# Patient Record
Sex: Female | Born: 1970 | Race: White | Hispanic: No | Marital: Married | Smoking: Former smoker
Health system: Southern US, Community
[De-identification: ages and names within clinical notes are randomized; demographics above are authoritative.]

## PROBLEM LIST (undated history)

## (undated) DIAGNOSIS — K219 Gastro-esophageal reflux disease without esophagitis: Secondary | ICD-10-CM

## (undated) DIAGNOSIS — T7840XA Allergy, unspecified, initial encounter: Secondary | ICD-10-CM

## (undated) DIAGNOSIS — F419 Anxiety disorder, unspecified: Secondary | ICD-10-CM

## (undated) DIAGNOSIS — F32A Depression, unspecified: Secondary | ICD-10-CM

## (undated) DIAGNOSIS — M5416 Radiculopathy, lumbar region: Secondary | ICD-10-CM

## (undated) DIAGNOSIS — R296 Repeated falls: Secondary | ICD-10-CM

## (undated) DIAGNOSIS — G8929 Other chronic pain: Secondary | ICD-10-CM

## (undated) DIAGNOSIS — R519 Headache, unspecified: Secondary | ICD-10-CM

## (undated) DIAGNOSIS — K760 Fatty (change of) liver, not elsewhere classified: Secondary | ICD-10-CM

## (undated) HISTORY — PX: ABDOMINAL HYSTERECTOMY: SHX81

## (undated) HISTORY — DX: Allergy, unspecified, initial encounter: T78.40XA

## (undated) HISTORY — PX: COLPOSCOPY: SHX161

## (undated) HISTORY — PX: FOOT SURGERY: SHX648

## (undated) HISTORY — PX: TUBAL LIGATION: SHX77

---

## 2009-05-01 DIAGNOSIS — C539 Malignant neoplasm of cervix uteri, unspecified: Secondary | ICD-10-CM

## 2009-05-01 HISTORY — DX: Malignant neoplasm of cervix uteri, unspecified: C53.9

## 2012-04-15 ENCOUNTER — Emergency Department: Payer: Self-pay | Admitting: Emergency Medicine

## 2015-07-22 ENCOUNTER — Emergency Department
Admission: EM | Admit: 2015-07-22 | Discharge: 2015-07-22 | Disposition: A | Discharge status: Home / Self Care | Payer: Self-pay | Attending: Emergency Medicine | Admitting: Emergency Medicine

## 2015-07-22 ENCOUNTER — Emergency Department: Payer: Self-pay

## 2015-07-22 ENCOUNTER — Encounter: Payer: Self-pay | Admitting: Emergency Medicine

## 2015-07-22 DIAGNOSIS — F17200 Nicotine dependence, unspecified, uncomplicated: Secondary | ICD-10-CM | POA: Insufficient documentation

## 2015-07-22 DIAGNOSIS — Z3202 Encounter for pregnancy test, result negative: Secondary | ICD-10-CM | POA: Insufficient documentation

## 2015-07-22 DIAGNOSIS — Z880 Allergy status to penicillin: Secondary | ICD-10-CM | POA: Insufficient documentation

## 2015-07-22 DIAGNOSIS — B349 Viral infection, unspecified: Secondary | ICD-10-CM | POA: Insufficient documentation

## 2015-07-22 LAB — CBC
HEMATOCRIT: 45.7 % (ref 35.0–47.0)
Hemoglobin: 15.5 g/dL (ref 12.0–16.0)
MCH: 32.8 pg (ref 26.0–34.0)
MCHC: 34 g/dL (ref 32.0–36.0)
MCV: 96.6 fL (ref 80.0–100.0)
PLATELETS: 177 10*3/uL (ref 150–440)
RBC: 4.73 MIL/uL (ref 3.80–5.20)
RDW: 13.9 % (ref 11.5–14.5)
WBC: 6.6 10*3/uL (ref 3.6–11.0)

## 2015-07-22 LAB — URINALYSIS COMPLETE WITH MICROSCOPIC (ARMC ONLY)
BACTERIA UA: NONE SEEN
BILIRUBIN URINE: NEGATIVE
GLUCOSE, UA: NEGATIVE mg/dL
LEUKOCYTES UA: NEGATIVE
NITRITE: NEGATIVE
Protein, ur: NEGATIVE mg/dL
SPECIFIC GRAVITY, URINE: 1.019 (ref 1.005–1.030)
pH: 5 (ref 5.0–8.0)

## 2015-07-22 LAB — RAPID INFLUENZA A&B ANTIGENS: Influenza A (ARMC): NEGATIVE

## 2015-07-22 LAB — COMPREHENSIVE METABOLIC PANEL
ALK PHOS: 36 U/L — AB (ref 38–126)
ALT: 22 U/L (ref 14–54)
AST: 26 U/L (ref 15–41)
Albumin: 4.7 g/dL (ref 3.5–5.0)
Anion gap: 7 (ref 5–15)
BILIRUBIN TOTAL: 0.7 mg/dL (ref 0.3–1.2)
BUN: 12 mg/dL (ref 6–20)
CALCIUM: 8.9 mg/dL (ref 8.9–10.3)
CO2: 22 mmol/L (ref 22–32)
Chloride: 106 mmol/L (ref 101–111)
Creatinine, Ser: 0.63 mg/dL (ref 0.44–1.00)
GFR calc Af Amer: >60 mL/min (ref >60)
GLUCOSE: 102 mg/dL — AB (ref 65–99)
POTASSIUM: 3.2 mmol/L — AB (ref 3.5–5.1)
Sodium: 135 mmol/L (ref 135–145)
TOTAL PROTEIN: 8.7 g/dL — AB (ref 6.5–8.1)

## 2015-07-22 LAB — RAPID INFLUENZA A&B ANTIGENS (ARMC ONLY): INFLUENZA B (ARMC): NEGATIVE

## 2015-07-22 LAB — POCT PREGNANCY, URINE: Preg Test, Ur: NEGATIVE

## 2015-07-22 LAB — LIPASE, BLOOD: Lipase: 19 U/L (ref 11–51)

## 2015-07-22 MED ORDER — MORPHINE SULFATE (PF) 4 MG/ML IV SOLN
4.0000 mg | Freq: Once | INTRAVENOUS | Status: AC
  Administered 2015-07-22: 4 mg via INTRAVENOUS
  Filled 2015-07-22: qty 1

## 2015-07-22 MED ORDER — ONDANSETRON HCL 4 MG/2ML IJ SOLN
4.0000 mg | Freq: Once | INTRAMUSCULAR | Status: AC
  Administered 2015-07-22: 4 mg via INTRAVENOUS
  Filled 2015-07-22: qty 2

## 2015-07-22 MED ORDER — SODIUM CHLORIDE 0.9 % IV SOLN
Freq: Once | INTRAVENOUS | Status: AC
  Administered 2015-07-22: 12:00:00 via INTRAVENOUS

## 2015-07-22 MED ORDER — PROMETHAZINE HCL 25 MG PO TABS: 25.0000 mg | ORAL_TABLET | Freq: Four times a day (QID) | ORAL | Status: DC | PRN

## 2015-07-22 MED ORDER — KETOROLAC TROMETHAMINE 30 MG/ML IJ SOLN
30.0000 mg | Freq: Once | INTRAMUSCULAR | Status: AC
  Administered 2015-07-22: 30 mg via INTRAVENOUS
  Filled 2015-07-22: qty 1

## 2015-07-22 MED ORDER — IBUPROFEN 800 MG PO TABS: 800.0000 mg | ORAL_TABLET | Freq: Three times a day (TID) | ORAL | Status: DC | PRN

## 2015-07-22 NOTE — Discharge Instructions (Signed)
Viral Infections °A viral infection can be caused by different types of viruses. Most viral infections are not serious and resolve on their own. However, some infections may cause severe symptoms and may lead to further complications. °SYMPTOMS °Viruses can frequently cause: °· Minor sore throat. °· Aches and pains. °· Headaches. °· Runny nose. °· Different types of rashes. °· Watery eyes. °· Tiredness. °· Cough. °· Loss of appetite. °· Gastrointestinal infections, resulting in nausea, vomiting, and diarrhea. °These symptoms do not respond to antibiotics because the infection is not caused by bacteria. However, you might catch a bacterial infection following the viral infection. This is sometimes called a "superinfection." Symptoms of such a bacterial infection may include: °· Worsening sore throat with pus and difficulty swallowing. °· Swollen neck glands. °· Chills and a high or persistent fever. °· Severe headache. °· Tenderness over the sinuses. °· Persistent overall ill feeling (malaise), muscle aches, and tiredness (fatigue). °· Persistent cough. °· Yellow, green, or brown mucus production with coughing. °HOME CARE INSTRUCTIONS  °· Only take over-the-counter or prescription medicines for pain, discomfort, diarrhea, or fever as directed by your caregiver. °· Drink enough water and fluids to keep your urine clear or pale yellow. Sports drinks can provide valuable electrolytes, sugars, and hydration. °· Get plenty of rest and maintain proper nutrition. Soups and broths with crackers or rice are fine. °SEEK IMMEDIATE MEDICAL CARE IF:  °· You have severe headaches, shortness of breath, chest pain, neck pain, or an unusual rash. °· You have uncontrolled vomiting, diarrhea, or you are unable to keep down fluids. °· You or your child has an oral temperature above 102° F (38.9° C), not controlled by medicine. °· Your baby is older than 3 months with a rectal temperature of 102° F (38.9° C) or higher. °· Your baby is 3  months old or younger with a rectal temperature of 100.4° F (38° C) or higher. °MAKE SURE YOU:  °· Understand these instructions. °· Will watch your condition. °· Will get help right away if you are not doing well or get worse. °  °This information is not intended to replace advice given to you by your health care provider. Make sure you discuss any questions you have with your health care provider. °  °Document Released: 01/25/2005 Document Revised: 07/10/2011 Document Reviewed: 09/23/2014 °Elsevier Interactive Patient Education ©2016 [employer] ° °

## 2015-07-22 NOTE — ED Provider Notes (Signed)
[facility] Emergency Department Provider Note     Time seen: ----------------------------------------- 11:28 AM on 07/22/2015 -----------------------------------------    I have reviewed the triage vital signs and the nursing notes.   HISTORY  Chief Complaint Fever; Emesis; Headache; and Generalized Body Aches    HPI Erin Dorsey is a 45 y.o. female who presents ER for vomiting, fever, body aches and headaches since Monday.Patient states she's been unable to tolerate anything by mouth for several days, feels week. Headache is on the right side of her head, nothing makes it better or worse. She has had a fever up to 101, denies other complaints at this time.   History reviewed. No pertinent past medical history.  There are no active problems to display for this patient.   History reviewed. No pertinent past surgical history.  Allergies Penicillins  Social History Social History  Substance Use Topics  . Smoking status: Current Every Day Smoker  . Smokeless tobacco: None  . Alcohol Use: No    Review of Systems Constitutional: Positive for fever and aches Eyes: Negative for visual changes. ENT: Negative for sore throat. Cardiovascular: Negative for chest pain. Respiratory: Negative for shortness of breath. Positive for cough Gastrointestinal: Negative for abdominal pain, positive for vomiting and diarrhea Genitourinary: Negative for dysuria. Musculoskeletal: Negative for back pain. Skin: Negative for rash. Neurological: Positive for headache  10-point ROS otherwise negative.  ____________________________________________   PHYSICAL EXAM:  VITAL SIGNS: ED Triage Vitals  Enc Vitals Group     BP 07/22/15 0818 152/88 mmHg     Pulse Rate 07/22/15 0818 87     Resp 07/22/15 0818 20     Temp 07/22/15 0818 97.8 F (36.6 C)     Temp Source 07/22/15 0818 Oral     SpO2 07/22/15 0818 97 %     Weight 07/22/15 0818 225 lb (102.059 kg)      Height 07/22/15 0818 5\' 8"  (1.727 m)     Head Cir --      Peak Flow --      Pain Score 07/22/15 0819 9     Pain Loc --      Pain Edu? --      Excl. in [place]? --     Constitutional: Alert and oriented. Well appearing and in no distress. Eyes: Conjunctivae are normal. PERRL. Normal extraocular movements. ENT   Head: Normocephalic and atraumatic.   Nose: No congestion/rhinnorhea.   Mouth/Throat: Mucous membranes are moist.   Neck: No stridor. Cardiovascular: Normal rate, regular rhythm. Normal and symmetric distal pulses are present in all extremities. No murmurs, rubs, or gallops. Respiratory: Normal respiratory effort without tachypnea nor retractions. Breath sounds are clear and equal bilaterally. No wheezes/rales/rhonchi. Gastrointestinal: Soft and nontender. No distention. No abdominal bruits.  Musculoskeletal: Nontender with normal range of motion in all extremities. No joint effusions.  No lower extremity tenderness nor edema. Neurologic:  Normal speech and language. No gross focal neurologic deficits are appreciated.  Skin:  Skin is warm, dry and intact. No rash noted. Psychiatric: Mood and affect are normal. Speech and behavior are normal. Patient exhibits appropriate insight and judgment. ____________________________________________  ED COURSE:  Pertinent labs & imaging results that were available during my care of the patient were reviewed by me and considered in my medical decision making (see chart for details). Patient is in no acute distress, will check basic labs, give IV fluids and antiemetics. ____________________________________________    LABS (pertinent positives/negatives)  Labs Reviewed  COMPREHENSIVE  METABOLIC PANEL - Abnormal; Notable for the following:    Potassium 3.2 (*)    Glucose, Bld 102 (*)    Total Protein 8.7 (*)    Alkaline Phosphatase 36 (*)    All other components within normal limits  URINALYSIS COMPLETEWITH MICROSCOPIC ([facility]  ONLY) - Abnormal; Notable for the following:    Color, Urine YELLOW (*)    APPearance CLEAR (*)    Ketones, ur 2+ (*)    Hgb urine dipstick 2+ (*)    Squamous Epithelial / LPF 0-5 (*)    All other components within normal limits  RAPID INFLUENZA A&B ANTIGENS ([facility] ONLY)  LIPASE, BLOOD  CBC  POC URINE PREG, ED  POCT PREGNANCY, URINE    RADIOLOGY Chest x-ray Is unremarkable  ____________________________________________  FINAL ASSESSMENT AND PLAN  Viral syndrome  Plan: Patient with labs and imaging as dictated above. Patient is currently feeling better, will be discharged with Motrin, Phenergan and outpatient follow-up with her doctor.   Earleen Newport, MD   Earleen Newport, MD 07/22/15 [id number]

## 2015-07-22 NOTE — ED Notes (Signed)
Pt to ed with c/o vomiting, fever, body aches, headache since Monday.

## 2019-02-02 ENCOUNTER — Other Ambulatory Visit: Payer: Self-pay

## 2019-02-02 ENCOUNTER — Emergency Department
Admission: EM | Admit: 2019-02-02 | Discharge: 2019-02-03 | Disposition: A | Discharge status: Home / Self Care | Payer: Self-pay | Attending: Emergency Medicine | Admitting: Emergency Medicine

## 2019-02-02 DIAGNOSIS — R519 Headache, unspecified: Secondary | ICD-10-CM | POA: Insufficient documentation

## 2019-02-02 DIAGNOSIS — Z5321 Procedure and treatment not carried out due to patient leaving prior to being seen by health care provider: Secondary | ICD-10-CM | POA: Insufficient documentation

## 2019-02-02 HISTORY — DX: Headache, unspecified: R51.9

## 2019-02-02 HISTORY — DX: Other chronic pain: G89.29

## 2019-02-02 NOTE — ED Triage Notes (Addendum)
Pt to the er for headache, nausea, vomiting, a pinched nerve and pain running down her side and a cough. Pt also has left ear pain. Pt has a hx of allergies. Pt took cold medicine PTA. Pt states she does not have a MD so she is not taking any meds at this time.

## 2019-02-03 NOTE — ED Notes (Signed)
Patient called, no answer. Not seen in lobby.  

## 2019-02-03 NOTE — ED Notes (Signed)
Patient called, no answer. Patient not seen in lobby

## 2019-02-03 NOTE — ED Notes (Signed)
Patient called, no answer. Patient not seen in lobby, bathroom, or outside.  

## 2019-02-20 ENCOUNTER — Ambulatory Visit: Payer: Self-pay

## 2019-02-25 ENCOUNTER — Ambulatory Visit: Payer: Self-pay

## 2019-08-06 ENCOUNTER — Encounter: Payer: Self-pay | Admitting: Family Medicine

## 2019-08-06 ENCOUNTER — Ambulatory Visit (INDEPENDENT_AMBULATORY_CARE_PROVIDER_SITE_OTHER)
Admission: RE | Admit: 2019-08-06 | Discharge: 2019-08-06 | Disposition: A | Discharge status: Home / Self Care | Payer: BC Managed Care – PPO | Source: Ambulatory Visit | Attending: Family Medicine | Admitting: Family Medicine

## 2019-08-06 ENCOUNTER — Ambulatory Visit: Payer: BC Managed Care – PPO | Admitting: Family Medicine

## 2019-08-06 ENCOUNTER — Other Ambulatory Visit: Payer: Self-pay

## 2019-08-06 VITALS — BP 118/90 | HR 75 | Temp 98.3°F | Resp 18 | Ht 67.25 in | Wt 213.2 lb

## 2019-08-06 DIAGNOSIS — Z13220 Encounter for screening for lipoid disorders: Secondary | ICD-10-CM | POA: Diagnosis not present

## 2019-08-06 DIAGNOSIS — K148 Other diseases of tongue: Secondary | ICD-10-CM | POA: Diagnosis not present

## 2019-08-06 DIAGNOSIS — E669 Obesity, unspecified: Secondary | ICD-10-CM

## 2019-08-06 DIAGNOSIS — M25551 Pain in right hip: Secondary | ICD-10-CM

## 2019-08-06 DIAGNOSIS — Z131 Encounter for screening for diabetes mellitus: Secondary | ICD-10-CM

## 2019-08-06 DIAGNOSIS — I10 Essential (primary) hypertension: Secondary | ICD-10-CM

## 2019-08-06 DIAGNOSIS — Z23 Encounter for immunization: Secondary | ICD-10-CM

## 2019-08-06 DIAGNOSIS — Z8541 Personal history of malignant neoplasm of cervix uteri: Secondary | ICD-10-CM

## 2019-08-06 DIAGNOSIS — R221 Localized swelling, mass and lump, neck: Secondary | ICD-10-CM

## 2019-08-06 DIAGNOSIS — E66811 Obesity, class 1: Secondary | ICD-10-CM

## 2019-08-06 DIAGNOSIS — Z720 Tobacco use: Secondary | ICD-10-CM

## 2019-08-06 LAB — LIPID PANEL
Cholesterol: 149 mg/dL (ref 0–200)
HDL: 33.1 mg/dL — ABNORMAL LOW (ref >39.00)
LDL Cholesterol: 94 mg/dL (ref 0–99)
NonHDL: 116.29
Total CHOL/HDL Ratio: 5
Triglycerides: 113 mg/dL (ref 0.0–149.0)
VLDL: 22.6 mg/dL (ref 0.0–40.0)

## 2019-08-06 LAB — CBC WITH DIFFERENTIAL/PLATELET
Basophils Absolute: 0 10*3/uL (ref 0.0–0.1)
Basophils Relative: 0.8 % (ref 0.0–3.0)
Eosinophils Absolute: 0.1 10*3/uL (ref 0.0–0.7)
Eosinophils Relative: 1.1 % (ref 0.0–5.0)
HCT: 43.1 % (ref 36.0–46.0)
Hemoglobin: 14.3 g/dL (ref 12.0–15.0)
Lymphocytes Relative: 31.1 % (ref 12.0–46.0)
Lymphs Abs: 1.5 10*3/uL (ref 0.7–4.0)
MCHC: 33.3 g/dL (ref 30.0–36.0)
MCV: 102 fl — ABNORMAL HIGH (ref 78.0–100.0)
Monocytes Absolute: 0.3 10*3/uL (ref 0.1–1.0)
Monocytes Relative: 7.1 % (ref 3.0–12.0)
Neutro Abs: 2.8 10*3/uL (ref 1.4–7.7)
Neutrophils Relative %: 59.9 % (ref 43.0–77.0)
Platelets: 161 10*3/uL (ref 150.0–400.0)
RBC: 4.22 Mil/uL (ref 3.87–5.11)
RDW: 13.2 % (ref 11.5–15.5)
WBC: 4.7 10*3/uL (ref 4.0–10.5)

## 2019-08-06 LAB — COMPREHENSIVE METABOLIC PANEL
ALT: 16 U/L (ref 0–35)
AST: 18 U/L (ref 0–37)
Albumin: 4.2 g/dL (ref 3.5–5.2)
Alkaline Phosphatase: 32 U/L — ABNORMAL LOW (ref 39–117)
BUN: 6 mg/dL (ref 6–23)
CO2: 25 mEq/L (ref 19–32)
Calcium: 9.1 mg/dL (ref 8.4–10.5)
Chloride: 109 mEq/L (ref 96–112)
Creatinine, Ser: 0.7 mg/dL (ref 0.40–1.20)
GFR: 89.68 mL/min (ref >60.00)
Glucose, Bld: 74 mg/dL (ref 70–99)
Potassium: 3.5 mEq/L (ref 3.5–5.1)
Sodium: 143 mEq/L (ref 135–145)
Total Bilirubin: 0.5 mg/dL (ref 0.2–1.2)
Total Protein: 7.2 g/dL (ref 6.0–8.3)

## 2019-08-06 LAB — POCT GLYCOSYLATED HEMOGLOBIN (HGB A1C): Hemoglobin A1C: 5.4 % (ref 4.0–5.6)

## 2019-08-06 NOTE — Assessment & Plan Note (Signed)
Did not have time for PAP today. Return for PAP and annual exam next week.

## 2019-08-06 NOTE — Assessment & Plan Note (Signed)
Interested in cessation, did not have time to address today. Will continue to encourage.

## 2019-08-06 NOTE — Assessment & Plan Note (Signed)
BP mildly elevated today and on previous checks. Return for wellness. Blood work today. Borderline so will encourage life style changes first. Also treat hip pain

## 2019-08-06 NOTE — Assessment & Plan Note (Signed)
In setting of tobacco user with also tongue lesion concerning for possible cancer. Referral to ENT for tongue lesion and neck findings for further work-up

## 2019-08-06 NOTE — Assessment & Plan Note (Signed)
XR reassuring w/o severe arthritis. Recommended trial of PT for evaluation and strengthening to see if that improves symptoms

## 2019-08-06 NOTE — Assessment & Plan Note (Signed)
ENT referral. For tongue lesion and neck nodules

## 2019-08-06 NOTE — Patient Instructions (Addendum)
Great to meet you!  #Referral I have placed a referral to a specialist for you. You should receive a phone call from the specialty office. Make sure your voicemail is not full and that if you are able to answer your phone to unknown or new numbers.   It may take up to 2 weeks to hear about the referral. If you do not hear anything in 2 weeks, please call our office and ask to speak with the referral coordinator.   #Tongue lesion and mouth lesion - Referral to ENT  #History of Cancer - Return in 1-2 weeks for annual with pap smear - if abnormal we will send you to the GYN  #Hip pain - X-ray today looked normal - given your pain, I would recommend a physical therapy referral

## 2019-08-06 NOTE — Progress Notes (Signed)
Subjective:     Erin Dorsey is a 49 y.o. female presenting for Establish Care (no previous PCP in about 15 years), Hip Pain (right. Sometimes has numbness in her legs also. Feels some knots off and on in her legs.), and Nodules on neck area (right side)     HPI  #Cervical Cancer - last diagnosed on 2011 with procedure - was suppose to go back for repeat pap but never did -   #hip/leg pain - once a week marijuana for pain - has several knots in the muscle area - gets the pain in the lower back and it travels to the upper thigh - twice last month had trouble moving the legs - worse with standing or walking - when it starts it is a stabbing pain and she has to hold her leg in a specific position  #neck nodules - in the neck - for years - one is more recent - occasionally tender and will have discharge occasionally - the front one is not painful - when drinking she will get pain in the neck lesion - when she drinks sour things it will release and be more painful -- but this causes it to decrease in size  #tongue lesion - started 1.5 years ago - dentist recommended ENT - but insurance nov 2020 - only has 4 teeth - getting larger with time  Review of Systems   Social History   Tobacco Use  Smoking Status Current Some Day Smoker  . Packs/day: 0.25  . Years: 20.00  . Pack years: 5.00  . Types: Cigarettes  Smokeless Tobacco Never Used  Tobacco Comment   not daily        Objective:    BP Readings from Last 3 Encounters:  08/06/19 118/90  02/02/19 (!) 149/78  07/22/15 137/83   Wt Readings from Last 3 Encounters:  08/06/19 213 lb 4 oz (96.7 kg)  02/02/19 260 lb (117.9 kg)  07/22/15 225 lb (102.1 kg)    BP 118/90   Pulse 75   Temp 98.3 F (36.8 C)   Resp 18   Ht 5' 7.25" (1.708 m)   Wt 213 lb 4 oz (96.7 kg)   LMP 06/23/2015   SpO2 97%   BMI 33.15 kg/m    Physical Exam Constitutional:      General: She is not in acute distress.  Appearance: She is well-developed. She is not diaphoretic.  HENT:     Head:     Comments: Very few teeth present. Notes some TTP in the right upper jaw. Nodule lesion non-tender on the right submandibular area.     Right Ear: External ear normal.     Left Ear: External ear normal.     Nose: Nose normal.     Mouth/Throat:     Tongue: Lesions (papular lesion on the left tongue) present.  Eyes:     Conjunctiva/sclera: Conjunctivae normal.  Cardiovascular:     Rate and Rhythm: Normal rate and regular rhythm.     Heart sounds: No murmur.  Pulmonary:     Effort: Pulmonary effort is normal. No respiratory distress.     Breath sounds: Normal breath sounds. No wheezing.  Musculoskeletal:     Cervical back: Neck supple.     Comments: Hip:  Inspection: no abnormalities Palpation: tenderness out of proportion to exam along the lateral hip and lower lumbar area.  ROM: limited 2/2 to lateral hip pain Strength: hip flexors 3/5 - needing to use her  hand to lift her leg 2/2 to pain  Skin:    General: Skin is warm and dry.     Capillary Refill: Capillary refill takes less than 2 seconds.  Neurological:     Mental Status: She is alert. Mental status is at baseline.  Psychiatric:        Mood and Affect: Mood normal.        Behavior: Behavior normal.     XR: no acute findings on my read.       Assessment & Plan:   Problem List Items Addressed This Visit      Cardiovascular and Mediastinum   Essential hypertension    BP mildly elevated today and on previous checks. Return for wellness. Blood work today. Borderline so will encourage life style changes first. Also treat hip pain      Relevant Orders   TSH   Comprehensive metabolic panel   CBC with Differential     Other   Tongue lesion - Primary    ENT referral. For tongue lesion and neck nodules      Relevant Orders   Ambulatory referral to ENT   Tobacco use    Interested in cessation, did not have time to address today. Will  continue to encourage.       Hx of cervical cancer    Did not have time for PAP today. Return for PAP and annual exam next week.       Right hip pain    XR reassuring w/o severe arthritis. Recommended trial of PT for evaluation and strengthening to see if that improves symptoms      Relevant Orders   Ambulatory referral to Physical Therapy   DG Hip Unilat W OR W/O Pelvis 2-3 Views Right   Obesity (BMI 30.0-34.9)   Relevant Orders   TSH   Lipid panel   Comprehensive metabolic panel   Nodule of neck    In setting of tobacco user with also tongue lesion concerning for possible cancer. Referral to ENT for tongue lesion and neck findings for further work-up       Other Visit Diagnoses    Screening for hyperlipidemia       Relevant Orders   Lipid panel   Screening for diabetes mellitus (DM)       Relevant Orders   POCT HgB A1C (Completed)       Return in about 2 weeks (around 08/20/2019) for for annual with PAP.  Erin Noe, MD

## 2019-08-12 ENCOUNTER — Other Ambulatory Visit: Payer: Self-pay | Admitting: Otolaryngology

## 2019-08-12 DIAGNOSIS — D3702 Neoplasm of uncertain behavior of tongue: Secondary | ICD-10-CM | POA: Diagnosis not present

## 2019-08-12 DIAGNOSIS — R221 Localized swelling, mass and lump, neck: Secondary | ICD-10-CM | POA: Diagnosis not present

## 2019-08-15 DIAGNOSIS — M545 Low back pain: Secondary | ICD-10-CM | POA: Diagnosis not present

## 2019-08-15 DIAGNOSIS — M25551 Pain in right hip: Secondary | ICD-10-CM | POA: Diagnosis not present

## 2019-08-19 ENCOUNTER — Other Ambulatory Visit: Payer: Self-pay | Admitting: Otolaryngology

## 2019-08-19 ENCOUNTER — Encounter: Payer: BC Managed Care – PPO | Admitting: Family Medicine

## 2019-08-19 DIAGNOSIS — R221 Localized swelling, mass and lump, neck: Secondary | ICD-10-CM

## 2019-08-20 ENCOUNTER — Ambulatory Visit: Admission: RE | Admit: 2019-08-20 | Payer: BC Managed Care – PPO | Source: Ambulatory Visit

## 2019-08-22 DIAGNOSIS — M25551 Pain in right hip: Secondary | ICD-10-CM | POA: Diagnosis not present

## 2019-08-22 DIAGNOSIS — M545 Low back pain: Secondary | ICD-10-CM | POA: Diagnosis not present

## 2019-08-25 ENCOUNTER — Other Ambulatory Visit (HOSPITAL_COMMUNITY)
Admission: RE | Admit: 2019-08-25 | Discharge: 2019-08-25 | Disposition: A | Discharge status: Home / Self Care | Payer: BC Managed Care – PPO | Source: Ambulatory Visit | Attending: Family Medicine | Admitting: Family Medicine

## 2019-08-25 ENCOUNTER — Other Ambulatory Visit: Payer: Self-pay

## 2019-08-25 ENCOUNTER — Ambulatory Visit (INDEPENDENT_AMBULATORY_CARE_PROVIDER_SITE_OTHER): Payer: BC Managed Care – PPO | Admitting: Family Medicine

## 2019-08-25 ENCOUNTER — Encounter: Payer: Self-pay | Admitting: Family Medicine

## 2019-08-25 VITALS — BP 120/82 | HR 84 | Temp 98.2°F | Resp 20 | Ht 67.25 in | Wt 211.5 lb

## 2019-08-25 DIAGNOSIS — Z124 Encounter for screening for malignant neoplasm of cervix: Secondary | ICD-10-CM

## 2019-08-25 DIAGNOSIS — Z8541 Personal history of malignant neoplasm of cervix uteri: Secondary | ICD-10-CM | POA: Diagnosis not present

## 2019-08-25 DIAGNOSIS — Z0000 Encounter for general adult medical examination without abnormal findings: Secondary | ICD-10-CM

## 2019-08-25 NOTE — Assessment & Plan Note (Signed)
Discussed presence of white lesion on cervix and that pending the results of the pap smear I would like her to see GYN for further evaluation. She was agreeable. Will wait for pap results prior to making referral

## 2019-08-25 NOTE — Patient Instructions (Signed)
Menopause Menopause may increase your risk for:  Loss of bone (osteoporosis), which causes bone breaks (fractures).  Depression.  Hardening and narrowing of the arteries (atherosclerosis), which can cause heart attacks and strokes. What are the causes? This condition is usually caused by a natural change in hormone levels that happens as you get older. The condition may also be caused by surgery to remove both ovaries (bilateral oophorectomy). Follow these instructions at home: Lifestyle  Do not use any products that contain nicotine or tobacco, such as cigarettes and e-cigarettes. If you need help quitting, ask your health care provider.  Get at least 30 minutes of physical activity on 5 or more days each week.  Avoid alcoholic and caffeinated beverages, as well as spicy foods. This may help prevent hot flashes.  Get 7-8 hours of sleep each night.  If you have hot flashes, try: ? Dressing in layers. ? Avoiding things that may trigger hot flashes, such as spicy food, hot drinks, alcohol, caffeine, warm places, or stress. ? Taking slow, deep breaths when a hot flash starts. ? Keeping a fan in your home and office.  Find ways to manage stress: regular exercise, meditation, yoga, qigong, Tai Chi, biofeedback, acupuncture or massage  Consider going to group therapy with other women who are having menopause symptoms. Ask your health care provider about recommended group therapy meetings.  Staying cool while sleeping: dress in light clothing, use layed bedding that can be easily removed, Sleep with a fan nearby, put an ice pack under your pillow and flip your pillow regularly  Eating and drinking  Eat a healthy, balanced diet that contains whole grains, lean protein, low-fat dairy, and plenty of fruits and vegetables.  Your health care provider may recommend adding more soy to your diet. Foods that contain soy include tofu, tempeh, and soy milk.  Eat plenty of foods that contain  calcium and vitamin D for bone health. Items that are rich in calcium include low-fat milk, yogurt, beans, almonds, sardines, broccoli, and kale. Medicines  Non-prescription medications for Hot Flashes ? Soy - eat 1-2 servings of soy foods daily ? Herbs: like black cohosh have shown some improvement with hot flashes  Talk with your health care provider before starting any herbal supplements. If prescribed, take vitamins and supplements as told by your health care provider. These may include: ? Calcium. Women age 53 and older should get 1,200 mg (milligrams) of calcium every day. ? Vitamin D. Women need 600-800 International Units of vitamin D each day.  Prescription Medications ? Hormone Treatment: increased risk of breast cancer and cardiovascular disease. Should be used for a short time and in women under 60 have a lower risk overall if they take it ? Non-Hormone Treatment: Paroxetine which is an antidepressant, has been shown to reduce Hot Flashes

## 2019-08-25 NOTE — Progress Notes (Signed)
Annual Exam   Chief Complaint:  Chief Complaint  Patient presents with  . Annual Exam    needs pap smear    History of Present Illness:  [name] is a 49 y.o. No obstetric history on file. who LMP was Patient's last menstrual period was 06/23/2015., presents today for her annual examination.     Nutrition She does get adequate calcium and Vitamin D in her diet. Diet: does not like meat, veggies and fruit and milk  Exercise: has been walking around the yard - husband walks with her - twice a day  Safety The patient wears seatbelts: yes.     The patient feels safe at home and in their relationships: yes.   Menstrual No period in 2 years Endorses hot flashes - will put her head in the freezer  GYN She is single partner, contraception - post menopausal status.    Cervical Cancer Screening:   Last Pap: almost 10 years ago, hx of cancer  Breast Cancer Screening There is FH of breast cancer. There is no FH of ovarian cancer. BRCA screening Not Indicated.  Discussed that for average risk women between age 26-49 screening may reduce the risk of breast cancer death, however, at a lower rate than those over age 43. And that the the false-positive rates resulting in unnecessary biopsies with more screening is higher. The balance of benefits vs harms likely improves as you progress through your 40s. The patient does not want a mammogram this year.   Weight Wt Readings from Last 3 Encounters:  08/25/19 211 lb 8 oz (95.9 kg)  08/06/19 213 lb 4 oz (96.7 kg)  02/02/19 260 lb (117.9 kg)   Patient has high BMI  BMI Readings from Last 1 Encounters:  08/25/19 32.88 kg/m     Chronic disease screening Blood pressure monitoring:  BP Readings from Last 3 Encounters:  08/25/19 120/82  08/06/19 118/90  02/02/19 (!) 149/78    Lipid Monitoring: Indication for screening: age >9, obesity, diabetes, family hx, CV risk factors.  Lipid screening: Yes  Lab Results   Component Value Date   CHOL 149 08/06/2019   HDL 33.10 (L) 08/06/2019   LDLCALC 94 08/06/2019   TRIG 113.0 08/06/2019   CHOLHDL 5 08/06/2019     Diabetes Screening: age >51, overweight, family hx, PCOS, hx of gestational diabetes, at risk ethnicity Diabetes Screening screening: Yes  Lab Results  Component Value Date   HGBA1C 5.4 08/06/2019     Past Medical History:  Diagnosis Date  . Allergy   . Cervical cancer ([place]) 2011   stage 3  . Chronic headaches     Past Surgical History:  Procedure Laterality Date  . COLPOSCOPY  2011 or 2012   stage 3-removed cancerous spot on the cervix  . FOOT SURGERY Right    x 2-took knots out from bottom of the foot-not sure of the technical name  . TUBAL LIGATION      Prior to Admission medications   Medication Sig Start Date End Date Taking? Authorizing Provider  acetaminophen (TYLENOL) 325 MG tablet Take 650 mg by mouth every 6 (six) hours as needed.   Yes [provider]  ibuprofen (ADVIL) 200 MG tablet Take 200 mg by mouth every 6 (six) hours as needed.   Yes [provider]  OVER THE COUNTER MEDICATION VITAMINS FOR IMMUNE SYSTEM   Yes [provider]    Allergies  Allergen Reactions  . Penicillins Rash  Gynecologic History: Patient's last menstrual period was 06/23/2015.  Obstetric History: No obstetric history on file.  Social History   Socioeconomic History  . Marital status: Married    Spouse name: [name]  . Number of children: 3  . Years of education: 7th grade  . Highest education level: Not on file  Occupational History  . Not on file  Tobacco Use  . Smoking status: Current Some Day Smoker    Packs/day: 0.25    Years: 20.00    Pack years: 5.00    Types: Cigarettes  . Smokeless tobacco: Never Used  . Tobacco comment: not daily  Substance and Sexual Activity  . 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 Drug use: Yes    Frequency: 1.0 times per week    Types: Marijuana   . Sexual activity: Yes    Birth control/protection: Surgical  Other Topics Concern  . Not on file  Social History Narrative   08/06/19   From: [place], came here to be close to daughter   Living: with husband [name] together since 2011 and married since 2021   Work: not currently, use to work at [employer]: 3 daughter - in [place] - ok relationship - 1 grandchild / with [name] has 18 step grandchildren      Enjoys: walk, watch movies, painting, cross stitch      Exercise: walking   Diet: eats whatever      Safety   Seat belts: Yes    Guns: No   Safe in relationships: Yes    Social Determinants of Radio broadcast assistant Strain:   . Difficulty of Paying Living Expenses:   Food Insecurity:   . Worried About Charity fundraiser in the Last Year:   . Arboriculturist in the Last Year:   Transportation Needs:   . Film/video editor (Medical):   [name] Lack of Transportation (Non-Medical):   Physical Activity:   . Days of Exercise per Week:   . Minutes of Exercise per Session:   Stress:   . Feeling of Stress :   Social Connections:   . Frequency of Communication with Friends and Family:   . Frequency of Social Gatherings with Friends and Family:   . Attends Religious Services:   . Active Member of Clubs or Organizations:   . Attends Archivist Meetings:   [name] Marital Status:   Intimate Partner Violence:   . Fear of Current or Ex-Partner:   . Emotionally Abused:   [name] Physically Abused:   . Sexually Abused:     Family History  Problem Relation Age of Onset  . Hypertension Mother   . Kidney disease Father        was on dialysis  . Uterine cancer Sister   . Cervical cancer Sister   . Deafness Daughter   . Cervical cancer Maternal Grandmother   . Lung cancer Maternal Grandfather   . Brain cancer Maternal Grandfather   . Thyroid disease Daughter   . Breast cancer Paternal Aunt     Review of Systems  Constitutional: Negative for chills and  fever.  HENT: Negative for congestion and sore throat.   Eyes: Negative for blurred vision and double vision.  Respiratory: Negative for shortness of breath.   Cardiovascular: Negative for chest pain.  Gastrointestinal: Negative for heartburn, nausea and vomiting.  Genitourinary: Negative.   Musculoskeletal: Positive for joint pain. Negative for  myalgias.  Skin: Negative for rash.  Neurological: Negative for dizziness and headaches.  Endo/Heme/Allergies: Does not bruise/bleed easily.  Psychiatric/Behavioral: Negative for depression. The patient is not nervous/anxious.      Physical Exam BP 120/82   Pulse 84   Temp 98.2 F (36.8 C)   Resp 20   Ht 5' 7.25" (1.708 m)   Wt 211 lb 8 oz (95.9 kg)   LMP 06/23/2015   SpO2 96%   BMI 32.88 kg/m    BP Readings from Last 3 Encounters:  08/25/19 120/82  08/06/19 118/90  02/02/19 (!) 149/78      Physical Exam Exam conducted with a chaperone present.  Constitutional:      General: She is not in acute distress.    Appearance: She is well-developed. She is not diaphoretic.  HENT:     Head: Normocephalic and atraumatic.     Right Ear: External ear normal.     Left Ear: External ear normal.     Nose: Nose normal.  Eyes:     General: No scleral icterus.    Conjunctiva/sclera: Conjunctivae normal.  Cardiovascular:     Rate and Rhythm: Normal rate and regular rhythm.     Heart sounds: No murmur.  Pulmonary:     Effort: Pulmonary effort is normal. No respiratory distress.     Breath sounds: Normal breath sounds. No wheezing.  Abdominal:     General: Bowel sounds are normal. There is no distension.     Palpations: Abdomen is soft. There is no mass.     Tenderness: There is no abdominal tenderness. There is no guarding or rebound.  Genitourinary:    Comments: White lesion on the cervix near the 12 o'clock position Musculoskeletal:        General: Normal range of motion.     Cervical back: Neck supple.  Lymphadenopathy:      Cervical: No cervical adenopathy.  Skin:    General: Skin is warm and dry.     Capillary Refill: Capillary refill takes less than 2 seconds.  Neurological:     Mental Status: She is alert and oriented to person, place, and time.     Deep Tendon Reflexes: Reflexes normal.  Psychiatric:        Behavior: Behavior normal.     Results:  PHQ-9:   Depression screen PHQ 2/9 08/06/2019  Decreased Interest 1  Down, Depressed, Hopeless 0  PHQ - 2 Score 1       Assessment: 49 y.o. No obstetric history on file. female here for routine annual physical examination.  Plan: Problem List Items Addressed This Visit      Other   Hx of cervical cancer    Discussed presence of white lesion on cervix and that pending the results of the pap smear I would like her to see GYN for further evaluation. She was agreeable. Will wait for pap results prior to making referral       Other Visit Diagnoses    Annual physical exam    -  Primary   Cervical cancer screening       Relevant Orders   Cytology - PAP([place])      Screening: -- Blood pressure screen normal -- cholesterol screening: not due for screening -- Weight screening: overweight: continue to monitor -- Diabetes Screening: not due for screening -- Nutrition: normal - encouraged soy in diet for hot flashes  The 10-year ASCVD risk score Mikey Bussing DC Jr., et al., 2013) is: 3.5%  Values used to calculate the score:     Age: 10 years     Sex: Female     Is Non-Hispanic African American: No     Diabetic: No     Tobacco smoker: Yes     Systolic Blood Pressure: 419 mmHg     Is BP treated: No     HDL Cholesterol: 33.1 mg/dL     Total Cholesterol: 149 mg/dL  -- Statin therapy for Age 54-75 with CVD risk >7.5%  Psych -- Depression screening (PHQ-9):  mildly elevated, pt managing OK  Safety -- tobacco screening: quit last week. Doing well, no cravings. Congrats -- alcohol screening:  low-risk usage. -- no evidence of domestic  violence or intimate partner violence.   Cancer Screening -- pap smear collected per ASCCP guidelines -- family history of breast cancer screening: done. not at high risk. -- Mammogram - will wait another year  Immunizations -- flu vaccine up to date -- TDAP q10 years up to date  Encouraged healthy diet and dental/vision care.     [name], MD

## 2019-08-27 ENCOUNTER — Ambulatory Visit: Admission: RE | Admit: 2019-08-27 | Payer: BC Managed Care – PPO | Source: Ambulatory Visit

## 2019-08-27 DIAGNOSIS — M545 Low back pain: Secondary | ICD-10-CM | POA: Diagnosis not present

## 2019-08-27 DIAGNOSIS — M25551 Pain in right hip: Secondary | ICD-10-CM | POA: Diagnosis not present

## 2019-08-27 LAB — CYTOLOGY - PAP
Comment: NEGATIVE
Diagnosis: NEGATIVE
High risk HPV: NEGATIVE

## 2019-09-04 ENCOUNTER — Other Ambulatory Visit: Payer: Self-pay | Admitting: Otolaryngology

## 2019-09-04 DIAGNOSIS — R221 Localized swelling, mass and lump, neck: Secondary | ICD-10-CM

## 2019-09-04 DIAGNOSIS — M25551 Pain in right hip: Secondary | ICD-10-CM | POA: Diagnosis not present

## 2019-09-04 DIAGNOSIS — M545 Low back pain: Secondary | ICD-10-CM | POA: Diagnosis not present

## 2019-09-05 ENCOUNTER — Ambulatory Visit
Admission: RE | Admit: 2019-09-05 | Discharge: 2019-09-05 | Disposition: A | Discharge status: Home / Self Care | Payer: BC Managed Care – PPO | Source: Ambulatory Visit | Attending: Otolaryngology | Admitting: Otolaryngology

## 2019-09-05 ENCOUNTER — Ambulatory Visit: Payer: BC Managed Care – PPO

## 2019-09-05 ENCOUNTER — Other Ambulatory Visit: Payer: Self-pay

## 2019-09-05 DIAGNOSIS — R221 Localized swelling, mass and lump, neck: Secondary | ICD-10-CM | POA: Insufficient documentation

## 2019-09-05 DIAGNOSIS — K118 Other diseases of salivary glands: Secondary | ICD-10-CM | POA: Diagnosis not present

## 2019-09-05 MED ORDER — IOHEXOL 300 MG/ML  SOLN
75.0000 mL | Freq: Once | INTRAMUSCULAR | Status: AC | PRN
  Administered 2019-09-05: 75 mL via INTRAVENOUS

## 2019-09-08 ENCOUNTER — Encounter: Payer: Self-pay | Admitting: Family Medicine

## 2019-09-09 NOTE — Telephone Encounter (Signed)
Dr Einar Pheasant, this was the pain she was mentioning during her Physical and stated then she was going to P.T but that things were hurting worse and thought it would improve. Does patient need an appointment to follow up?

## 2019-09-10 ENCOUNTER — Ambulatory Visit: Payer: BC Managed Care – PPO | Admitting: Family Medicine

## 2019-09-10 ENCOUNTER — Other Ambulatory Visit: Payer: Self-pay

## 2019-09-10 ENCOUNTER — Encounter: Payer: Self-pay | Admitting: Family Medicine

## 2019-09-10 VITALS — BP 130/98 | HR 68 | Temp 97.9°F | Ht 67.25 in | Wt 212.5 lb

## 2019-09-10 DIAGNOSIS — R208 Other disturbances of skin sensation: Secondary | ICD-10-CM | POA: Diagnosis not present

## 2019-09-10 DIAGNOSIS — M5416 Radiculopathy, lumbar region: Secondary | ICD-10-CM

## 2019-09-10 DIAGNOSIS — G5701 Lesion of sciatic nerve, right lower limb: Secondary | ICD-10-CM | POA: Diagnosis not present

## 2019-09-10 MED ORDER — CYCLOBENZAPRINE HCL 10 MG PO TABS: 5.0000 mg | ORAL_TABLET | Freq: Every day | ORAL | 1 refills | Status: DC

## 2019-09-10 MED ORDER — PREDNISONE 20 MG PO TABS: ORAL_TABLET | ORAL | 0 refills | Status: DC

## 2019-09-10 NOTE — Progress Notes (Signed)
T. , MD, [place]  [facility] [place] [facility] [place], [address]  Phone: [phone]  FAX: [place] - 49 y.o. female  MRN [id number]  Date of Birth: 11/14/1970  Date: 09/10/2019  PCP: [name], MD  Referral: [name], MD  Chief Complaint  Patient presents with  . Hip Injury    Doing PT-Was told her muscle was attaching her nerve  . Back Pain    This visit occurred during the SARS-CoV-2 public health emergency.  Safety protocols were in place, including screening questions prior to the visit, additional usage of staff PPE, and extensive cleaning of exam room while observing appropriate contact time as indicated for disinfecting solutions.   Subjective:   Erin Dorsey is a 49 y.o. very pleasant female patient with Body mass index is 33.04 kg/m. who presents with the following:  She is a very pleasant young 49 year old lady and she presents with a number of ongoing musculoskeletal issues.  She has complaints of right-sided lower back pain and stiffness and some radiation to her buttocks primarily on the right and to a lesser extent on the left.  She has pain in her sacrum as well.  She denies groin pain, but she describes this as posterior hip pain in the lateral hip pain.  The radiological images were independently reviewed by myself in the office and results were reviewed with the patient. My independent interpretation of images: Joint spaces are preserved without any narrowing or osteophyte formation.  There is no fractures or dislocations.  Grossly normal x-ray of the hip. Electronically Signed  By: Owens Loffler, MD On: 09/10/2019  8:40 AM EDT   She has been doing some physical therapy, but she was having some continued sciatica type symptoms and was recommended to follow-up with me here in the office today.  She was doing physical  therapy for posterior buttocks pain.  At baseline she does have some numbness in her right foot secondary to a prior ankle surgery distantly.[name]  r lumb rad piriformis  Posterior buttocks pain.   Doing PT for posterior hip.  Top of tigh Medial foot - baseline gtb b   Review of Systems is noted in the HPI, as appropriate   Objective:   BP (!) 130/98   Pulse 68   Temp 97.9 F (36.6 C) (Temporal)   Ht 5' 7.25" (1.708 m)   Wt 212 lb 8 oz (96.4 kg)   LMP 06/23/2015   SpO2 97%   BMI 33.04 kg/m   GEN: No acute distress; alert,appropriate. PULM: Breathing comfortably in no respiratory distress PSYCH: Normally interactive.    She has full range of motion at the lumbar spine in all directions, flexion, extension, lateral bending and rotation.  She is able to walk on her tiptoes and on her heels. She does have some very tight paraspinous musculature on the right from L2-S1 as well as to a much of a lesser extent on the left side.  She is notably tender at the sacrum bilaterally.  She is tender quite a bit in her sacroiliac joints bilaterally.  She also is tender along the posterior pelvic rim bilaterally.  She also is tender on the right side at the piriformis.  Strength testing of the lower extremities yields 5/5 throughout. She does have some decreased sensation to pinprick and soft touch on the anterior thigh in addition to  her baseline right-sided foot numbness.  Straight leg raise is negative bilaterally Posterior pelvic pain with Corky Sox and FADIR causes posterior pain.  HIP EXAM: SIDE: Right ROM: Abduction, Flexion, Internal and External range of motion: Full  Log roll is normal Pain with terminal IROM and EROM: Posterior pelvis only GTB: Minimal bilateral  SLR: Posterior pain only  knees: No effusion Piriformis: Tender on the right to direct palpation Str: flexion: 5/5 abduction: 5/5 adduction: 5/5 Strength testing non-tender      Radiology: above  Assessment and Plan:     ICD-10-CM   1. Lumbar radiculopathy, acute  M54.16   2. Piriformis syndrome, right  G57.01   3. Decreased sensation of leg  R20.8    Multifactorial musculoskeletal pain with radicular symptoms.  Pain in the buttocks is almost certainly secondary to lower back pathology.  I reviewed her x-rays myself, and there is no osteoarthritis of the hip joint.  She does have some decreased anterior thigh sensation, secondary to lumbar pathology versus piriformis is possible.  Going to give her a home program on piriformis stretching and rehab.  Hold physical therapy for 2 weeks.  14 days of steroids.  Flexeril at night.  Her foot numbness is baseline.  Follow-up: Return in about 1 month (around 10/11/2019).  Meds ordered this encounter  Medications  . predniSONE (DELTASONE) 20 MG tablet    Sig: 2 tabs po for 7 days, then 1 tab po for 7 days    Dispense:  21 tablet    Refill:  0  . cyclobenzaprine (FLEXERIL) 10 MG tablet    Sig: Take 0.5-1 tablets (5-10 mg total) by mouth at bedtime.    Dispense:  30 tablet    Refill:  1   There are no discontinued medications. No orders of the defined types were placed in this encounter.   Signed,  Maud Deed. , MD   Outpatient Encounter Medications as of 09/10/2019  Medication Sig  . acetaminophen (TYLENOL) 325 MG tablet Take 650 mg by mouth every 6 (six) hours as needed.  [name] ibuprofen (ADVIL) 200 MG tablet Take 200 mg by mouth every 6 (six) hours as needed.  [name] OVER THE COUNTER MEDICATION VITAMINS FOR IMMUNE SYSTEM  . cyclobenzaprine (FLEXERIL) 10 MG tablet Take 0.5-1 tablets (5-10 mg total) by mouth at bedtime.  . predniSONE (DELTASONE) 20 MG tablet 2 tabs po for 7 days, then 1 tab po for 7 days   No facility-administered encounter medications on file as of 09/10/2019.

## 2019-09-11 ENCOUNTER — Other Ambulatory Visit: Payer: Self-pay | Admitting: Otolaryngology

## 2019-09-11 DIAGNOSIS — K118 Other diseases of salivary glands: Secondary | ICD-10-CM

## 2019-09-12 ENCOUNTER — Other Ambulatory Visit: Payer: Self-pay | Admitting: Physician Assistant

## 2019-09-15 ENCOUNTER — Other Ambulatory Visit: Payer: Self-pay

## 2019-09-15 ENCOUNTER — Other Ambulatory Visit: Payer: Self-pay | Admitting: Otolaryngology

## 2019-09-15 ENCOUNTER — Ambulatory Visit
Admission: RE | Admit: 2019-09-15 | Discharge: 2019-09-15 | Disposition: A | Discharge status: Home / Self Care | Payer: BC Managed Care – PPO | Source: Ambulatory Visit | Attending: Otolaryngology | Admitting: Otolaryngology

## 2019-09-15 DIAGNOSIS — K118 Other diseases of salivary glands: Secondary | ICD-10-CM

## 2019-09-15 DIAGNOSIS — D37030 Neoplasm of uncertain behavior of the parotid salivary glands: Secondary | ICD-10-CM | POA: Diagnosis not present

## 2019-09-15 DIAGNOSIS — F17210 Nicotine dependence, cigarettes, uncomplicated: Secondary | ICD-10-CM | POA: Diagnosis not present

## 2019-09-15 DIAGNOSIS — Z841 Family history of disorders of kidney and ureter: Secondary | ICD-10-CM | POA: Insufficient documentation

## 2019-09-15 DIAGNOSIS — Z8349 Family history of other endocrine, nutritional and metabolic diseases: Secondary | ICD-10-CM | POA: Insufficient documentation

## 2019-09-15 DIAGNOSIS — Z808 Family history of malignant neoplasm of other organs or systems: Secondary | ICD-10-CM | POA: Insufficient documentation

## 2019-09-15 DIAGNOSIS — Z880 Allergy status to penicillin: Secondary | ICD-10-CM | POA: Insufficient documentation

## 2019-09-15 DIAGNOSIS — Z8541 Personal history of malignant neoplasm of cervix uteri: Secondary | ICD-10-CM | POA: Insufficient documentation

## 2019-09-15 DIAGNOSIS — R221 Localized swelling, mass and lump, neck: Secondary | ICD-10-CM | POA: Diagnosis not present

## 2019-09-15 DIAGNOSIS — Z8249 Family history of ischemic heart disease and other diseases of the circulatory system: Secondary | ICD-10-CM | POA: Insufficient documentation

## 2019-09-15 MED ORDER — FENTANYL CITRATE (PF) 100 MCG/2ML IJ SOLN
INTRAMUSCULAR | Status: AC
  Filled 2019-09-15: qty 2

## 2019-09-15 MED ORDER — MIDAZOLAM HCL 2 MG/2ML IJ SOLN
INTRAMUSCULAR | Status: AC
  Filled 2019-09-15: qty 2

## 2019-09-15 MED ORDER — FENTANYL CITRATE (PF) 100 MCG/2ML IJ SOLN
INTRAMUSCULAR | Status: AC | PRN
  Administered 2019-09-15 (×3): 50 ug via INTRAVENOUS

## 2019-09-15 MED ORDER — MIDAZOLAM HCL 2 MG/2ML IJ SOLN
INTRAMUSCULAR | Status: AC | PRN
  Administered 2019-09-15 (×3): 1 mg via INTRAVENOUS

## 2019-09-15 NOTE — H&P (Signed)
Chief Complaint: Patient was seen in consultation today for right parotid mass biopsy.  Referring Physician(s): Bennett,Paul  Supervising Physician: Markus Daft  Patient Status: [facility] - Out-pt  History of Present Illness: Erin Dorsey is a 49 y.o. female with a past medical history significant for chronic headaches, cervical cancer and recently noted right parotid swelling who presents today for a right parotid mass biopsy. Erin Dorsey noted a painful area of swelling under her right mandible for approximately 2 years and was referred to ENT by her PCP. She underwent a CT soft tissue neck with contrast on 09/09/19 which noted a 2 cm right parotid tail mass concerning for possible neoplasm. IR has been asked to perform a biopsy of this lesion for further evaluation.  Erin Dorsey denies any complaints today, she is nervous about the biopsy because it will be close to her face and requests that we use sedation for the procedure. She denies any pain or worsening swelling on the right side of her face. She states understanding of the requested procedure and is agreeable to proceed.  Past Medical History:  Diagnosis Date  . Allergy   . Cervical cancer ([place]) 2011   stage 3  . Chronic headaches     Past Surgical History:  Procedure Laterality Date  . COLPOSCOPY  2011 or 2012   stage 3-removed cancerous spot on the cervix  . FOOT SURGERY Right    x 2-took knots out from bottom of the foot-not sure of the technical name  . TUBAL LIGATION      Allergies: Penicillins  Medications: Prior to Admission medications   Medication Sig Start Date End Date Taking? Authorizing Provider  acetaminophen (TYLENOL) 325 MG tablet Take 650 mg by mouth every 6 (six) hours as needed.    [provider]  cyclobenzaprine (FLEXERIL) 10 MG tablet Take 0.5-1 tablets (5-10 mg total) by mouth at bedtime. 09/10/19 09/09/20  Copland, Frederico Hamman, MD  ibuprofen (ADVIL) 200 MG tablet Take 200 mg by mouth  every 6 (six) hours as needed.    [provider]  OVER THE COUNTER MEDICATION VITAMINS FOR IMMUNE SYSTEM    [provider]  predniSONE (DELTASONE) 20 MG tablet 2 tabs po for 7 days, then 1 tab po for 7 days 09/10/19   Owens Loffler, MD     Family History  Problem Relation Age of Onset  . Hypertension Mother   . Kidney disease Father        was on dialysis  . Uterine cancer Sister   . Cervical cancer Sister   . Deafness Daughter   . Cervical cancer Maternal Grandmother   . Lung cancer Maternal Grandfather   . Brain cancer Maternal Grandfather   . Thyroid disease Daughter   . Breast cancer Paternal Aunt     Social History   Socioeconomic History  . Marital status: Married    Spouse name: [name]  . Number of children: 3  . Years of education: 7th grade  . Highest education level: Not on file  Occupational History  . Not on file  Tobacco Use  . Smoking status: Current Some Day Smoker    Packs/day: 0.25    Years: 20.00    Pack years: 5.00    Types: Cigarettes  . Smokeless tobacco: Never Used  . Tobacco comment: not daily  Substance and Sexual Activity  . 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 Drug use: Yes    Frequency: 1.0  times per week    Types: Marijuana  . Sexual activity: Yes    Birth control/protection: Surgical  Other Topics Concern  . Not on file  Social History Narrative   08/06/19   From: [place], came here to be close to daughter   Living: with husband [name] together since 2011 and married since 2021   Work: not currently, use to work at [employer]: 3 daughter - in [place] - ok relationship - 1 grandchild / with [name] has 18 step grandchildren      Enjoys: walk, watch movies, painting, cross stitch      Exercise: walking   Diet: eats whatever      Safety   Seat belts: Yes    Guns: No   Safe in relationships: Yes    Social Determinants of Radio broadcast assistant Strain:   . Difficulty of Paying  Living Expenses:   Food Insecurity:   . Worried About Charity fundraiser in the Last Year:   . Arboriculturist in the Last Year:   Transportation Needs:   . Film/video editor (Medical):   [name] Lack of Transportation (Non-Medical):   Physical Activity:   . Days of Exercise per Week:   . Minutes of Exercise per Session:   Stress:   . Feeling of Stress :   Social Connections:   . Frequency of Communication with Friends and Family:   . Frequency of Social Gatherings with Friends and Family:   . Attends Religious Services:   . Active Member of Clubs or Organizations:   . Attends Archivist Meetings:   [name] Marital Status:      Review of Systems: A 12 point ROS discussed and pertinent positives are indicated in the HPI above.  All other systems are negative.  Review of Systems  Constitutional: Negative for chills and fever.  HENT: Negative for trouble swallowing.   Respiratory: Negative for cough and shortness of breath.   Cardiovascular: Negative for chest pain.  Gastrointestinal: Negative for abdominal pain, diarrhea, nausea and vomiting.  Musculoskeletal: Negative for back pain.  Skin: Negative for wound.  Neurological: Negative for dizziness and headaches.    Vital Signs: LMP 06/23/2015   Physical Exam Vitals reviewed.  Constitutional:      General: She is not in acute distress. HENT:     Head: Normocephalic.     Mouth/Throat:     Mouth: Mucous membranes are moist.     Pharynx: Oropharynx is clear. No oropharyngeal exudate or posterior oropharyngeal erythema.  Neck:     Comments: (+) palpable firm area over right mandible, non tender Cardiovascular:     Rate and Rhythm: Normal rate and regular rhythm.  Pulmonary:     Effort: Pulmonary effort is normal.     Breath sounds: Normal breath sounds.  Abdominal:     General: There is no distension.     Palpations: Abdomen is soft.     Tenderness: There is no abdominal tenderness.  Skin:    General: Skin is  warm and dry.  Neurological:     Mental Status: She is alert and oriented to person, place, and time.  Psychiatric:        Mood and Affect: Mood normal.        Behavior: Behavior normal.        Thought Content: Thought content normal.        Judgment: Judgment normal.  MD Evaluation Airway: WNL Heart: WNL Abdomen: WNL Chest/ Lungs: WNL ASA  Classification: 2 Mallampati/Airway Score: Two   Imaging: CT SOFT TISSUE NECK W CONTRAST  Result Date: 09/09/2019 CLINICAL DATA:  Painful knot under the right mandible for 2 years. EXAM: CT NECK WITH CONTRAST TECHNIQUE: Multidetector CT imaging of the neck was performed using the standard protocol following the bolus administration of intravenous contrast. CONTRAST:  63mL OMNIPAQUE IOHEXOL 300 MG/ML  SOLN COMPARISON:  None. FINDINGS: Pharynx and larynx: No evidence of mass or swelling. Salivary glands: Isodense lobulated mass in the tail of the right parotid measuring up to 2 cm on axial slices. There is unusual peripheral smooth rounded calcification at the level of the mass. No calcifications are seen of elsewhere and there is no ductal dilatation. Other small parotid nodules have the appearance normal lymph nodes. No evidence of active inflammation. Thyroid: Normal. Lymph nodes: None enlarged or abnormal density. Vascular: Unremarkable Limited intracranial: Negative Visualized orbits: Negative Mastoids and visualized paranasal sinuses: Clear Skeleton: Negative Upper chest: Negative IMPRESSION: 2 cm right parotid tail mass. The mass is outlined by multiple rounded calcifications which have the appearance of phleboliths, but no enhancement typical of a vascular malformation. Neoplasm is the leading consideration, consider biopsy. Electronically Signed   By: Monte Fantasia M.D.   On: 09/09/2019 04:21    Labs:  CBC: Recent Labs    08/06/19 1127  WBC 4.7  HGB 14.3  HCT 43.1  PLT 161.0    COAGS: No results for input(s): INR, APTT in the  last 8760 hours.  BMP: Recent Labs    08/06/19 1127  NA 143  K 3.5  CL 109  CO2 25  GLUCOSE 74  BUN 6  CALCIUM 9.1  CREATININE 0.70    LIVER FUNCTION TESTS: Recent Labs    08/06/19 1127  BILITOT 0.5  AST 18  ALT 16  ALKPHOS 32*  PROT 7.2  ALBUMIN 4.2    TUMOR MARKERS: No results for input(s): AFPTM, CEA, CA199, CHROMGRNA in the last 8760 hours.  Assessment and Plan:  49 y/o F with right mandibular swelling x 2 years who underwent CT soft tissue neck on 09/09/19 which showed a mass concerning for possible neoplasm - IR has been asked to perform a biopsy of this mass to further guide care.  Patient has been NPO since 9 pm yesterday aside from a few sips of water this morning with her medications, no anticoagulation/antiplatelet medications.  Risks and benefits of right parotid mass was discussed with the patient and/or patient's family including, but not limited to bleeding, infection, damage to adjacent structures or low yield requiring additional tests.  All of the questions were answered and there is agreement to proceed.  Consent signed and in chart.  Thank you for this interesting consult.  I greatly enjoyed meeting Erin Dorsey and look forward to participating in their care.  A copy of this report was sent to the requesting provider on this date.  Electronically Signed: Joaquim Nam, PA-C 09/15/2019, 9:28 AM   I spent a total of30 Minutes  in face to face in clinical consultation, greater than 50% of which was counseling/coordinating care for right parotid mass biopsy.

## 2019-09-15 NOTE — Progress Notes (Signed)
Patient clinically stable post parotid biopsy per Dr Anselm Pancoast, tolerated well. Denies complaints post procedure. Received versed 3mg  along with Fentanyl 181mcg for procedure, since [facility] required to do biopsy. Report given to Fransico Michael Rn post procedure with questions answered,.

## 2019-09-15 NOTE — Procedures (Signed)
Interventional Radiology Procedure:   Indications: Right parotid lesion  Procedure: US guided core and FNA biopsy  Findings: Multicystic right parotid lesion.  3 cores and 5 FNAs obtained.   Complications: None     EBL: less than 10 ml  Plan: Discharge to home in 1 hour.     R. Anselm Pancoast, MD  Pager: [phone]

## 2019-09-18 LAB — SURGICAL PATHOLOGY

## 2019-09-18 LAB — CYTOLOGY - NON PAP

## 2019-09-22 DIAGNOSIS — D3702 Neoplasm of uncertain behavior of tongue: Secondary | ICD-10-CM | POA: Diagnosis not present

## 2019-09-22 DIAGNOSIS — D37030 Neoplasm of uncertain behavior of the parotid salivary glands: Secondary | ICD-10-CM | POA: Diagnosis not present

## 2019-09-22 DIAGNOSIS — K1123 Chronic sialoadenitis: Secondary | ICD-10-CM | POA: Diagnosis not present

## 2019-10-06 ENCOUNTER — Encounter: Payer: Self-pay | Admitting: Family Medicine

## 2019-10-06 ENCOUNTER — Other Ambulatory Visit: Payer: Self-pay

## 2019-10-06 ENCOUNTER — Ambulatory Visit (INDEPENDENT_AMBULATORY_CARE_PROVIDER_SITE_OTHER): Payer: BC Managed Care – PPO | Admitting: Family Medicine

## 2019-10-06 VITALS — BP 122/88 | HR 79 | Temp 97.8°F | Ht 67.5 in | Wt 211.0 lb

## 2019-10-06 DIAGNOSIS — F331 Major depressive disorder, recurrent, moderate: Secondary | ICD-10-CM | POA: Diagnosis not present

## 2019-10-06 DIAGNOSIS — B372 Candidiasis of skin and nail: Secondary | ICD-10-CM | POA: Diagnosis not present

## 2019-10-06 MED ORDER — NYSTATIN 100000 UNIT/GM EX CREA: 1.0000 "application " | TOPICAL_CREAM | Freq: Two times a day (BID) | CUTANEOUS | 0 refills | Status: DC

## 2019-10-06 MED ORDER — FLUOXETINE HCL 20 MG PO CAPS: 20.0000 mg | ORAL_CAPSULE | Freq: Every day | ORAL | 3 refills | Status: DC

## 2019-10-06 NOTE — Progress Notes (Signed)
Subjective:     Erin Dorsey is a 49 y.o. female presenting for Rash and Depression     HPI  #Rash - under her breasts - using vagisel - gets this annually  - itchy and using calamine lotion  #Depression/anxiety - sad and crying all the time - her daughter just arrived last month - husband is in radiation treatment - daughter is hearing impaired and her grandson (11 year old) is here as well and has mild autism  - hx of depression in the past - has noticed worsening symptoms over the last few weeks - prior treatment: prozac   Review of Systems   Social History   Tobacco Use  Smoking Status Former Smoker  . Packs/day: 0.25  . Years: 20.00  . Pack years: 5.00  . Types: Cigarettes  . Quit date: 09/01/2019  . Years since quitting: 0.0  Smokeless Tobacco Never Used  Tobacco Comment   not daily        Objective:    BP Readings from Last 3 Encounters:  10/06/19 122/88  09/15/19 114/71  09/10/19 (!) 130/98   Wt Readings from Last 3 Encounters:  10/06/19 211 lb (95.7 kg)  09/15/19 212 lb 8.4 oz (96.4 kg)  09/10/19 212 lb 8 oz (96.4 kg)    BP 122/88   Pulse 79   Temp 97.8 F (36.6 C) (Temporal)   Ht 5' 7.5" (1.715 m)   Wt 211 lb (95.7 kg)   LMP 06/23/2015   SpO2 97%   BMI 32.56 kg/m    Physical Exam Constitutional:      General: She is not in acute distress.    Appearance: She is well-developed. She is not diaphoretic.  HENT:     Right Ear: External ear normal.     Left Ear: External ear normal.  Eyes:     Conjunctiva/sclera: Conjunctivae normal.  Cardiovascular:     Rate and Rhythm: Normal rate.  Pulmonary:     Effort: Pulmonary effort is normal.  Musculoskeletal:     Cervical back: Neck supple.  Skin:    General: Skin is warm and dry.     Capillary Refill: Capillary refill takes less than 2 seconds.     Comments: Erythematous rash with satellite lesions under bilateral breasts.   Neurological:     Mental Status: She is alert.  Mental status is at baseline.  Psychiatric:        Mood and Affect: Mood normal.        Behavior: Behavior normal.      Office Visit from 10/06/2019 in [place] at [facility]  PHQ-9 Total Score  21     GAD 7 : Generalized Anxiety Score 10/06/2019  Nervous, Anxious, on Edge 3  Control/stop worrying 3  Worry too much - different things 3  Trouble relaxing 3  Restless 3  Easily annoyed or irritable 3  Afraid - awful might happen 0  Total GAD 7 Score 18  Anxiety Difficulty Very difficult           Assessment & Plan:   Problem List Items Addressed This Visit      Musculoskeletal and Integument   Candidiasis of skin    Most consistent with nystatin, though if no improvement may trial steroids.       Relevant Medications   nystatin cream (MYCOSTATIN)     Other   Moderate episode of recurrent major depressive disorder ([place]) - Primary    Given hx of  depression discussed starting medication now, even though symptoms only present for 1 month. Pt prefers to initiate treatment now. Will do prozac. Return in 6 weeks and mychart in 4 if desiring dose increase. Risks discussed.       Relevant Medications   FLUoxetine (PROZAC) 20 MG capsule       Return in about 6 weeks (around 11/17/2019).  [name], MD

## 2019-10-06 NOTE — Patient Instructions (Addendum)
#  Rash - candida (yeast)  - Nystatin Cream - 2 times a day - call if not improving in 2-3 days    Return in around 6 weeks  At 4 weeks, if you are interested in increasing the dose of your medication you can send me a MyChart message and we can increase the dose.    You are going to start a new antidepressant medication.   One of the risks of this medication is increase in suicidal thoughts.   Your suicide Action plan is as follows:  1) Call husband 2) Call the Suicide Hotline [phone] [phone] which is available 24 hours 3) Call the Clinic   The most common side effect is stomach upset. If this happens it means the medication is working. It should get better in 1-3 weeks.   Medication for depression and anxiety often takes 6-8 weeks to have a noticeable difference so stick with it. Also the best way for recovery is taking medication and seeing a therapist -- this is so important.    How to help anxiety and depression  1) Regular Exercise - walking, jogging, cycling, dancing, strength training - aiming for 150 minutes of exercise a week --> Yoga has been shown in research to reduce depression and anxiety -- with even just one hour long session per week  2)  Begin a Mindfulness/Meditation practice -- this can take a little as 3 minutes and is helpful for all kinds of mood issues -- You can find resources in books -- Or you can download apps like  ---- Headspace App  ---- Calm  ---- Insignt Timer ---- Stop, Breathe & Think  # With each of these Apps - you should decline the "start free trial" offer and as you search through the App should be able to access some of their free content. You can also chose to pay for the content if you find one that works well for you.   # Many of them also offer sleep specific content which may help with insomnia  3) Healthy Diet -- Avoid or decrease Caffeine -- Avoid or decrease Alcohol -- Drink plenty of water, have a balanced diet -- Avoid  cigarettes and marijuana (as well as other recreational drugs)  4) Find a therapist  -- [facility] is one option. Call [phone] -- Or you can check out www.psychologytoday.com -- you can read bios of therapists and see if they accept insurance -- Check with your insurance to see if you have coverage and who may take your insurance

## 2019-10-06 NOTE — Assessment & Plan Note (Signed)
Most consistent with nystatin, though if no improvement may trial steroids.

## 2019-10-06 NOTE — Assessment & Plan Note (Signed)
Given hx of depression discussed starting medication now, even though symptoms only present for 1 month. Pt prefers to initiate treatment now. Will do prozac. Return in 6 weeks and mychart in 4 if desiring dose increase. Risks discussed.

## 2019-10-09 ENCOUNTER — Other Ambulatory Visit: Payer: Self-pay

## 2019-10-09 ENCOUNTER — Encounter
Admission: RE | Admit: 2019-10-09 | Discharge: 2019-10-09 | Disposition: A | Discharge status: Home / Self Care | Payer: BC Managed Care – PPO | Source: Ambulatory Visit | Attending: Otolaryngology | Admitting: Otolaryngology

## 2019-10-09 HISTORY — DX: Anxiety disorder, unspecified: F41.9

## 2019-10-09 HISTORY — DX: Repeated falls: R29.6

## 2019-10-09 HISTORY — DX: Gastro-esophageal reflux disease without esophagitis: K21.9

## 2019-10-09 HISTORY — DX: Depression, unspecified: F32.A

## 2019-10-09 NOTE — Patient Instructions (Signed)
Your procedure is scheduled on: Wed. 6/16 Report to Day Surgery. To find out your arrival time please call [phone] between 1PM - 3PM on .tues 6/15  Remember: Instructions that are not followed completely may result in serious medical risk,  up to and including death, or upon the discretion of your surgeon and anesthesiologist your  surgery may need to be rescheduled.     _X__ 1. Do not eat food after midnight the night before your procedure.                 No gum chewing or hard candies. You may drink clear liquids up to 2 hours                 before you are scheduled to arrive for your surgery- DO not drink clear                 liquids within 2 hours of the start of your surgery.                 Clear Liquids include:  water, apple juice without pulp, clear Gatorade, G2 or                  Gatorade Zero (avoid Red/Purple/Blue), Black Coffee or Tea (Do not add                 anything to coffee or tea). _____2.   Complete the carbohydrate drink provided to you, 2 hours before arrival.  __X__2.  On the morning of surgery brush your teeth with toothpaste and water, you                may rinse your mouth with mouthwash if you wish.  Do not swallow any toothpaste of mouthwash.     ___ 3.  No Alcohol for 24 hours before or after surgery.   ___ 4.  Do Not Smoke or use e-cigarettes For 24 Hours Prior to Your Surgery.                 Do not use any chewable tobacco products for at least 6 hours prior to                 Surgery.  _X__  5.  Do not use any recreational drugs (marijuana, cocaine, heroin, ecstacy, MDMA or other)                For at least one week prior to your surgery.  Combination of these drugs with anesthesia                May have life threatening results.  ____  6.  Bring all medications with you on the day of surgery if instructed.   __x__  7.  Notify your doctor if there is any change in your medical condition      (cold, fever,  infections).     Do not wear jewelry, make-up, hairpins, clips or nail polish. Do not wear lotions, powders, or perfumes. You may wear deodorant. Do not shave 48 hours prior to surgery.  Do not bring valuables to the hospital.    [facility] is not responsible for any belongings or valuables.  Contacts, dentures or bridgework may not be worn into surgery. Leave your suitcase in the car. After surgery it may be brought to your room. For patients admitted to the hospital, discharge time is determined by your treatment team.   Patients discharged  the day of surgery will not be allowed to drive home.   Make arrangements for someone to be with you for the first 24 hours of your Same Day Discharge.    Please read over the following fact sheets that you were given:    _x___ Take these medicines the morning of surgery with A SIP OF WATER:    1. FLUoxetine (PROZAC) 20 MG capsule  2. cyclobenzaprine (FLEXERIL) 10 MG tablet if needed  3. acetaminophen (TYLENOL) 325 MG tablet if needed  4.  5.  6.  ____ Fleet Enema (as directed)   _x___ Use CHG Soap (or wipes) as directed  ____ Use Benzoyl Peroxide Gel as instructed  ____ Use inhalers on the day of surgery  ____ Stop metformin 2 days prior to surgery    ____ Take 1/2 of usual insulin dose the night before surgery. No insulin the morning          of surgery.   ____ Stop Coumadin/Plavix/aspirin on   __x__ Stop Anti-inflammatories ibuprofen (ADVIL) 200 MG tablet, aleve or aspirin today  May continue tylenol    ____ Stop supplements until after surgery.    ____ Bring C-Pap to the hospital.

## 2019-10-13 ENCOUNTER — Other Ambulatory Visit: Payer: Self-pay

## 2019-10-13 ENCOUNTER — Other Ambulatory Visit
Admission: RE | Admit: 2019-10-13 | Discharge: 2019-10-13 | Disposition: A | Discharge status: Home / Self Care | Payer: BC Managed Care – PPO | Source: Ambulatory Visit | Attending: Otolaryngology | Admitting: Otolaryngology

## 2019-10-13 DIAGNOSIS — Z20822 Contact with and (suspected) exposure to covid-19: Secondary | ICD-10-CM | POA: Diagnosis not present

## 2019-10-13 DIAGNOSIS — Z01812 Encounter for preprocedural laboratory examination: Secondary | ICD-10-CM | POA: Insufficient documentation

## 2019-10-13 LAB — SARS CORONAVIRUS 2 (TAT 6-24 HRS): SARS Coronavirus 2: NEGATIVE

## 2019-10-15 ENCOUNTER — Ambulatory Visit: Payer: BC Managed Care – PPO

## 2019-10-15 ENCOUNTER — Ambulatory Visit
Admission: RE | Admit: 2019-10-15 | Discharge: 2019-10-15 | Disposition: A | Discharge status: Home / Self Care | Payer: BC Managed Care – PPO | Attending: Otolaryngology | Admitting: Otolaryngology

## 2019-10-15 ENCOUNTER — Other Ambulatory Visit: Payer: Self-pay

## 2019-10-15 ENCOUNTER — Encounter: Payer: Self-pay | Admitting: Otolaryngology

## 2019-10-15 ENCOUNTER — Encounter
Admission: RE | Disposition: A | Discharge status: Home / Self Care | Payer: Self-pay | Source: Home / Self Care | Attending: Otolaryngology

## 2019-10-15 DIAGNOSIS — D1809 Hemangioma of other sites: Secondary | ICD-10-CM | POA: Diagnosis not present

## 2019-10-15 DIAGNOSIS — I10 Essential (primary) hypertension: Secondary | ICD-10-CM | POA: Diagnosis not present

## 2019-10-15 DIAGNOSIS — D101 Benign neoplasm of tongue: Secondary | ICD-10-CM | POA: Diagnosis not present

## 2019-10-15 DIAGNOSIS — K118 Other diseases of salivary glands: Secondary | ICD-10-CM | POA: Diagnosis not present

## 2019-10-15 DIAGNOSIS — K135 Oral submucous fibrosis: Secondary | ICD-10-CM | POA: Insufficient documentation

## 2019-10-15 DIAGNOSIS — K148 Other diseases of tongue: Secondary | ICD-10-CM | POA: Diagnosis not present

## 2019-10-15 DIAGNOSIS — Z87891 Personal history of nicotine dependence: Secondary | ICD-10-CM | POA: Diagnosis not present

## 2019-10-15 DIAGNOSIS — D110 Benign neoplasm of parotid gland: Secondary | ICD-10-CM | POA: Insufficient documentation

## 2019-10-15 DIAGNOSIS — K1370 Unspecified lesions of oral mucosa: Secondary | ICD-10-CM | POA: Diagnosis not present

## 2019-10-15 HISTORY — PX: EXCISION OF TONGUE LESION: SHX6434

## 2019-10-15 HISTORY — PX: PAROTIDECTOMY: SHX2163

## 2019-10-15 LAB — URINE DRUG SCREEN, QUALITATIVE (ARMC ONLY)
Amphetamines, Ur Screen: NOT DETECTED
Barbiturates, Ur Screen: NOT DETECTED
Benzodiazepine, Ur Scrn: NOT DETECTED
Cannabinoid 50 Ng, Ur ~~LOC~~: POSITIVE — AB
Cocaine Metabolite,Ur ~~LOC~~: NOT DETECTED
MDMA (Ecstasy)Ur Screen: NOT DETECTED
Methadone Scn, Ur: NOT DETECTED
Opiate, Ur Screen: NOT DETECTED
Phencyclidine (PCP) Ur S: NOT DETECTED
Tricyclic, Ur Screen: POSITIVE — AB

## 2019-10-15 SURGERY — EXCISION, PAROTID GLAND
Anesthesia: General | Laterality: Right

## 2019-10-15 MED ORDER — REMIFENTANIL HCL 1 MG IV SOLR
INTRAVENOUS | Status: DC | PRN
  Administered 2019-10-15: .15 ug/kg/min via INTRAVENOUS

## 2019-10-15 MED ORDER — HYDROCODONE-ACETAMINOPHEN 5-325 MG PO TABS: 1.0000 | ORAL_TABLET | Freq: Four times a day (QID) | ORAL | 0 refills | Status: DC | PRN

## 2019-10-15 MED ORDER — PROPOFOL 10 MG/ML IV BOLUS
INTRAVENOUS | Status: DC | PRN
  Administered 2019-10-15: 30 mg via INTRAVENOUS
  Administered 2019-10-15: 50 mg via INTRAVENOUS
  Administered 2019-10-15: 30 mg via INTRAVENOUS
  Administered 2019-10-15 (×2): 20 mg via INTRAVENOUS
  Administered 2019-10-15: 120 mg via INTRAVENOUS

## 2019-10-15 MED ORDER — FAMOTIDINE 20 MG PO TABS
ORAL_TABLET | ORAL | Status: AC
  Administered 2019-10-15: 20 mg via ORAL
  Filled 2019-10-15: qty 1

## 2019-10-15 MED ORDER — DEXMEDETOMIDINE HCL 200 MCG/2ML IV SOLN
INTRAVENOUS | Status: DC | PRN
  Administered 2019-10-15 (×2): 8 ug via INTRAVENOUS
  Administered 2019-10-15: 4 ug via INTRAVENOUS

## 2019-10-15 MED ORDER — MIDAZOLAM HCL 2 MG/2ML IJ SOLN
INTRAMUSCULAR | Status: AC
  Filled 2019-10-15: qty 2

## 2019-10-15 MED ORDER — PHENYLEPHRINE HCL (PRESSORS) 10 MG/ML IV SOLN
INTRAVENOUS | Status: AC
  Filled 2019-10-15: qty 1

## 2019-10-15 MED ORDER — SUCCINYLCHOLINE CHLORIDE 20 MG/ML IJ SOLN
INTRAMUSCULAR | Status: DC | PRN
Start: 2019-10-15 — End: 2019-10-15
  Administered 2019-10-15: 120 mg via INTRAVENOUS

## 2019-10-15 MED ORDER — FENTANYL CITRATE (PF) 100 MCG/2ML IJ SOLN
INTRAMUSCULAR | Status: DC | PRN
  Administered 2019-10-15 (×4): 50 ug via INTRAVENOUS

## 2019-10-15 MED ORDER — ONDANSETRON HCL 4 MG/2ML IJ SOLN
INTRAMUSCULAR | Status: DC | PRN
  Administered 2019-10-15: 4 mg via INTRAVENOUS

## 2019-10-15 MED ORDER — LIDOCAINE-EPINEPHRINE (PF) 1 %-1:200000 IJ SOLN
INTRAMUSCULAR | Status: AC
  Filled 2019-10-15: qty 30

## 2019-10-15 MED ORDER — ORAL CARE MOUTH RINSE: 15.0000 mL | Freq: Once | OROMUCOSAL | Status: AC

## 2019-10-15 MED ORDER — PHENYLEPHRINE HCL (PRESSORS) 10 MG/ML IV SOLN
INTRAVENOUS | Status: DC | PRN
  Administered 2019-10-15 (×2): 100 ug via INTRAVENOUS

## 2019-10-15 MED ORDER — PROPOFOL 500 MG/50ML IV EMUL
INTRAVENOUS | Status: AC
  Filled 2019-10-15: qty 50

## 2019-10-15 MED ORDER — GLYCOPYRROLATE 0.2 MG/ML IJ SOLN
INTRAMUSCULAR | Status: AC
  Filled 2019-10-15: qty 1

## 2019-10-15 MED ORDER — FENTANYL CITRATE (PF) 100 MCG/2ML IJ SOLN
INTRAMUSCULAR | Status: AC
  Filled 2019-10-15: qty 2

## 2019-10-15 MED ORDER — PROPOFOL 500 MG/50ML IV EMUL
INTRAVENOUS | Status: DC | PRN
  Administered 2019-10-15: 125 ug/kg/min via INTRAVENOUS

## 2019-10-15 MED ORDER — LACTATED RINGERS IV SOLN: INTRAVENOUS | Status: DC

## 2019-10-15 MED ORDER — DEXAMETHASONE SODIUM PHOSPHATE 10 MG/ML IJ SOLN
INTRAMUSCULAR | Status: DC | PRN
  Administered 2019-10-15: 5 mg via INTRAVENOUS

## 2019-10-15 MED ORDER — CHLORHEXIDINE GLUCONATE 0.12 % MT SOLN
OROMUCOSAL | Status: AC
  Administered 2019-10-15: 15 mL via OROMUCOSAL
  Filled 2019-10-15: qty 15

## 2019-10-15 MED ORDER — HYDROCODONE-ACETAMINOPHEN 5-325 MG PO TABS
1.0000 | ORAL_TABLET | ORAL | Status: DC | PRN
  Administered 2019-10-15 (×2): 1 via ORAL

## 2019-10-15 MED ORDER — FENTANYL CITRATE (PF) 100 MCG/2ML IJ SOLN
25.0000 ug | INTRAMUSCULAR | Status: DC | PRN
  Administered 2019-10-15 (×4): 25 ug via INTRAVENOUS

## 2019-10-15 MED ORDER — PROPOFOL 10 MG/ML IV BOLUS
INTRAVENOUS | Status: AC
  Filled 2019-10-15: qty 20

## 2019-10-15 MED ORDER — HYDROCODONE-ACETAMINOPHEN 5-325 MG PO TABS
ORAL_TABLET | ORAL | Status: AC
  Filled 2019-10-15: qty 1

## 2019-10-15 MED ORDER — ONDANSETRON HCL 4 MG/2ML IJ SOLN: 4.0000 mg | Freq: Once | INTRAMUSCULAR | Status: DC | PRN

## 2019-10-15 MED ORDER — LIDOCAINE-EPINEPHRINE 1 %-1:100000 IJ SOLN
INTRAMUSCULAR | Status: AC
  Filled 2019-10-15: qty 1

## 2019-10-15 MED ORDER — REMIFENTANIL HCL 1 MG IV SOLR
INTRAVENOUS | Status: AC
  Filled 2019-10-15: qty 2000

## 2019-10-15 MED ORDER — SODIUM CHLORIDE 0.9 % IV SOLN
INTRAVENOUS | Status: DC | PRN
  Administered 2019-10-15: 40 ug/min via INTRAVENOUS

## 2019-10-15 MED ORDER — GLYCOPYRROLATE 0.2 MG/ML IJ SOLN
INTRAMUSCULAR | Status: DC | PRN
  Administered 2019-10-15: .2 mg via INTRAVENOUS

## 2019-10-15 MED ORDER — ONDANSETRON HCL 4 MG/2ML IJ SOLN
INTRAMUSCULAR | Status: AC
  Filled 2019-10-15: qty 2

## 2019-10-15 MED ORDER — MIDAZOLAM HCL 2 MG/2ML IJ SOLN
INTRAMUSCULAR | Status: DC | PRN
  Administered 2019-10-15: 2 mg via INTRAVENOUS

## 2019-10-15 MED ORDER — LACTATED RINGERS IV SOLN: INTRAVENOUS | Status: DC | PRN

## 2019-10-15 MED ORDER — CHLORHEXIDINE GLUCONATE 0.12 % MT SOLN: 15.0000 mL | Freq: Once | OROMUCOSAL | Status: AC

## 2019-10-15 MED ORDER — BACITRACIN ZINC 500 UNIT/GM EX OINT
TOPICAL_OINTMENT | CUTANEOUS | Status: AC
  Filled 2019-10-15: qty 28.35

## 2019-10-15 MED ORDER — LIDOCAINE-EPINEPHRINE (PF) 1 %-1:200000 IJ SOLN
INTRAMUSCULAR | Status: DC | PRN
  Administered 2019-10-15: 7 mL
  Administered 2019-10-15: 1 mL

## 2019-10-15 MED ORDER — DEXAMETHASONE SODIUM PHOSPHATE 10 MG/ML IJ SOLN
INTRAMUSCULAR | Status: AC
  Filled 2019-10-15: qty 1

## 2019-10-15 MED ORDER — FAMOTIDINE 20 MG PO TABS: 20.0000 mg | ORAL_TABLET | Freq: Once | ORAL | Status: AC

## 2019-10-15 MED ORDER — SUCCINYLCHOLINE CHLORIDE 200 MG/10ML IV SOSY
PREFILLED_SYRINGE | INTRAVENOUS | Status: AC
  Filled 2019-10-15: qty 10

## 2019-10-15 SURGICAL SUPPLY — 44 items
ADHESIVE MASTISOL STRL (MISCELLANEOUS) ×4 IMPLANT
BLADE SURG 15 STRL LF DISP TIS (BLADE) ×2 IMPLANT
BLADE SURG 15 STRL SS (BLADE) ×2
BULB RESERV EVAC DRAIN JP 100C (MISCELLANEOUS) ×4 IMPLANT
CANISTER SUCT 1200ML W/VALVE (MISCELLANEOUS) ×4 IMPLANT
CORD BIP STRL DISP 12FT (MISCELLANEOUS) ×4 IMPLANT
COVER WAND RF STERILE (DRAPES) ×4 IMPLANT
CUP MEDICINE 2OZ PLAST GRAD ST (MISCELLANEOUS) ×4 IMPLANT
DRAIN JP 10F RND SILICONE (MISCELLANEOUS) ×4 IMPLANT
DRAPE MAG INST 16X20 L/F (DRAPES) ×4 IMPLANT
DRAPE SHEET LG 3/4 BI-LAMINATE (DRAPES) ×4 IMPLANT
DRAPE SURG 17X11 SM STRL (DRAPES) ×8 IMPLANT
DRSG TEGADERM 2-3/8X2-3/4 SM (GAUZE/BANDAGES/DRESSINGS) ×12 IMPLANT
DRSG TEGADERM 4X4.75 (GAUZE/BANDAGES/DRESSINGS) ×4 IMPLANT
DRSG TELFA 4X3 1S NADH ST (GAUZE/BANDAGES/DRESSINGS) ×8 IMPLANT
ELECT EMG 20MM DUAL (MISCELLANEOUS) ×4
ELECT NEEDLE 20X.3 GREEN (MISCELLANEOUS) ×4
ELECT REM PT RETURN 9FT ADLT (ELECTROSURGICAL) ×4
ELECTRODE EMG 20MM DUAL (MISCELLANEOUS) ×2 IMPLANT
ELECTRODE NEEDLE 20X.3 GREEN (MISCELLANEOUS) ×2 IMPLANT
ELECTRODE REM PT RTRN 9FT ADLT (ELECTROSURGICAL) ×2 IMPLANT
FORCEPS JEWEL BIP 4-3/4 STR (INSTRUMENTS) ×4 IMPLANT
GAUZE 4X4 16PLY RFD (DISPOSABLE) ×8 IMPLANT
GAUZE SPONGE 4X4 12PLY STRL (GAUZE/BANDAGES/DRESSINGS) ×4 IMPLANT
GLOVE BIO SURGEON STRL SZ7.5 (GLOVE) ×16 IMPLANT
GOWN STRL REUS W/ TWL LRG LVL3 (GOWN DISPOSABLE) ×6 IMPLANT
GOWN STRL REUS W/TWL LRG LVL3 (GOWN DISPOSABLE) ×6
HOOK STAY BLUNT/RETRACTOR 5M (MISCELLANEOUS) ×4 IMPLANT
KIT TURNOVER KIT A (KITS) ×4 IMPLANT
LABEL OR SOLS (LABEL) IMPLANT
NS IRRIG 500ML POUR BTL (IV SOLUTION) ×4 IMPLANT
PACK HEAD/NECK (MISCELLANEOUS) ×4 IMPLANT
PROBE MONO 100X0.75 ELECT 1.9M (MISCELLANEOUS) ×4 IMPLANT
SHEARS HARMONIC 9CM CVD (BLADE) ×4 IMPLANT
SPONGE KITTNER 5P (MISCELLANEOUS) ×8 IMPLANT
SUT CHROMIC 4 0 RB 1X27 (SUTURE) ×4 IMPLANT
SUT PROLENE 5 0 PS 3 (SUTURE) ×4 IMPLANT
SUT SILK 2 0 (SUTURE) ×4
SUT SILK 2 0 SH (SUTURE) ×4 IMPLANT
SUT SILK 2-0 18XBRD TIE 12 (SUTURE) ×4 IMPLANT
SUT SILK 4 0 (SUTURE) ×4
SUT SILK 4-0 18XBRD TIE 12 (SUTURE) ×4 IMPLANT
SUT VIC AB 4-0 RB1 18 (SUTURE) ×4 IMPLANT
SUT VICRYL+ 3-0 27IN RB-1 (SUTURE) ×8 IMPLANT

## 2019-10-15 NOTE — Discharge Instructions (Addendum)
PAIN MED (HYDROCODONE/ACETAMIN.) SCRIPT SENT TO YOUR PHARMACY [facility] [address] FROM MD OFFICE VIA COMPUTER PER DR. Richardson Landry.  YOU HAD ONE PAIN PILL AT 1:25 PM AND 1 AT 4:00 PM.   MAY HAVE AGAIN AT 10:00 PM IF NEEDED.      Surgical [facility] Care Surgical drains are used to remove extra fluid that normally builds up in a surgical wound after surgery. A surgical drain helps to heal a surgical wound. Different kinds of surgical drains include:  Active drains. These drains use suction to pull drainage away from the surgical wound. Drainage flows through a tube to a container outside of the body. With these drains, you need to keep the bulb or the drainage container flat (compressed) at all times, except while you empty it. Flattening the bulb or container creates suction.  Passive drains. These drains allow fluid to drain naturally, by gravity. Drainage flows through a tube to a bandage (dressing) or a container outside of the body. Passive drains do not need to be emptied. A drain is placed during surgery. Right after surgery, drainage is usually bright red and a little thicker than water. The drainage may gradually turn yellow or pink and become thinner. It is likely that your health care provider will remove the drain when the drainage stops or when the amount decreases to 1-2 Tbsp (15-30 mL) during a 24-hour period. Supplies needed:  Tape.  Germ-free cleaning solution (sterile saline).  Cotton swabs.  Split gauze drain sponge: 4 x 4 inches (10 x 10 cm).  Gauze square: 4 x 4 inches (10 x 10 cm). How to care for your surgical drain Care for your drain as told by your health care provider. This is important to help prevent infection. If your drain is placed at your back, or any other hard-to-reach area, ask another person to assist you in performing the following tasks: General care  Keep the skin around the drain dry and covered with a dressing at all times.  Check your drain area every  day for signs of infection. Check for: ? Redness, swelling, or pain. ? Pus or a bad smell. ? Cloudy drainage. ? Tenderness or pressure at the drain exit site. Changing the dressing Follow instructions from your health care provider about how to change your dressing. Change your dressing at least once a day. Change it more often if needed to keep the dressing dry. Make sure you: 1. Gather your supplies. 2. Wash your hands with soap and water before you change your dressing. If soap and water are not available, use hand sanitizer. 3. Remove the old dressing. Avoid using scissors to do that. 4. Wash your hands with soap and water again after removing the old dressing. 5. Use sterile saline to clean your skin around the drain. You may need to use a cotton swab to clean the skin. 6. Place the tube through the slit in a drain sponge. Place the drain sponge so that it covers your wound. 7. Place the gauze square or another drain sponge on top of the drain sponge that is on the wound. Make sure the tube is between those layers. 8. Tape the dressing to your skin. 9. Tape the drainage tube to your skin 1-2 inches (2.5-5 cm) below the place where the tube enters your body. Taping keeps the tube from pulling on any stitches (sutures) that you have. 10. Wash your hands with soap and water. 11. Write down the color of your drainage  and how often you change your dressing. How to empty your active drain  1. Make sure that you have a measuring cup that you can empty your drainage into. 2. Wash your hands with soap and water. If soap and water are not available, use hand sanitizer. 3. Loosen any pins or clips that hold the tube in place. 4. If your health care provider tells you to strip the tube to prevent clots and tube blockages: ? Hold the tube at the skin with one hand. Use your other hand to pinch the tubing with your thumb and first finger. ? Gently move your fingers down the tube while squeezing very  lightly. This clears any drainage, clots, or tissue from the tube. ? You may need to do this several times each day to keep the tube clear. Do not pull on the tube. 5. Open the bulb cap or the drain plug. Do not touch the inside of the cap or the bottom of the plug. 6. Turn the device upside down and gently squeeze. 7. Empty all of the drainage into the measuring cup. 8. Compress the bulb or the container and replace the cap or the plug. To compress the bulb or the container, squeeze it firmly in the middle while you close the cap or plug the container. 9. Write down the amount of drainage that you have in each 24-hour period. If you have less than 2 Tbsp (30 mL) of drainage during 24 hours, contact your health care provider. 10. Flush the drainage down the toilet. 11. Wash your hands with soap and water. Contact a health care provider if:  You have redness, swelling, or pain around your drain area.  You have pus or a bad smell coming from your drain area.  You have a fever or chills.  The skin around your drain is warm to the touch.  The amount of drainage that you have is increasing instead of decreasing.  You have drainage that is cloudy.  There is a sudden stop or a sudden decrease in the amount of drainage that you have.  Your drain tube falls out.  Your active drain does not stay compressed after you empty it. Summary  Surgical drains are used to remove extra fluid that normally builds up in a surgical wound after surgery.  Different kinds of surgical drains include active drains and passive drains. Active drains use suction to pull drainage away from the surgical wound, and passive drains allow fluid to drain naturally.  It is important to care for your drain to prevent infection. If your drain is placed at your back, or any other hard-to-reach area, ask another person to assist you.  Contact your health care provider if you have redness, swelling, or pain around your drain  area. This information is not intended to replace advice given to you by your health care provider. Make sure you discuss any questions you have with your health care provider. Document Revised: 05/22/2018 Document Reviewed: 05/22/2018 Elsevier Patient Education  2020 [place] [employer] Empty your surgical drain as told by your health care provider. Use this form to write down the amount of fluid that has collected in the drainage container. Bring this form with you to your follow-up visits. Surgical drain #1 location: ___________________  Date __________ Time __________ Amount __________ Date __________ Time __________ Amount __________ Date __________ Time __________ Amount __________ Date __________ Time __________ Amount __________ Date __________ Time __________ Amount __________ Date  __________ Time __________ Amount __________ Date __________ Time __________ Amount __________ Date __________ Time __________ Amount __________ Date __________ Time __________ Amount __________ Date __________ Time __________ Amount __________ Date __________ Time __________ Amount __________ Date __________ Time __________ Amount __________ Date __________ Time __________ Amount __________ Date __________ Time __________ Amount __________ Date __________ Time __________ Amount __________ Date __________ Time __________ Amount __________ Date __________ Time __________ Amount __________ Date __________ Time __________ Amount __________ Date __________ Time __________ Amount __________ Date __________ Time __________ Amount __________ Date __________ Time __________ Amount __________ Surgical drain #2 location: ___________________ Date __________ Time __________ Amount __________ Date __________ Time __________ Amount __________ Date __________ Time __________ Amount __________ Date __________ Time __________ Amount __________ Date __________ Time __________ Amount  __________ Date __________ Time __________ Amount __________ Date __________ Time __________ Amount __________ Date __________ Time __________ Amount __________ Date __________ Time __________ Amount __________ Date __________ Time __________ Amount __________ Date __________ Time __________ Amount __________ Date __________ Time __________ Amount __________ Date __________ Time __________ Amount __________ Date __________ Time __________ Amount __________ Date __________ Time __________ Amount __________ Date __________ Time __________ Amount __________ Date __________ Time __________ Amount __________ Date __________ Time __________ Amount __________ Date __________ Time __________ Amount __________ Date __________ Time __________ Amount __________ Date __________ Time __________ Amount __________ This information is not intended to replace advice given to you by your health care provider. Make sure you discuss any questions you have with your health care provider. Document Revised: 01/22/2017 Document Reviewed: 01/22/2017 Elsevier Patient Education  2020 [place]   1) The drugs that you were given will stay in your system until tomorrow so for the next 24 hours you should not:  A) Drive an automobile B) Make any legal decisions C) Drink any alcoholic beverage   2) You may resume regular meals tomorrow.  Today it is better to start with liquids and gradually work up to solid foods.  You may eat anything you prefer, but it is better to start with liquids, then soup and crackers, and gradually work up to solid foods.   3) Please notify your doctor immediately if you have any unusual bleeding, trouble breathing, redness and pain at the surgery site, drainage, fever, or pain not relieved by medication.    4) Additional Instructions:        Please contact your physician with any problems or Same Day Surgery at  [phone], Monday through Friday 6 am to 4 pm, or [place] [facility] number at [phone].

## 2019-10-15 NOTE — Anesthesia Preprocedure Evaluation (Signed)
Anesthesia Evaluation  Patient identified by MRN, date of birth, ID band Patient awake    Reviewed: Allergy & Precautions, H&P , NPO status , Patient's Chart, lab work & pertinent test results, reviewed documented beta blocker date and time   Airway Mallampati: II  TM Distance: >3 FB Neck ROM: full    Dental  (+) Teeth Intact   Pulmonary neg pulmonary ROS, former smoker,    Pulmonary exam normal        Cardiovascular Exercise Tolerance: Good hypertension, On Medications Normal cardiovascular exam Rhythm:regular Rate:Normal     Neuro/Psych  Headaches, PSYCHIATRIC DISORDERS Anxiety Depression    GI/Hepatic Neg liver ROS, GERD  Medicated,  Endo/Other  negative endocrine ROS  Renal/GU negative Renal ROS  negative genitourinary   Musculoskeletal   Abdominal   Peds  Hematology negative hematology ROS (+)   Anesthesia Other Findings Past Medical History: No date: Allergy No date: Anxiety 2011: Cervical cancer ([place])     Comment:  stage 3 No date: Chronic headaches No date: Depression No date: Frequent falls No date: GERD (gastroesophageal reflux disease) Past Surgical History: 2011 or 2012: COLPOSCOPY     Comment:  stage 3-removed cancerous spot on the cervix No date: FOOT SURGERY; Right     Comment:  x 2-took knots out from bottom of the foot-not sure of               the technical name No date: TUBAL LIGATION   Reproductive/Obstetrics negative OB ROS                             Anesthesia Physical Anesthesia Plan  ASA: II  Anesthesia Plan: General ETT   Post-op Pain Management:    Induction:   PONV Risk Score and Plan:   Airway Management Planned:   Additional Equipment:   Intra-op Plan:   Post-operative Plan:   Informed Consent: I have reviewed the patients History and Physical, chart, labs and discussed the procedure including the risks, benefits and alternatives for  the proposed anesthesia with the patient or authorized representative who has indicated his/her understanding and acceptance.     Dental Advisory Given  Plan Discussed with: CRNA  Anesthesia Plan Comments:         Anesthesia Quick Evaluation

## 2019-10-15 NOTE — Anesthesia Procedure Notes (Signed)
Procedure Name: Intubation Date/Time: 10/15/2019 8:47 AM Performed by: Nolon Lennert, RN Pre-anesthesia Checklist: Patient identified, Patient being monitored, Timeout performed, Emergency Drugs available and Suction available Patient Re-evaluated:Patient Re-evaluated prior to induction Oxygen Delivery Method: Circle system utilized Preoxygenation: Pre-oxygenation with 100% oxygen Induction Type: IV induction Ventilation: Mask ventilation without difficulty and Two handed mask ventilation required Laryngoscope Size: 3 and McGraph Grade View: Grade I Tube type: Oral Tube size: 7.0 mm Number of attempts: 1 Airway Equipment and Method: Stylet Placement Confirmation: ETT inserted through vocal cords under direct vision,  positive ETCO2 and breath sounds checked- equal and bilateral Secured at: 20 cm Tube secured with: Tape Dental Injury: Teeth and Oropharynx as per pre-operative assessment

## 2019-10-15 NOTE — OR Nursing (Signed)
Per Dr. Richardson Landry, tele, pain rx will be sent to [facility] [address] - pt aware of same/also on d/c instructions - told pt to disregard the statement to pick up pain med with rx from hospital.  Will shred script if found on printer here,

## 2019-10-15 NOTE — Op Note (Signed)
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 1cm midline dorsal tongue lesion, exophytic  Procedure: The patient was taken to the Operating Room and placed in the supine position.  After induction of general endotracheal anesthesia, the patient was turned 90 degrees and placed on a shoulder roll. The skin was injected along the proposed incision with 1% lidocaine with epinephrine, 1:100,000. The facial nerve monitor electrodes were placed in the usual fashion at the lower lip and brow on the same side of the procedure. Proper functioning of the nerve monitor was assessed. The area was then prepped and draped in the usual sterile fashion.   A 15 blade was then used to incise the skin from just in front of the tragus, curving below the right earlobe, and curving into the neck along an upper neck crease. . The dissection was carried down to the subcutaneous tissues with the harmonic scalpel and through the platysma muscle, exposing the anterior belly of the sternocleidomastoid muscle. More superiorly the dissection proceeded along the tragal cartilage and down towards the mastoid tip. The dissection proceeded inferiorly to expose the digastric muscle. Next a skin flap was elevated just superficial to the fascia over the parotid gland, widely exposing the gland. Dissection then proceeded deeper, dissecting down to the region of the stylomastoid notch, dividing soft tissues with the Harmonic scalpel. The facial  nerve was then identified at this point and confirmed with the nerve stimulator. Dissection then proceeded along the nerve anteriorly, identifying the pes and then dissecting along the superior and inferior branches of the nerve, dividing parotid tissue above the nerve with the Harmonic scalpel. The middle branch was noted to come off the inferior nerve branch and was carefully dissected out. The zygomatic and temporal branches were dissected out superiorly. Intervening parotid tissue between branches was divided with the Harmonic scalpel. Once the dissection had proceeded anterior to the mass of abnormal parotid, the gland was then divided anterior to the mass, avoiding injury to the dissected nerve branches,  The wound was irrigated with saline and hemostasis obtained. A #10 TLS drain was placed through a separate stab incision in the skin posterior and inferior to the incision in the neck, and secured with a 3-0 silk suture. The subcutaneous tissues were then closed with 4-0 Vicryl suture in an interrupted fashion. The skin was closed with 5-0 Prolene suture in a running locked stitch. Bacitracin ointment was applied to the wound.  Next with drapes were taken down and the mouth opened. A bite guard was used to hold the mouth open. The base of the tongue lesion was injected with 1% lidocaine with epinephrine, 1:100,000. A 2-0 Silk stitch was passed through the midline of the anterior tongue to help retract the tongue forward. The lesion was excised in an ellipse using the 15 blade for the initial incision, then using the bipolar cautery to excise the lesion to the level of the underlying muscle. Bleeding was controlled with the bipolar cautery. Deep tissues were closed with  4-0 Vicryl, and the mucosa closed with interrupted horizontal mattress stiches of 4-0 Chromic Gut.    The patient was then returned to the anesthesiologist for awakening, and was taken to the Recovery Room in stable condition.    Cultures:  None.   Disposition:   PACU then observation  Plan: To floor for observation with potential discharge home with drain in place.    Riley Nearing 10/15/2019 11:07 AM

## 2019-10-15 NOTE — H&P (Signed)
History and physical reviewed and will be scanned in later. No change in medical status reported by the patient or family, appears stable for surgery. All questions regarding the procedure answered, and patient (or family if a child) expressed understanding of the procedure. ? ? S  ?@TODAY@ ?

## 2019-10-15 NOTE — Transfer of Care (Addendum)
Immediate Anesthesia Transfer of Care Note  Patient: Erin Dorsey  Procedure(s) Performed: PAROTIDECTOMY (Right ) EXCISION OF TONGUE LESION (N/A )  Patient Location: PACU  Anesthesia Type:General  Level of Consciousness: awake, alert  and patient cooperative  Airway & Oxygen Therapy: Patient Spontanous Breathing and Patient connected to face mask oxygen  Post-op Assessment: Report given to RN and Post -op Vital signs reviewed and stable  Post vital signs: Reviewed and stable  Last Vitals:  Vitals Value Taken Time  BP 120/71 10/15/19 1118  Temp 36 C 10/15/19 1117  Pulse 81 10/15/19 1125  Resp 15 10/15/19 1125  SpO2 100 % 10/15/19 1125  Vitals shown include unvalidated device data.  Last Pain:  Vitals:   10/15/19 1117  TempSrc:   PainSc: 10-Worst pain ever      Patients Stated Pain Goal:  (meds per CRNA) (46/65/99 3570)  Complications: No complications documented.

## 2019-10-16 ENCOUNTER — Encounter: Payer: Self-pay | Admitting: Otolaryngology

## 2019-10-16 NOTE — Anesthesia Postprocedure Evaluation (Signed)
Anesthesia Post Note  Patient: Hermenia Bers  Procedure(s) Performed: PAROTIDECTOMY (Right ) EXCISION OF TONGUE LESION (N/A )  Patient location during evaluation: PACU Anesthesia Type: General Level of consciousness: awake and alert Pain management: pain level controlled Vital Signs Assessment: post-procedure vital signs reviewed and stable Respiratory status: spontaneous breathing, nonlabored ventilation, respiratory function stable and patient connected to nasal cannula oxygen Cardiovascular status: blood pressure returned to baseline and stable Postop Assessment: no apparent nausea or vomiting Anesthetic complications: no   No complications documented.   Last Vitals:  Vitals:   10/15/19 1548 10/15/19 1600  BP: 110/68 122/87  Pulse: 74 74  Resp: 18 18  Temp: 36.6 C   SpO2: 97% 99%    Last Pain:  Vitals:   10/16/19 0837  TempSrc:   PainSc: [place] 

## 2019-10-17 LAB — SURGICAL PATHOLOGY

## 2019-10-21 ENCOUNTER — Other Ambulatory Visit: Payer: Self-pay | Admitting: Family Medicine

## 2019-10-21 ENCOUNTER — Encounter: Payer: Self-pay | Admitting: Family Medicine

## 2019-10-21 DIAGNOSIS — B372 Candidiasis of skin and nail: Secondary | ICD-10-CM

## 2019-10-21 DIAGNOSIS — F331 Major depressive disorder, recurrent, moderate: Secondary | ICD-10-CM

## 2019-10-21 MED ORDER — FLUOXETINE HCL 40 MG PO CAPS: 40.0000 mg | ORAL_CAPSULE | Freq: Every day | ORAL | 0 refills | Status: DC

## 2019-10-21 MED ORDER — CYCLOBENZAPRINE HCL 10 MG PO TABS: 5.0000 mg | ORAL_TABLET | Freq: Every day | ORAL | 1 refills | Status: DC

## 2019-10-22 ENCOUNTER — Ambulatory Visit: Payer: BC Managed Care – PPO | Admitting: Family Medicine

## 2019-10-30 ENCOUNTER — Encounter: Payer: Self-pay | Admitting: Family Medicine

## 2019-10-30 ENCOUNTER — Other Ambulatory Visit: Payer: Self-pay

## 2019-10-30 ENCOUNTER — Ambulatory Visit (INDEPENDENT_AMBULATORY_CARE_PROVIDER_SITE_OTHER): Payer: BC Managed Care – PPO | Admitting: Family Medicine

## 2019-10-30 VITALS — BP 110/80 | HR 74 | Temp 98.0°F | Ht 67.5 in | Wt 208.5 lb

## 2019-10-30 DIAGNOSIS — M5416 Radiculopathy, lumbar region: Secondary | ICD-10-CM | POA: Diagnosis not present

## 2019-10-30 NOTE — Progress Notes (Signed)
T. , MD, [facility] [place], [address]  Phone: [phone]  FAX: [place] - 49 y.o. female  MRN [id number]  Date of Birth: 30-Jul-1970  Date: 10/30/2019  PCP: [name], MD  Referral: [name], MD  Chief Complaint  Patient presents with  . Follow-up    Lumbar Radiculopathy    This visit occurred during the SARS-CoV-2 public health emergency.  Safety protocols were in place, including screening questions prior to the visit, additional usage of staff PPE, and extensive cleaning of exam room while observing appropriate contact time as indicated for disinfecting solutions.   Subjective:   Erin Dorsey is a 49 y.o. very pleasant female patient with Body mass index is 32.17 kg/m. who presents with the following:  On her last office visit she was having some lumbar radiculopathy on the right side.  I started her with some 14 days of some prednisone, Flexeril at night, and I gave her some basic spine rehab.  She is here today in follow-up:  75% better now.  She is very happy with her outcome, and she is made significant improvement over the last 6 weeks.  She is not having any numbness or tingling now that is new or different compared to her longstanding numbness in the right foot.  No weakness.  Wt Readings from Last 3 Encounters:  10/30/19 208 lb 8 oz (94.6 kg)  10/09/19 211 lb 12.8 oz (96.1 kg)  10/06/19 211 lb (95.7 kg)      09/10/2019 Last OV with Owens Loffler, MD  She is a very pleasant young 49 year old lady and she presents with a number of ongoing musculoskeletal issues.  She has complaints of right-sided lower back pain and stiffness and some radiation to her buttocks primarily on the right and to a lesser extent on the left.  She has pain in her sacrum as well.   She denies groin pain, but she describes  this as posterior hip pain in the lateral hip pain.   The radiological images were independently reviewed by myself in the office and results were reviewed with the patient. My independent interpretation of images: Joint spaces are preserved without any narrowing or osteophyte formation.  There is no fractures or dislocations.  Grossly normal x-ray of the hip. Electronically Signed  By: Owens Loffler, MD On: 09/10/2019  8:40 AM EDT    She has been doing some physical therapy, but she was having some continued sciatica type symptoms and was recommended to follow-up with me here in the office today.  She was doing physical therapy for posterior buttocks pain.   At baseline she does have some numbness in her right foot secondary to a prior ankle surgery distantly.[name]   r lumb rad piriformis   Posterior buttocks pain.    Doing PT for posterior hip.   Top of tigh Medial foot - baseline gtb b   Review of Systems is noted in the HPI, as appropriate   Objective:   BP 110/80   Pulse 74   Temp 98 F (36.7 C) (Temporal)   Ht 5' 7.5" (1.715 m)   Wt 208 lb 8 oz (94.6 kg)   LMP 06/23/2015   SpO2 96%   BMI 32.17 kg/m    GEN: No acute distress; alert,appropriate. PULM: Breathing comfortably in no respiratory distress PSYCH: Normally interactive.  Range of motion at  the waist: Flexion, extension, lateral bending and rotation: Improved, minimal loss of motion  No echymosis or edema Rises to examination table with mild difficulty Gait: minimally antalgic  Inspection/Deformity: N Paraspinus Tenderness: Modest tender to palpation from the L3-S1  B Ankle Dorsiflexion (L5,4): 5/5 B Great Toe Dorsiflexion (L5,4): 5/5 Heel Walk (L5): WNL Toe Walk (S1): WNL Rise/Squat (L4): WNL, mild pain  SENSORY Global decreased range of motion on the dorsal aspect of the foot and ankle.  This is to pinprick and soft touch. Aside from this, full sensation  REFLEXES Knee (L4): 2+ Ankle (S1):  2+  B SLR, seated: neg B SLR, supine: neg B FABER: neg B Greater Troch: NT B Log Roll: neg B Stork: NT B Sciatic Notch: NT   Radiology: No results found.   Assessment and Plan:     ICD-10-CM   1. Lumbar radiculopathy, acute  M54.16    She is really doing much better.  Continue with weight loss and fitness along with some core work.  Follow-up as needed  Follow-up: No follow-ups on file.  No orders of the defined types were placed in this encounter.  There are no discontinued medications. No orders of the defined types were placed in this encounter.   Signed,  Maud Deed. , MD   Outpatient Encounter Medications as of 10/30/2019  Medication Sig  . acetaminophen (TYLENOL) 325 MG tablet Take 650 mg by mouth every 6 (six) hours as needed for moderate pain.   . cyclobenzaprine (FLEXERIL) 10 MG tablet Take 0.5-1 tablets (5-10 mg total) by mouth at bedtime.  [name] FLUoxetine (PROZAC) 40 MG capsule Take 1 capsule (40 mg total) by mouth daily.  [name] HYDROcodone-acetaminophen (NORCO/VICODIN) 5-325 MG tablet Take 1-2 tablets by mouth every 6 (six) hours as needed for moderate pain.  [name] Hydrocortisone Acetate (VAGISIL) 1 % CREA Apply 1 application topically daily as needed (heat rash).  . nystatin cream (MYCOSTATIN) APPLY TO AFFECTED AREA TWICE A DAY   No facility-administered encounter medications on file as of 10/30/2019.

## 2019-10-30 NOTE — Patient Instructions (Signed)
Keep up what you are doing with your rehab  Work on fitness and weight loss  If you get a little worse again, then I can refill your prednisone.

## 2019-11-04 ENCOUNTER — Encounter: Payer: Self-pay | Admitting: Family Medicine

## 2019-11-11 ENCOUNTER — Other Ambulatory Visit: Payer: Self-pay | Admitting: Family Medicine

## 2019-11-13 ENCOUNTER — Encounter: Payer: Self-pay | Admitting: Family Medicine

## 2019-11-13 ENCOUNTER — Other Ambulatory Visit: Payer: Self-pay | Admitting: Family Medicine

## 2019-11-14 MED ORDER — PREDNISONE 20 MG PO TABS: ORAL_TABLET | ORAL | 0 refills | Status: DC

## 2019-11-17 ENCOUNTER — Ambulatory Visit: Payer: BC Managed Care – PPO | Admitting: Family Medicine

## 2019-12-17 ENCOUNTER — Other Ambulatory Visit: Payer: Self-pay | Admitting: Family Medicine

## 2019-12-18 ENCOUNTER — Other Ambulatory Visit: Payer: Self-pay

## 2019-12-18 MED ORDER — CYCLOBENZAPRINE HCL 10 MG PO TABS: 5.0000 mg | ORAL_TABLET | Freq: Every day | ORAL | 1 refills | Status: DC

## 2019-12-18 NOTE — Telephone Encounter (Signed)
Last OV: 10/30/19 for lumbar pain Last refill: 10/21/19 #30 with 1 Next OV: 12/31/19

## 2019-12-19 ENCOUNTER — Encounter: Payer: Self-pay | Admitting: Family Medicine

## 2019-12-19 DIAGNOSIS — F410 Panic disorder [episodic paroxysmal anxiety] without agoraphobia: Secondary | ICD-10-CM

## 2019-12-19 DIAGNOSIS — F411 Generalized anxiety disorder: Secondary | ICD-10-CM

## 2019-12-19 MED ORDER — HYDROXYZINE HCL 25 MG PO TABS: 25.0000 mg | ORAL_TABLET | Freq: Three times a day (TID) | ORAL | 1 refills | Status: DC | PRN

## 2019-12-26 ENCOUNTER — Encounter: Payer: Self-pay | Admitting: Family Medicine

## 2019-12-29 ENCOUNTER — Other Ambulatory Visit: Payer: Self-pay | Admitting: Family Medicine

## 2019-12-29 DIAGNOSIS — B372 Candidiasis of skin and nail: Secondary | ICD-10-CM

## 2019-12-30 MED ORDER — NYSTATIN 100000 UNIT/GM EX CREA: TOPICAL_CREAM | Freq: Two times a day (BID) | CUTANEOUS | 0 refills | Status: DC

## 2019-12-30 NOTE — Addendum Note (Signed)
Addended by: [name] on: 12/30/2019 02:45 PM   Modules accepted: Orders

## 2019-12-31 ENCOUNTER — Ambulatory Visit: Payer: BC Managed Care – PPO | Admitting: Family Medicine

## 2020-01-01 ENCOUNTER — Telehealth: Payer: Self-pay | Admitting: General Practice

## 2020-01-01 NOTE — Telephone Encounter (Signed)
  Called patient to schedule an appointment in regards to a referral we received. I have left a voice mail for patient to return call. Referral is attached for reference. -Maggie

## 2020-01-07 ENCOUNTER — Other Ambulatory Visit: Payer: Self-pay

## 2020-01-07 ENCOUNTER — Ambulatory Visit (INDEPENDENT_AMBULATORY_CARE_PROVIDER_SITE_OTHER): Payer: BC Managed Care – PPO | Admitting: Family Medicine

## 2020-01-07 ENCOUNTER — Encounter: Payer: Self-pay | Admitting: Family Medicine

## 2020-01-07 ENCOUNTER — Ambulatory Visit (INDEPENDENT_AMBULATORY_CARE_PROVIDER_SITE_OTHER)
Admission: RE | Admit: 2020-01-07 | Discharge: 2020-01-07 | Disposition: A | Discharge status: Home / Self Care | Payer: BC Managed Care – PPO | Source: Ambulatory Visit | Attending: Family Medicine | Admitting: Family Medicine

## 2020-01-07 ENCOUNTER — Other Ambulatory Visit: Payer: Self-pay | Admitting: Family Medicine

## 2020-01-07 VITALS — BP 118/88 | HR 74 | Temp 98.1°F | Ht 67.5 in | Wt 210.0 lb

## 2020-01-07 DIAGNOSIS — M5416 Radiculopathy, lumbar region: Secondary | ICD-10-CM

## 2020-01-07 DIAGNOSIS — M4727 Other spondylosis with radiculopathy, lumbosacral region: Secondary | ICD-10-CM | POA: Diagnosis not present

## 2020-01-07 DIAGNOSIS — Z23 Encounter for immunization: Secondary | ICD-10-CM

## 2020-01-07 DIAGNOSIS — R200 Anesthesia of skin: Secondary | ICD-10-CM

## 2020-01-07 DIAGNOSIS — M5117 Intervertebral disc disorders with radiculopathy, lumbosacral region: Secondary | ICD-10-CM | POA: Diagnosis not present

## 2020-01-07 DIAGNOSIS — M5116 Intervertebral disc disorders with radiculopathy, lumbar region: Secondary | ICD-10-CM | POA: Diagnosis not present

## 2020-01-07 DIAGNOSIS — F331 Major depressive disorder, recurrent, moderate: Secondary | ICD-10-CM

## 2020-01-07 DIAGNOSIS — M4726 Other spondylosis with radiculopathy, lumbar region: Secondary | ICD-10-CM | POA: Diagnosis not present

## 2020-01-07 MED ORDER — AMITRIPTYLINE HCL 25 MG PO TABS: 25.0000 mg | ORAL_TABLET | Freq: Every day | ORAL | 3 refills | Status: DC

## 2020-01-07 MED ORDER — PREDNISONE 20 MG PO TABS: ORAL_TABLET | ORAL | 0 refills | Status: DC

## 2020-01-07 MED ORDER — DIAZEPAM 2 MG PO TABS: ORAL_TABLET | ORAL | 0 refills | Status: DC

## 2020-01-07 NOTE — Telephone Encounter (Signed)
Last OV with you: 10/06/19 wanting to see her back in 6 weeks.  Last refill: 10/21/19 #90 with 0  No future appts scheduled.

## 2020-01-07 NOTE — Progress Notes (Signed)
T. , MD, [facility] [place], [address]  Phone: [phone]   FAX: [place] - 49 y.o. female   MRN [id number]   Date of Birth: Sep 21, 1970  Date: 01/07/2020   PCP: [name], MD   Referral: [name], MD  Chief Complaint  Patient presents with   Hip Pain    right side   Leg Pain    This visit occurred during the SARS-CoV-2 public health emergency.  Safety protocols were in place, including screening questions prior to the visit, additional usage of staff PPE, and extensive cleaning of exam room while observing appropriate contact time as indicated for disinfecting solutions.   Subjective:   Erin Dorsey is a 49 y.o. very pleasant female patient with Body mass index is 32.41 kg/m. who presents with the following:  I initially saw her in 08/2019, and at that point she was having back pain with radiating pain to the buttocks on the right. She has done a round of PT, and had several rounds of steroids.   R sided lumbar radiculopathy: 08/2019, pred x 14 days Second round of pred x 14 d  Right side and hitting.  Lateral side pain.   She now will have some radicular pain down her R leg, and she has a numb sensation on the top of her foot as well as her lateral R thigh.  The dorsum of the foot has decreased sensation at baseline from prior foot surgery.  R leg will cramp up and will sometimes.   Will fall sometimes, but not know why.   Decreased dorsal sensation of the R foot - foot from prior surgery on foot Lateral thigh with some decreased sensation.  On her last office visit she was having some lumbar radiculopathy on the right side.  I started her with some 14 days of some prednisone, Flexeril at night, and I gave her some basic spine rehab.    12/29/2019 Last OV with Owens Loffler, MD  She is here today in  follow-up:   75% better now.  She is very happy with her outcome, and she is made significant improvement over the last 6 weeks.  She is not having any numbness or tingling now that is new or different compared to her longstanding numbness in the right foot.  No weakness.      Wt Readings from Last 3 Encounters:  10/30/19 208 lb 8 oz (94.6 kg)  10/09/19 211 lb 12.8 oz (96.1 kg)  10/06/19 211 lb (95.7 kg)        09/10/2019 Last OV with Owens Loffler, MD  She is a very pleasant young 49 year old lady and she presents with a number of ongoing musculoskeletal issues.  She has complaints of right-sided lower back pain and stiffness and some radiation to her buttocks primarily on the right and to a lesser extent on the left.  She has pain in her sacrum as well.   She denies groin pain, but she describes this as posterior hip pain in the lateral hip pain.   The radiological images were independently reviewed by myself in the office and results were reviewed with the patient. My independent interpretation of images: Joint spaces are preserved without any narrowing or osteophyte formation.  There is no fractures or dislocations.  Grossly normal x-ray of the hip. Electronically Signed  By: Frederico Hamman  , MD On: 09/10/2019  8:40 AM EDT    She has been doing some physical therapy, but she was having some continued sciatica type symptoms and was recommended to follow-up with me here in the office today.  She was doing physical therapy for posterior buttocks pain.   At baseline she does have some numbness in her right foot secondary to a prior ankle surgery distantly.Erin Dorsey   r lumb rad piriformis   Posterior buttocks pain.    Doing PT for posterior hip.   Top of tigh Medial foot - baseline gtb b   She is a very pleasant young 49 year old lady and she presents with a number of ongoing musculoskeletal issues.  She has complaints of right-sided lower back pain and stiffness and some radiation to her  buttocks primarily on the right and to a lesser extent on the left.  She has pain in her sacrum as well.   She denies groin pain, but she describes this as posterior hip pain in the lateral hip pain.   The radiological images were independently reviewed by myself in the office and results were reviewed with the patient. My independent interpretation of images: Joint spaces are preserved without any narrowing or osteophyte formation.  There is no fractures or dislocations.  Grossly normal x-ray of the hip. Electronically Signed  By: Owens Loffler, MD On: 09/10/2019  8:40 AM EDT    She has been doing some physical therapy, but she was having some continued sciatica type symptoms and was recommended to follow-up with me here in the office today.  She was doing physical therapy for posterior buttocks pain.   At baseline she does have some numbness in her right foot secondary to a prior ankle surgery distantly.Erin Dorsey   r lumb rad piriformis   Posterior buttocks pain.    Doing PT for posterior hip.   Top of tigh Medial foot - baseline gtb b   Review of Systems is noted in the HPI, as appropriate   Objective:   BP 118/88    Pulse 74    Temp 98.1 F (36.7 C) (Temporal)    Ht 5' 7.5" (1.715 m)    Wt 210 lb (95.3 kg)    LMP 06/23/2015    SpO2 97%    BMI 32.41 kg/m    GEN: No acute distress; alert,appropriate. PULM: Breathing comfortably in no respiratory distress PSYCH: Normally interactive.   Range of motion at  the waist: Flexion, extension, lateral bending and rotation: She does have pain with terminal flexion and extension and she is limited in flexion to approximately 90 degrees.  Lateral bending and rotation are preserved.  No echymosis or edema Rises to examination table with mild difficulty Gait: minimally antalgic  Inspection/Deformity: N Paraspinus Tenderness: From L2-S1 bilaterally, worse on the right  B Ankle Dorsiflexion (L5,4): 5/5 B Great Toe Dorsiflexion (L5,4):  5/5 Heel Walk (L5): WNL Toe Walk (S1): WNL Rise/Squat (L4): WNL, mild pain  SENSORY B Medial Foot (L4): WNL B Dorsum (L5): Decreased on the right compared to the left B Lateral (S1): WNL, at the foot, but decreased on the lateral thigh to both soft touch and pinprick  REFLEXES Knee (L4): 2+ Ankle (S1): 2+  B SLR, seated: neg B SLR, supine: Pain B FABER: Pain B Reverse FABER: Pain B Greater Troch: NT B Log Roll: neg B Sciatic Notch: Tender   radiology: DG Lumbar Spine Complete  Result Date: 01/08/2020 CLINICAL DATA:  Lumbar radiculopathy EXAM: LUMBAR SPINE -  COMPLETE 4+ VIEW COMPARISON:  None. 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 Normal alignment. No fracture. SI joints symmetric and unremarkable. IMPRESSION: Degenerative changes at L5-S1.  No acute bony abnormality. Electronically Signed   By: Rolm Baptise M.D.   On: 01/08/2020 02:33    Assessment and Plan:     ICD-10-CM   1. Lumbar radiculopathy, acute  M54.16 DG Lumbar Spine Complete    MR Lumbar Spine Wo Contrast  2. Numbness of right anterior thigh  R20.0 MR Lumbar Spine Wo Contrast  3. Numbness of right foot  R20.0 MR Lumbar Spine Wo Contrast  4. Need for influenza vaccination  Z23 Flu Vaccine QUAD 6+ mos PF IM (Fluarix Quad PF)   Worsening back pain with progression of symptoms.  Failure of conservative management including several rounds of steroids, physical therapy, NSAIDs, home rehab.  Obtain an MRI of the lumbar spine without contrast.  Additional plan of care depends on MRI findings.  Consideration of potential consultation with pain versus PMR for additional intervention.  Decreased thigh sensation.  Decreased dorsum of the foot sensation, but per report this is at baseline.  Add Elavil for neuropathic pain.  Follow-up: No follow-ups on file.  Meds ordered this encounter  Medications   amitriptyline (ELAVIL) 25 MG tablet    Sig: Take 1-2 tablets (25-50 mg total) by mouth at  bedtime.    Dispense:  60 tablet    Refill:  3   predniSONE (DELTASONE) 20 MG tablet    Sig: 2 tabs po for 7 days, then 1 tab po for 7 days    Dispense:  21 tablet    Refill:  0   diazepam (VALIUM) 2 MG tablet    Sig: 1 tablet 45 minutes before MRI    Dispense:  1 tablet    Refill:  0   Medications Discontinued During This Encounter  Medication Reason   predniSONE (DELTASONE) 20 MG tablet    cyclobenzaprine (FLEXERIL) 10 MG tablet Duplicate   cyclobenzaprine (FLEXERIL) 10 MG tablet    Orders Placed This Encounter  Procedures   DG Lumbar Spine Complete   MR Lumbar Spine Wo Contrast   Flu Vaccine QUAD 6+ mos PF IM (Fluarix Quad PF)    Signed,   T. , MD   Outpatient Encounter Medications as of 01/07/2020  Medication Sig   acetaminophen (TYLENOL) 325 MG tablet Take 650 mg by mouth every 6 (six) hours as needed for moderate pain.    clindamycin (CLEOCIN) 150 MG capsule Take by mouth 3 (three) times daily.   doxycycline (VIBRAMYCIN) 100 MG capsule Take 100 mg by mouth 2 (two) times daily.   Hydrocortisone Acetate (VAGISIL) 1 % CREA Apply 1 application topically daily as needed (heat rash).   hydrOXYzine (ATARAX/VISTARIL) 25 MG tablet Take 1-2 tablets (25-50 mg total) by mouth 3 (three) times daily as needed for anxiety.   [DISCONTINUED] cyclobenzaprine (FLEXERIL) 10 MG tablet Take 0.5-1 tablets (5-10 mg total) by mouth at bedtime.   [DISCONTINUED] FLUoxetine (PROZAC) 40 MG capsule Take 1 capsule (40 mg total) by mouth daily.   [DISCONTINUED] nystatin cream (MYCOSTATIN) Apply topically 2 (two) times daily.   amitriptyline (ELAVIL) 25 MG tablet Take 1-2 tablets (25-50 mg total) by mouth at bedtime.   diazepam (VALIUM) 2 MG tablet 1 tablet 45 minutes before MRI   HYDROcodone-acetaminophen (NORCO/VICODIN) 5-325 MG tablet Take 1-2 tablets by mouth every 6 (six) hours as needed for moderate pain. (Patient not taking: Reported on  01/07/2020)   predniSONE  (DELTASONE) 20 MG tablet 2 tabs po for 7 days, then 1 tab po for 7 days   [DISCONTINUED] cyclobenzaprine (FLEXERIL) 10 MG tablet SMARTSIG:0.5-1 Tablet(s) By Mouth Every Night   [DISCONTINUED] predniSONE (DELTASONE) 20 MG tablet 2 tabs po for 7 days, then 1 tab po for 7 days   No facility-administered encounter medications on file as of 01/07/2020.

## 2020-01-09 ENCOUNTER — Ambulatory Visit (INDEPENDENT_AMBULATORY_CARE_PROVIDER_SITE_OTHER): Payer: BC Managed Care – PPO | Admitting: Certified Nurse Midwife

## 2020-01-09 ENCOUNTER — Encounter: Payer: Self-pay | Admitting: Certified Nurse Midwife

## 2020-01-09 ENCOUNTER — Other Ambulatory Visit: Payer: Self-pay

## 2020-01-09 VITALS — BP 120/88 | HR 81 | Ht 67.5 in | Wt 207.7 lb

## 2020-01-09 DIAGNOSIS — B372 Candidiasis of skin and nail: Secondary | ICD-10-CM | POA: Diagnosis not present

## 2020-01-09 MED ORDER — FLUCONAZOLE 200 MG PO TABS: 200.0000 mg | ORAL_TABLET | Freq: Every day | ORAL | 1 refills | Status: DC

## 2020-01-09 MED ORDER — NYSTATIN 100000 UNIT/GM EX POWD
1.0000 | Freq: Three times a day (TID) | CUTANEOUS | 0 refills | Status: DC
Start: 2020-01-09 — End: 2020-01-22

## 2020-01-09 NOTE — Patient Instructions (Addendum)
Nystatin topical powder What is this medicine? NYSTATIN (nye STAT in) is an antifungal medicine. It is used to treat certain kinds of fungal or yeast infections of the skin. This medicine may be used for other purposes; ask your health care provider or pharmacist if you have questions. COMMON BRAND NAME(S): Mycostatin, Nyamyc, Nyata, Nystop, Pedi-Dri What should I tell my health care provider before I take this medicine? They need to know if you have any of these conditions:  an unusual or allergic reaction to nystatin, other foods, dyes or preservatives  pregnant or trying to get pregnant  breast-feeding How should I use this medicine? This medicine is for external use on the skin only. Follow the directions on the prescription label. Dust the powder on the affected area (or into socks and shoes). If you are treating diaper rash, do not use tight-fitting diapers or plastic pants. Do not get the medicine in your eyes. If you do, rinse out with plenty of cool tap water. Do not breathe in the powder. Do not use your medicine more often than directed. Use your doses at regular intervals. Finish the full course prescribed by your doctor or health care professional even if you think your condition is better. Do not stop using except on the advice of your doctor or health care professional. Talk to your pediatrician regarding the use of this medicine in children. Special care may be needed. Overdosage: If you think you have taken too much of this medicine contact a poison control center or emergency room at once. NOTE: This medicine is only for you. Do not share this medicine with others. What if I miss a dose? If you miss a dose, use it as soon as you can. If it is almost time for your next dose, use only that dose. Do not use double or extra doses. What may interact with this medicine? Interactions are not expected. Do not use any other skin products on the affected area without telling your doctor  or health care professional. This list may not describe all possible interactions. Give your health care provider a list of all the medicines, herbs, non-prescription drugs, or dietary supplements you use. Also tell them if you smoke, drink alcohol, or use illegal drugs. Some items may interact with your medicine. What should I watch for while using this medicine? Tell your doctor or health care professional if your symptoms do not improve after 3 days. After bathing make sure that your skin is very dry. Fungal infections like moist conditions. Do not walk around barefoot. To help prevent reinfection, wear freshly washed cotton, not synthetic, clothing. What side effects may I notice from receiving this medicine? Side effects that you should report to your doctor or health care professional as soon as possible:  allergic reactions like skin rash, itching or hives, swelling of the face, lips, or tongue Side effects that usually do not require medical attention (report to your doctor or health care professional if they continue or are bothersome):  skin irritation This list may not describe all possible side effects. Call your doctor for medical advice about side effects. You may report side effects to FDA at 1-800-FDA-1088. Where should I keep my medicine? Keep out of the reach of children. Store at room temperature between 15 and 30 degrees C (59 and 86 degrees F). Throw away any unused medicine after the expiration date. NOTE: This sheet is a summary. It may not cover all possible information. If you have questions  about this medicine, talk to your doctor, pharmacist, or health care provider.  2020 Elsevier/Gold Standard (2015-05-20 10:36:02)   Fluconazole tablets What is this medicine? FLUCONAZOLE (floo KON na zole) is an antifungal medicine. It is used to treat certain kinds of fungal or yeast infections. This medicine may be used for other purposes; ask your health care provider or  pharmacist if you have questions. COMMON BRAND NAME(S): Diflucan What should I tell my health care provider before I take this medicine? They need to know if you have any of these conditions:  history of irregular heart beat  kidney disease  an unusual or allergic reaction to fluconazole, other azole antifungals, medicines, foods, dyes, or preservatives  pregnant or trying to get pregnant  breast-feeding How should I use this medicine? Take this medicine by mouth. Follow the directions on the prescription label. Do not take your medicine more often than directed. Talk to your pediatrician regarding the use of this medicine in children. Special care may be needed. This medicine has been used in children as young as 76 months of age. Overdosage: If you think you have taken too much of this medicine contact a poison control center or emergency room at once. NOTE: This medicine is only for you. Do not share this medicine with others. What if I miss a dose? If you miss a dose, take it as soon as you can. If it is almost time for your next dose, take only that dose. Do not take double or extra doses. What may interact with this medicine? Do not take this medicine with any of the following medications:  astemizole  certain medicines for irregular heart beat like dronedarone, quinidine  cisapride  erythromycin  lomitapide  other medicines that prolong the QT interval (cause an abnormal heart rhythm)  pimozide  terfenadine  thioridazine This medicine may also interact with the following medications:  antiviral medicines for HIV or AIDS  birth control pills  certain antibiotics like rifabutin, rifampin  certain medicines for blood pressure like amlodipine, isradipine, felodipine, hydrochlorothiazide, losartan, nifedipine  certain medicines for cancer like cyclophosphamide, ibrutinib, vinblastine, vincristine  certain medicines for cholesterol like atorvastatin, lovastatin,  fluvastatin, simvastatin  certain medicines for depression, anxiety, or psychotic disturbances like amitriptyline, midazolam, nortriptyline, triazolam  certain medicines for diabetes like glipizide, glyburide, tolbutamide  certain medicines for pain like alfentanil, fentanyl, methadone  certain medicines for seizures like carbamazepine, phenytoin  certain medicines that treat or prevent blood clots like warfarin  dofetilide  halofantrine  medicines that lower your chance of fighting infection like cyclosporine, prednisone, tacrolimus  NSAIDS, medicines for pain and inflammation, like celecoxib, diclofenac, flurbiprofen, ibuprofen, meloxicam, naproxen  other medicines for fungal infections  sirolimus  theophylline  tofacitinib  tolvaptan  ziprasidone This list may not describe all possible interactions. Give your health care provider a list of all the medicines, herbs, non-prescription drugs, or dietary supplements you use. Also tell them if you smoke, drink alcohol, or use illegal drugs. Some items may interact with your medicine. What should I watch for while using this medicine? Visit your doctor or health care professional for regular checkups. If you are taking this medicine for a long time you may need blood work. Tell your doctor if your symptoms do not improve. Some fungal infections need many weeks or months of treatment to cure. Alcohol can increase possible damage to your liver. Avoid alcoholic drinks. If you have a vaginal infection, do not have sex until you have finished your  treatment. You can wear a sanitary napkin. Do not use tampons. Wear freshly washed cotton, not synthetic, panties. What side effects may I notice from receiving this medicine? Side effects that you should report to your doctor or health care professional as soon as possible:  allergic reactions like skin rash or itching, hives, swelling of the lips, mouth, tongue, or throat  dark  urine  feeling dizzy or faint  irregular heartbeat or chest pain  redness, blistering, peeling or loosening of the skin, including inside the mouth  trouble breathing  unusual bruising or bleeding  vomiting  yellowing of the eyes or skin Side effects that usually do not require medical attention (report to your doctor or health care professional if they continue or are bothersome):  changes in how food tastes  diarrhea  headache  stomach upset or nausea This list may not describe all possible side effects. Call your doctor for medical advice about side effects. You may report side effects to FDA at 1-800-FDA-1088. Where should I keep my medicine? Keep out of the reach of children. Store at room temperature below 30 degrees C (86 degrees F). Throw away any medicine after the expiration date. NOTE: This sheet is a summary. It may not cover all possible information. If you have questions about this medicine, talk to your doctor, pharmacist, or health care provider.  2020 Elsevier/Gold Standard (2019-01-09 11:41:56)  Hysterectomy Information  A hysterectomy is a surgery to remove your uterus. After surgery, you will no longer have periods. Also, you will no longer be able to get pregnant. Reasons for this surgery You may have this surgery if:  You have bleeding in your vagina: ? That is not normal. ? That does not stop, or that keeps coming back.  You have long-term (chronic) pain in your lower belly (pelvic area).  The lining of your uterus grows outside of the uterus (endometriosis).  The lining of your uterus grows in the muscle of the uterus (adenomyosis).  Your uterus falls down into your vagina (prolapse).  You have a growth in your uterus that causes problems (uterine fibroids).  You have cells that could turn into cancer (precancerous cells).  You have cancer of the uterus or cervix. Types of hysterectomies There are 3 types of hysterectomies. Depending on  the type, the surgery will:  Remove the top part of the uterus (supracervical).  Remove the uterus and the cervix (total).  Remove the uterus, cervix, and tissue that holds the uterus in place (radical). Ways a hysterectomy can be done This surgery may be done in one of these ways:  A cut (incision) is made in the belly (abdomen). The uterus is taken out through the cut.  A cut is made in the vagina. The uterus is taken out through the cut.  Three or four cuts are made in the belly. A device with a camera is put through one of the cuts. The uterus is cut into pieces and taken out through the cuts or the vagina.  Three or four cuts are made in the belly. A device with a camera is put through one of the cuts. The uterus is taken out through the vagina.  Three or four cuts are made in the belly. A computer helps control the surgical tools. The uterus is cut into small pieces. The pieces are taken out through the cuts or through the vagina. Talk with your doctor about which way is best for you. Risks of hysterectomy Generally, this surgery  is safe. However, problems can happen, including:  Bleeding.  Needing donated blood (transfusion).  Blood clots.  Infection.  Damage to other structures or organs.  Allergic reactions.  Needing to switch to a different type of surgery. What to expect after surgery  You will be given pain medicine.  You will need to stay in the hospital for 1-2 days.  Follow your doctor's instructions about: ? Exercising. ? Driving. ? What activities are safe for you.  You will need to have someone with you at home for 3-5 days.  You will need to see your doctor after 2-4 weeks.  You may get hot flashes, have night sweats, and have trouble sleeping.  You may need to have Pap tests if your surgery was related to cancer. Talk with your doctor about how often you need Pap tests. Questions to ask your doctor  Do I need this surgery? Do I have other  treatment options?  What are my options for this surgery?  What needs to be removed?  What are the risks?  What are the benefits?  How long will I need to stay in the hospital?  How long will I need to recover?  What symptoms can I expect after the procedure? Summary  A hysterectomy is a surgery to remove your uterus. After surgery, you will no longer have periods. Also, you will no longer be able to get pregnant.  Talk with your doctor about which type of hysterectomy is best for you. This information is not intended to replace advice given to you by your health care provider. Make sure you discuss any questions you have with your health care provider. Document Revised: 06/20/2018 Document Reviewed: 07/18/2016 Elsevier Patient Education  [place].

## 2020-01-11 ENCOUNTER — Other Ambulatory Visit: Payer: Self-pay | Admitting: Family Medicine

## 2020-01-11 DIAGNOSIS — F331 Major depressive disorder, recurrent, moderate: Secondary | ICD-10-CM

## 2020-01-11 NOTE — Progress Notes (Signed)
GYN ENCOUNTER NOTE  Subjective:       Erin Dorsey is a 49 y.o. female here for evaluation of skin rash under breast and in fold of groin with sour smell, previously treated by PCP with nystatin cream and unresolved.   Patient keeps area clean and dry. "Air it out" from time to time as well.   Also, patient wishes to discuss hysterectomy. Reports physicians at [facility] removed a large mass from her uterus and recommended a hysterectomy in 2012, but patient has not been able to obtained the records and recently qualified for insurance.   Denies difficulty breathing or respiratory distress, chest pain, abdominal pain, vaginal bleeding, dysuria, and leg pain or swelling.    Gynecologic History  Patient's last menstrual period was 06/23/2015.  Contraception: tubal ligation  Last Pap: 07/2019. Results were: Neg/Neg  Last mammogram: Declined at [facility] in 07/2019  Past Medical History:  Diagnosis Date  . Allergy   . Anxiety   . Cervical cancer ([place]) 2011   stage 3  . Chronic headaches   . Depression   . Frequent falls   . GERD (gastroesophageal reflux disease)     Past Surgical History:  Procedure Laterality Date  . COLPOSCOPY  2011 or 2012   stage 3-removed cancerous spot on the cervix  . EXCISION OF TONGUE LESION N/A 10/15/2019   Procedure: EXCISION OF TONGUE LESION;  Surgeon: Clyde Canterbury, MD;  Location: [facility] ORS;  Service: ENT;  Laterality: N/A;  . FOOT SURGERY Right    x 2-took knots out from bottom of the foot-not sure of the technical name  . PAROTIDECTOMY Right 10/15/2019   Procedure: PAROTIDECTOMY;  Surgeon: Clyde Canterbury, MD;  Location: [facility] ORS;  Service: ENT;  Laterality: Right;  . TUBAL LIGATION      Current Outpatient Medications on File Prior to Visit  Medication Sig Dispense Refill  . acetaminophen (TYLENOL) 325 MG tablet Take 650 mg by mouth every 6 (six) hours as needed for moderate pain.     [name] amitriptyline (ELAVIL) 25 MG tablet Take  1-2 tablets (25-50 mg total) by mouth at bedtime. 60 tablet 3  . clindamycin (CLEOCIN) 150 MG capsule Take by mouth 3 (three) times daily.    . diazepam (VALIUM) 2 MG tablet 1 tablet 45 minutes before MRI 1 tablet 0  . doxycycline (VIBRAMYCIN) 100 MG capsule Take 100 mg by mouth 2 (two) times daily.    [name] FLUoxetine (PROZAC) 40 MG capsule TAKE 1 CAPSULE BY MOUTH EVERY DAY 90 capsule 0  . Hydrocortisone Acetate (VAGISIL) 1 % CREA Apply 1 application topically daily as needed (heat rash).    . hydrOXYzine (ATARAX/VISTARIL) 25 MG tablet Take 1-2 tablets (25-50 mg total) by mouth 3 (three) times daily as needed for anxiety. 30 tablet 1  . predniSONE (DELTASONE) 20 MG tablet 2 tabs po for 7 days, then 1 tab po for 7 days 21 tablet 0  . HYDROcodone-acetaminophen (NORCO/VICODIN) 5-325 MG tablet Take 1-2 tablets by mouth every 6 (six) hours as needed for moderate pain. (Patient not taking: Reported on 01/07/2020) 30 tablet 0  . [DISCONTINUED] FLUoxetine (PROZAC) 40 MG capsule Take 1 capsule (40 mg total) by mouth daily. 90 capsule 0   No current facility-administered medications on file prior to visit.    Allergies  Allergen Reactions  . Amoxicillin Rash  . Penicillins Rash    Social History   Socioeconomic History  . Marital status: Married    Spouse name:  [name]  . Number of children: 3  . Years of education: 7th grade  . Highest education level: Not on file  Occupational History  . Not on file  Tobacco Use  . Smoking status: Former Smoker    Packs/day: 0.25    Years: 20.00    Pack years: 5.00    Types: Cigarettes    Quit date: 09/01/2019    Years since quitting: 0.3  . Smokeless tobacco: Never Used  . Tobacco comment: not daily  Vaping Use  . Vaping Use: Never used  Substance and Sexual Activity  . 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 Drug use: Not Currently    Frequency: 1.0 times per week    Types: Marijuana  . Sexual activity: Not Currently    Birth  control/protection: Surgical    Comment: tubal ligation  Other Topics Concern  . Not on file  Social History Narrative   08/06/19   From: [place], came here to be close to daughter   Living: with husband [name] together since 2011 and married since 2021   Work: not currently, use to work at [employer]: 3 daughter - in [place] - ok relationship - 1 grandchild / with [name] has 18 step grandchildren      Enjoys: walk, watch movies, painting, cross stitch      Exercise: walking   Diet: eats whatever      Safety   Seat belts: Yes    Guns: No   Safe in relationships: Yes    Social Determinants of Health   Financial Resource Strain:   . Difficulty of Paying Living Expenses: Not on file  Food Insecurity:   . Worried About Charity fundraiser in the Last Year: Not on file  . Ran Out of Food in the Last Year: Not on file  Transportation Needs:   . Lack of Transportation (Medical): Not on file  . Lack of Transportation (Non-Medical): Not on file  Physical Activity:   . Days of Exercise per Week: Not on file  . Minutes of Exercise per Session: Not on file  Stress:   . Feeling of Stress : Not on file  Social Connections:   . Frequency of Communication with Friends and Family: Not on file  . Frequency of Social Gatherings with Friends and Family: Not on file  . Attends Religious Services: Not on file  . Active Member of Clubs or Organizations: Not on file  . Attends Archivist Meetings: Not on file  . Marital Status: Not on file  Intimate Partner Violence:   . Fear of Current or Ex-Partner: Not on file  . Emotionally Abused: Not on file  . Physically Abused: Not on file  . Sexually Abused: Not on file    Family History  Problem Relation Age of Onset  . Hypertension Mother   . Kidney disease Father        was on dialysis  . Uterine cancer Sister   . Cervical cancer Sister   . Deafness Daughter   . Cervical cancer Maternal Grandmother   . Lung  cancer Maternal Grandfather   . Brain cancer Maternal Grandfather   . Thyroid disease Daughter   . Breast cancer Paternal Aunt     The following portions of the patient's history were reviewed and updated as appropriate: allergies, current medications, past family history, past medical history, past social history, past surgical history and problem list.  Review of Systems  ROS negative except as noted above. Information obtained from patient.   Objective:   BP 120/88   Pulse 81   Ht 5' 7.5" (1.715 m)   Wt 207 lb 11.2 oz (94.2 kg)   LMP 06/23/2015   BMI 32.05 kg/m    CONSTITUTIONAL: Well-developed, well-nourished female in no acute distress.    SKIN: Skin is warm and dry. Not diaphoretic.No pallor. Red, flat, spotted rash located under and between breasts.   MUSCULOSKELETAL: Normal range of motion. No tenderness.  No cyanosis, clubbing, or edema.     Assessment:   1. Skin yeast infection   Plan:   Rx Diflucan and Nystatin powder, see orders.   Reviewed red flag symptoms and when to call.   RTC to discuss hysterectomy with MD or sooner if needed.    Dani Gobble, CNM [facility], [facility]   A total of 20 minutes were spent face-to-face with the patient during the encounter with greater than 50% dealing with counseling and coordination of care.

## 2020-01-12 ENCOUNTER — Other Ambulatory Visit: Payer: Self-pay | Admitting: Certified Nurse Midwife

## 2020-01-12 DIAGNOSIS — A6000 Herpesviral infection of urogenital system, unspecified: Secondary | ICD-10-CM | POA: Insufficient documentation

## 2020-01-12 MED ORDER — VALACYCLOVIR HCL 500 MG PO TABS: 500.0000 mg | ORAL_TABLET | Freq: Every day | ORAL | 12 refills | Status: DC

## 2020-01-12 NOTE — Progress Notes (Signed)
Rx Valtrex, see orders.    Dani Gobble, CNM [facility], [facility] 01/12/20 3:25 PM

## 2020-01-22 ENCOUNTER — Ambulatory Visit: Payer: BC Managed Care – PPO | Admitting: Family Medicine

## 2020-01-22 ENCOUNTER — Other Ambulatory Visit: Payer: Self-pay

## 2020-01-25 ENCOUNTER — Encounter: Payer: Self-pay | Admitting: Family Medicine

## 2020-01-25 ENCOUNTER — Other Ambulatory Visit: Payer: Self-pay | Admitting: Certified Nurse Midwife

## 2020-01-26 MED ORDER — NYSTATIN 100000 UNIT/GM EX POWD: 1.0000 "application " | Freq: Three times a day (TID) | CUTANEOUS | 0 refills | Status: DC

## 2020-01-26 NOTE — Telephone Encounter (Signed)
He has an MRI scheduled 9/30 - no real need to f/u until after that.

## 2020-01-28 ENCOUNTER — Encounter: Payer: Self-pay | Admitting: Obstetrics and Gynecology

## 2020-01-28 ENCOUNTER — Ambulatory Visit (INDEPENDENT_AMBULATORY_CARE_PROVIDER_SITE_OTHER): Payer: BC Managed Care – PPO | Admitting: Obstetrics and Gynecology

## 2020-01-28 ENCOUNTER — Other Ambulatory Visit: Payer: Self-pay

## 2020-01-28 VITALS — BP 115/84 | HR 90 | Ht 67.0 in | Wt 214.3 lb

## 2020-01-28 DIAGNOSIS — N941 Unspecified dyspareunia: Secondary | ICD-10-CM

## 2020-01-28 DIAGNOSIS — R102 Pelvic and perineal pain: Secondary | ICD-10-CM

## 2020-01-28 DIAGNOSIS — Z87410 Personal history of cervical dysplasia: Secondary | ICD-10-CM

## 2020-01-28 NOTE — Progress Notes (Signed)
HPI:      [name] is a 49 y.o. No obstetric history on file. who LMP was Patient's last menstrual period was 06/23/2015.  Subjective:   She presents today because she states that she has pain 2-3 times per week and wants to schedule surgery.  She is unaware of any diagnosis.  She states that a piece was removed from her a few years ago.  Upon further questioning it sounds as if she had cervical dysplasia and underwent either LEEP or conization of the cervix. She reports her pain to be worse with intercourse. She describes no problems with bowel movements. She does occasionally have some urine loss when her bladder is full and with coughing laughing and sneezing. I asked her about fibroids and she has no recollection of having fibroids. She states that she has not had a period in more than 2 years and believes that she is in menopause. She describes 3 uneventful vaginal births with all babies weighing under 8 pounds.    Hx: The following portions of the patient's history were reviewed and updated as appropriate:             She  has a past medical history of Allergy, Anxiety, Cervical cancer ([place]) (2011), Chronic headaches, Depression, Frequent falls, and GERD (gastroesophageal reflux disease). She does not have any pertinent problems on file. She  has a past surgical history that includes Foot surgery (Right); Tubal ligation; Colposcopy (2011 or 2012); Parotidectomy (Right, 10/15/2019); and Excision of tongue lesion (N/A, 10/15/2019). Her family history includes Brain cancer in her maternal grandfather; Breast cancer in her paternal aunt; Cervical cancer in her maternal grandmother and sister; Deafness in her daughter; Hypertension in her mother; Kidney disease in her father; Lung cancer in her maternal grandfather; Thyroid disease in her daughter; Uterine cancer in her sister. She  reports that she quit smoking about 4 months ago. Her smoking use included cigarettes. She has a 5.00  pack-year smoking history. She has never used smokeless tobacco. She reports previous drug use. Frequency: 1.00 time per week. Drug: Marijuana. She reports that she does not drink alcohol. She has a current medication list which includes the following prescription(s): acetaminophen, amitriptyline, clindamycin, diazepam, doxycycline, fluconazole, fluoxetine, hydrocortisone acetate, hydroxyzine, nystatin, valacyclovir, prednisone, and [DISCONTINUED] fluoxetine. She is allergic to amoxicillin and penicillins.       Review of Systems:  Review of Systems  Constitutional: Denied constitutional symptoms, night sweats, recent illness, fatigue, fever, insomnia and weight loss.  Eyes: Denied eye symptoms, eye pain, photophobia, vision change and visual disturbance.  Ears/Nose/Throat/Neck: Denied ear, nose, throat or neck symptoms, hearing loss, nasal discharge, sinus congestion and sore throat.  Cardiovascular: Denied cardiovascular symptoms, arrhythmia, chest pain/pressure, edema, exercise intolerance, orthopnea and palpitations.  Respiratory: Denied pulmonary symptoms, asthma, pleuritic pain, productive sputum, cough, dyspnea and wheezing.  Gastrointestinal: Denied, gastro-esophageal reflux, melena, nausea and vomiting.  Genitourinary: See HPI for additional information.  Musculoskeletal: Denied musculoskeletal symptoms, stiffness, swelling, muscle weakness and myalgia.  Dermatologic: Denied dermatology symptoms, rash and scar.  Neurologic: Denied neurology symptoms, dizziness, headache, neck pain and syncope.  Psychiatric: Denied psychiatric symptoms, anxiety and depression.  Endocrine: Denied endocrine symptoms including hot flashes and night sweats.   Meds:   Current Outpatient Medications on File Prior to Visit  Medication Sig Dispense Refill  . acetaminophen (TYLENOL) 325 MG tablet Take 650 mg by mouth every 6 (six) hours as needed for moderate pain.     [name] amitriptyline (ELAVIL) 25  MG tablet  Take 1-2 tablets (25-50 mg total) by mouth at bedtime. 60 tablet 3  . clindamycin (CLEOCIN) 150 MG capsule Take by mouth 3 (three) times daily.    . diazepam (VALIUM) 2 MG tablet 1 tablet 45 minutes before MRI 1 tablet 0  . doxycycline (VIBRAMYCIN) 100 MG capsule Take 100 mg by mouth 2 (two) times daily.    . fluconazole (DIFLUCAN) 200 MG tablet Take 1 tablet (200 mg total) by mouth daily. Take two tablets on first day, then one tablet daily to complete 14 day regimen 15 tablet 1  . FLUoxetine (PROZAC) 40 MG capsule TAKE 1 CAPSULE BY MOUTH EVERY DAY 90 capsule 0  . Hydrocortisone Acetate (VAGISIL) 1 % CREA Apply 1 application topically daily as needed (heat rash).    . hydrOXYzine (ATARAX/VISTARIL) 25 MG tablet Take 1-2 tablets (25-50 mg total) by mouth 3 (three) times daily as needed for anxiety. 30 tablet 1  . nystatin (MYCOSTATIN/NYSTOP) powder Apply 1 application topically 3 (three) times daily. 15 g 0  . valACYclovir (VALTREX) 500 MG tablet Take 1 tablet (500 mg total) by mouth daily. Can increase to twice a day for 5 days in the event of a recurrence 30 tablet 12  . predniSONE (DELTASONE) 20 MG tablet 2 tabs po for 7 days, then 1 tab po for 7 days (Patient not taking: Reported on 01/28/2020) 21 tablet 0  . [DISCONTINUED] FLUoxetine (PROZAC) 40 MG capsule Take 1 capsule (40 mg total) by mouth daily. 90 capsule 0   No current facility-administered medications on file prior to visit.    Objective:     Vitals:   01/28/20 1443  BP: 115/84  Pulse: 90              Physical examination   Pelvic:   Vulva: Normal appearance.  No lesions.  Vagina: No lesions or abnormalities noted.  Support:  Third-degree rectocele second-degree cystocele second-degree uterine prolapse.  Some loss of perineal body.  Urethra No masses tenderness or scarring.  Meatus Normal size without lesions or prolapse.  Cervix: Normal appearance.  No lesions.  Anus: Normal exam.  No lesions.  Perineum: Normal exam.  No  lesions.        Bimanual   Uterus:  10 weeks size smooth.  Non-tender.  Mobile.  AV.  Adnexae: No masses.  Non-tender to palpation.  Cul-de-sac: Negative for abnormality.     Assessment:    No obstetric history on file. Patient Active Problem List   Diagnosis Date Noted  . Genital herpes simplex 01/12/2020  . 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 Moderate episode of recurrent major depressive disorder ([place]) 10/06/2019  . Candidiasis of skin 10/06/2019  . Tongue lesion 08/06/2019  . Tobacco use 08/06/2019  . Hx of cervical cancer 08/06/2019  . Right hip pain 08/06/2019  . Obesity (BMI 30.0-34.9) 08/06/2019  . Essential hypertension 08/06/2019  . Nodule of neck 08/06/2019     1. Pelvic pain in female   2. History of cervical dysplasia   3. Dyspareunia in female     Patient has some prolapse but I am unsure if this could be the cause of her pain.    Plan:            1.  Pelvic ultrasound Orders No orders of the defined types were placed in this encounter.   No orders of the defined types were placed in this encounter.     F/U  Return for We will contact  her with any abnormal test results. I spent 31 minutes involved in the care of this patient preparing to see the patient by obtaining and reviewing her medical history (including labs, imaging tests and prior procedures), documenting clinical information in the electronic health record (EHR), counseling and coordinating care plans, writing and sending prescriptions, ordering tests or procedures and directly communicating with the patient by discussing pertinent items from her history and physical exam as well as detailing my assessment and plan as noted above so that she has an informed understanding.  All of her questions were answered.  Finis Bud, M.D. 01/28/2020 3:08 PM

## 2020-01-29 ENCOUNTER — Ambulatory Visit
Admission: RE | Admit: 2020-01-29 | Discharge: 2020-01-29 | Disposition: A | Discharge status: Home / Self Care | Payer: BC Managed Care – PPO | Source: Ambulatory Visit | Attending: Family Medicine | Admitting: Family Medicine

## 2020-01-29 ENCOUNTER — Other Ambulatory Visit: Payer: Self-pay | Admitting: Family Medicine

## 2020-01-29 ENCOUNTER — Other Ambulatory Visit: Payer: Self-pay

## 2020-01-29 DIAGNOSIS — R200 Anesthesia of skin: Secondary | ICD-10-CM | POA: Diagnosis not present

## 2020-01-29 DIAGNOSIS — M5416 Radiculopathy, lumbar region: Secondary | ICD-10-CM | POA: Diagnosis not present

## 2020-01-29 DIAGNOSIS — F411 Generalized anxiety disorder: Secondary | ICD-10-CM

## 2020-01-29 DIAGNOSIS — F410 Panic disorder [episodic paroxysmal anxiety] without agoraphobia: Secondary | ICD-10-CM

## 2020-01-29 DIAGNOSIS — M48061 Spinal stenosis, lumbar region without neurogenic claudication: Secondary | ICD-10-CM | POA: Diagnosis not present

## 2020-01-29 DIAGNOSIS — F331 Major depressive disorder, recurrent, moderate: Secondary | ICD-10-CM

## 2020-01-30 ENCOUNTER — Encounter: Payer: Self-pay | Admitting: Family Medicine

## 2020-01-30 DIAGNOSIS — M5416 Radiculopathy, lumbar region: Secondary | ICD-10-CM

## 2020-01-30 DIAGNOSIS — R200 Anesthesia of skin: Secondary | ICD-10-CM

## 2020-01-30 DIAGNOSIS — M48061 Spinal stenosis, lumbar region without neurogenic claudication: Secondary | ICD-10-CM

## 2020-02-02 NOTE — Telephone Encounter (Signed)
She also requests an afternoon appointment

## 2020-02-02 NOTE — Addendum Note (Signed)
Addended by: Owens Loffler on: 02/02/2020 04:51 PM   Modules accepted: Orders

## 2020-02-03 ENCOUNTER — Other Ambulatory Visit: Payer: Self-pay | Admitting: Family Medicine

## 2020-02-03 ENCOUNTER — Encounter: Payer: Self-pay | Admitting: Family Medicine

## 2020-02-03 ENCOUNTER — Ambulatory Visit: Payer: BC Managed Care – PPO | Admitting: Family Medicine

## 2020-02-03 ENCOUNTER — Other Ambulatory Visit: Payer: Self-pay | Admitting: Certified Nurse Midwife

## 2020-02-03 ENCOUNTER — Other Ambulatory Visit: Payer: Self-pay

## 2020-02-03 VITALS — BP 110/80 | HR 90 | Temp 98.4°F | Ht 67.0 in | Wt 215.5 lb

## 2020-02-03 DIAGNOSIS — F331 Major depressive disorder, recurrent, moderate: Secondary | ICD-10-CM | POA: Diagnosis not present

## 2020-02-03 DIAGNOSIS — F411 Generalized anxiety disorder: Secondary | ICD-10-CM | POA: Insufficient documentation

## 2020-02-03 MED ORDER — FLUOXETINE HCL 60 MG PO TABS: 60.0000 mg | ORAL_TABLET | Freq: Every day | ORAL | 3 refills | Status: DC

## 2020-02-03 MED ORDER — BUSPIRONE HCL 5 MG PO TABS: 5.0000 mg | ORAL_TABLET | Freq: Two times a day (BID) | ORAL | 2 refills | Status: DC

## 2020-02-03 NOTE — Patient Instructions (Signed)
Anxiety - Send me a mychart with the dose of Fluoxetine you have at home - I sent in 60 mg tablet to the pharmacy and would recommend increasing the dose - Start taking Buspar 5 mg twice daily  Return in 6 weeks to check in  If anxiety is still bad - can increase Buspar dose before our next visit - just send me a MyChart message

## 2020-02-03 NOTE — Progress Notes (Signed)
Subjective:     Erin Dorsey is a 49 y.o. female presenting for Follow-up     HPI  #Depression/anxiety - feels like the depression is helping - endorses continued anxiety - worrying and shaking - endorses some crying -    Review of Systems   Social History   Tobacco Use  Smoking Status Former Smoker  . Packs/day: 0.25  . Years: 20.00  . Pack years: 5.00  . Types: Cigarettes  . Quit date: 09/01/2019  . Years since quitting: 0.4  Smokeless Tobacco Never Used  Tobacco Comment   not daily        Objective:    BP Readings from Last 3 Encounters:  02/03/20 110/80  01/28/20 115/84  01/09/20 120/88   Wt Readings from Last 3 Encounters:  02/03/20 215 lb 8 oz (97.8 kg)  01/28/20 214 lb 4.8 oz (97.2 kg)  01/09/20 207 lb 11.2 oz (94.2 kg)    BP 110/80   Pulse 90   Temp 98.4 F (36.9 C) (Temporal)   Ht 5\' 7"  (1.702 m)   Wt 215 lb 8 oz (97.8 kg)   LMP 06/23/2015   SpO2 96%   BMI 33.75 kg/m    Physical Exam Constitutional:      General: She is not in acute distress.    Appearance: She is well-developed. She is not diaphoretic.  HENT:     Right Ear: External ear normal.     Left Ear: External ear normal.  Eyes:     Conjunctiva/sclera: Conjunctivae normal.  Cardiovascular:     Rate and Rhythm: Normal rate.  Pulmonary:     Effort: Pulmonary effort is normal.  Musculoskeletal:     Cervical back: Neck supple.  Skin:    General: Skin is warm and dry.     Capillary Refill: Capillary refill takes less than 2 seconds.  Neurological:     Mental Status: She is alert. Mental status is at baseline.  Psychiatric:        Mood and Affect: Mood is anxious.        Behavior: Behavior normal.      GAD 7 : Generalized Anxiety Score 02/03/2020 10/06/2019  Nervous, Anxious, on Edge 3 3  Control/stop worrying 3 3  Worry too much - different things 3 3  Trouble relaxing 3 3  Restless 3 3  Easily annoyed or irritable 2 3  Afraid - awful might happen 1 0   Total GAD 7 Score 18 18  Anxiety Difficulty Extremely difficult Very difficult    Depression screen [facility] 2/9 02/03/2020 10/06/2019 08/06/2019  Decreased Interest 3 2 1   Down, Depressed, Hopeless 3 3 0  PHQ - 2 Score 6 5 1   Altered sleeping 3 3 -  Tired, decreased energy 3 3 -  Change in appetite 2 3 -  Feeling bad or failure about yourself  0 2 -  Trouble concentrating 3 3 -  Moving slowly or fidgety/restless 3 2 -  Suicidal thoughts 0 0 -  PHQ-9 Score 20 21 -  Difficult doing work/chores Very difficult Somewhat difficult -        Assessment & Plan:   Problem List Items Addressed This Visit      Other   Moderate episode of recurrent major depressive disorder ([place])    Pt notes improvement in depression, though PHQ-9 unchanged. Will increase fluoxetine and start buspar.       Relevant Medications   FLUoxetine HCl 60 MG TABS  busPIRone (BUSPAR) 5 MG tablet   Generalized anxiety disorder - Primary    Pt notes anxiety on a daily basis which improves with hydroxyzine. Advised starting buspar. If not improving will plan to increase dose in 2-3 weeks. Return in 6 weeks for check in.       Relevant Medications   FLUoxetine HCl 60 MG TABS   busPIRone (BUSPAR) 5 MG tablet       Return in about 6 weeks (around 03/16/2020).  [name], MD  This visit occurred during the SARS-CoV-2 public health emergency.  Safety protocols were in place, including screening questions prior to the visit, additional usage of staff PPE, and extensive cleaning of exam room while observing appropriate contact time as indicated for disinfecting solutions.

## 2020-02-03 NOTE — Telephone Encounter (Signed)
Called [facility] to makes sure they received faxed referral, they have not received anything.  This has been re-faxed.   Faxed to [phone]   [email or web address] Virl Cagey, CMA on 02/03/2020 1102 - delivered at 02/03/2020 1102  They received records and the patient is scheduled for 02/05/20.  Nothing further needed.

## 2020-02-03 NOTE — Assessment & Plan Note (Signed)
Pt notes anxiety on a daily basis which improves with hydroxyzine. Advised starting buspar. If not improving will plan to increase dose in 2-3 weeks. Return in 6 weeks for check in.

## 2020-02-03 NOTE — Assessment & Plan Note (Signed)
Pt notes improvement in depression, though PHQ-9 unchanged. Will increase fluoxetine and start buspar.

## 2020-02-04 ENCOUNTER — Ambulatory Visit (INDEPENDENT_AMBULATORY_CARE_PROVIDER_SITE_OTHER): Payer: BC Managed Care – PPO

## 2020-02-04 DIAGNOSIS — R102 Pelvic and perineal pain: Secondary | ICD-10-CM

## 2020-02-05 ENCOUNTER — Other Ambulatory Visit: Payer: Self-pay | Admitting: Family Medicine

## 2020-02-05 DIAGNOSIS — M7061 Trochanteric bursitis, right hip: Secondary | ICD-10-CM | POA: Diagnosis not present

## 2020-02-05 DIAGNOSIS — M5459 Other low back pain: Secondary | ICD-10-CM | POA: Diagnosis not present

## 2020-02-08 ENCOUNTER — Other Ambulatory Visit: Payer: Self-pay

## 2020-02-11 ENCOUNTER — Other Ambulatory Visit: Payer: Self-pay | Admitting: Family Medicine

## 2020-02-11 DIAGNOSIS — F410 Panic disorder [episodic paroxysmal anxiety] without agoraphobia: Secondary | ICD-10-CM

## 2020-02-11 NOTE — Telephone Encounter (Signed)
Want me to refill yet? She takes 2 a day.

## 2020-02-12 DIAGNOSIS — M7061 Trochanteric bursitis, right hip: Secondary | ICD-10-CM | POA: Diagnosis not present

## 2020-02-16 ENCOUNTER — Encounter: Payer: Self-pay | Admitting: Family Medicine

## 2020-02-16 ENCOUNTER — Other Ambulatory Visit: Payer: Self-pay | Admitting: Family Medicine

## 2020-02-17 ENCOUNTER — Other Ambulatory Visit: Payer: Self-pay

## 2020-02-17 ENCOUNTER — Ambulatory Visit (INDEPENDENT_AMBULATORY_CARE_PROVIDER_SITE_OTHER): Payer: BC Managed Care – PPO | Admitting: Obstetrics and Gynecology

## 2020-02-17 ENCOUNTER — Encounter: Payer: Self-pay | Admitting: Obstetrics and Gynecology

## 2020-02-17 ENCOUNTER — Encounter: Payer: Self-pay | Admitting: Family Medicine

## 2020-02-17 VITALS — BP 134/86 | HR 89 | Ht 67.0 in | Wt 217.1 lb

## 2020-02-17 DIAGNOSIS — D219 Benign neoplasm of connective and other soft tissue, unspecified: Secondary | ICD-10-CM

## 2020-02-17 DIAGNOSIS — N8189 Other female genital prolapse: Secondary | ICD-10-CM | POA: Diagnosis not present

## 2020-02-17 DIAGNOSIS — R102 Pelvic and perineal pain unspecified side: Secondary | ICD-10-CM

## 2020-02-17 DIAGNOSIS — R399 Unspecified symptoms and signs involving the genitourinary system: Secondary | ICD-10-CM

## 2020-02-17 LAB — POCT URINALYSIS DIPSTICK
Bilirubin, UA: NEGATIVE
Blood, UA: NEGATIVE
Glucose, UA: NEGATIVE
Ketones, UA: NEGATIVE
Leukocytes, UA: NEGATIVE
Nitrite, UA: NEGATIVE
Protein, UA: NEGATIVE
Spec Grav, UA: 1.01 (ref 1.010–1.025)
Urobilinogen, UA: 0.2 E.U./dL
pH, UA: 7 (ref 5.0–8.0)

## 2020-02-17 NOTE — Progress Notes (Signed)
Pt present to discuss ultrasound test results. Pt c/o of burning with urination.  UA completed.

## 2020-02-17 NOTE — Progress Notes (Signed)
HPI:      [name] is a 49 y.o. [phone] who LMP was Patient's last menstrual period was 06/23/2015.  Subjective:   She presents today for follow-up after her ultrasound for pelvic pain.  She continues to experience pelvic pressure symptoms daily.  She complains of pulling and feeling like something is falling out of the vagina.  She also complains of daily urine loss. Today she complains of some burning with urination. She is taking her Valtrex as directed.  She has not had a menstrual period in a few years and believes she is in menopause.    Hx: The following portions of the patient's history were reviewed and updated as appropriate:             She  has a past medical history of Allergy, Anxiety, Cervical cancer ([place]) (2011), Chronic headaches, Depression, Frequent falls, and GERD (gastroesophageal reflux disease). She does not have any pertinent problems on file. She  has a past surgical history that includes Foot surgery (Right); Tubal ligation; Colposcopy (2011 or 2012); Parotidectomy (Right, 10/15/2019); and Excision of tongue lesion (N/A, 10/15/2019). Her family history includes Brain cancer in her maternal grandfather; Breast cancer in her paternal aunt; Cervical cancer in her maternal grandmother and sister; Deafness in her daughter; Hypertension in her mother; Kidney disease in her father; Lung cancer in her maternal grandfather; Thyroid disease in her daughter; Uterine cancer in her sister. She  reports that she quit smoking about 5 months ago. Her smoking use included cigarettes. She has a 5.00 pack-year smoking history. She has never used smokeless tobacco. She reports previous drug use. Frequency: 1.00 time per week. Drug: Marijuana. She reports that she does not drink alcohol. She has a current medication list which includes the following prescription(s): amitriptyline, buspirone, clindamycin, cyclobenzaprine, fluoxetine hcl, hydrocortisone acetate, hydroxyzine, nystatin,  valacyclovir, [DISCONTINUED] fluoxetine, and [DISCONTINUED] hydroxyzine. She is allergic to amoxicillin and penicillins.       Review of Systems:  Review of Systems  Constitutional: Denied constitutional symptoms, night sweats, recent illness, fatigue, fever, insomnia and weight loss.  Eyes: Denied eye symptoms, eye pain, photophobia, vision change and visual disturbance.  Ears/Nose/Throat/Neck: Denied ear, nose, throat or neck symptoms, hearing loss, nasal discharge, sinus congestion and sore throat.  Cardiovascular: Denied cardiovascular symptoms, arrhythmia, chest pain/pressure, edema, exercise intolerance, orthopnea and palpitations.  Respiratory: Denied pulmonary symptoms, asthma, pleuritic pain, productive sputum, cough, dyspnea and wheezing.  Gastrointestinal: Denied, gastro-esophageal reflux, melena, nausea and vomiting.  Genitourinary: See HPI for additional information.  Musculoskeletal: Denied musculoskeletal symptoms, stiffness, swelling, muscle weakness and myalgia.  Dermatologic: Denied dermatology symptoms, rash and scar.  Neurologic: Denied neurology symptoms, dizziness, headache, neck pain and syncope.  Psychiatric: Denied psychiatric symptoms, anxiety and depression.  Endocrine: Denied endocrine symptoms including hot flashes and night sweats.   Meds:   Current Outpatient Medications on File Prior to Visit  Medication Sig Dispense Refill  . amitriptyline (ELAVIL) 25 MG tablet TAKE 1-2 TABLETS (25-50 MG TOTAL) BY MOUTH AT BEDTIME. 180 tablet 0  . busPIRone (BUSPAR) 5 MG tablet Take 1 tablet (5 mg total) by mouth 2 (two) times daily. 60 tablet 2  . clindamycin (CLEOCIN) 150 MG capsule Take by mouth 3 (three) times daily.    . cyclobenzaprine (FLEXERIL) 10 MG tablet Take 5-10 mg by mouth at bedtime.    [name] FLUoxetine HCl 60 MG TABS Take 60 mg by mouth daily. 90 tablet 3  . Hydrocortisone Acetate (VAGISIL) 1 % CREA Apply  1 application topically daily as needed (heat rash).     . hydrOXYzine (ATARAX/VISTARIL) 25 MG tablet Take 1 tablet (25 mg total) by mouth every 8 (eight) hours as needed. 60 tablet 3  . nystatin (MYCOSTATIN/NYSTOP) powder Apply 1 application topically 3 (three) times daily. 15 g 0  . valACYclovir (VALTREX) 500 MG tablet Take 1 tablet (500 mg total) by mouth daily. Can increase to twice a day for 5 days in the event of a recurrence 30 tablet 12  . [DISCONTINUED] FLUoxetine (PROZAC) 40 MG capsule Take 1 capsule (40 mg total) by mouth daily. 90 capsule 0  . [DISCONTINUED] hydrOXYzine (ATARAX/VISTARIL) 25 MG tablet TAKE 1 TO 2 TABLETS BY MOUTH 3 TIMES A DAY AS NEEDED FOR ANXIETY 30 tablet 0   No current facility-administered medications on file prior to visit.          Objective:     Vitals:   02/17/20 1514  BP: 134/86  Pulse: 89   Filed Weights   02/17/20 1514  Weight: 217 lb 1.6 oz (98.5 kg)              Ultrasound results reviewed directly with the patient that showed multiple small uterine fibroids  Ovaries appeared normal  Assessment:    [id number] Patient Active Problem List   Diagnosis Date Noted  . Generalized anxiety disorder 02/03/2020  . Genital herpes simplex 01/12/2020  . 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 Moderate episode of recurrent major depressive disorder ([place]) 10/06/2019  . Candidiasis of skin 10/06/2019  . Tongue lesion 08/06/2019  . Tobacco use 08/06/2019  . Hx of cervical cancer 08/06/2019  . Right hip pain 08/06/2019  . Obesity (BMI 30.0-34.9) 08/06/2019  . Essential hypertension 08/06/2019  . Nodule of neck 08/06/2019     1. UTI symptoms   2. Pelvic pain in female   3. Pelvic relaxation disorder   4. 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  Some loss of perineal body is present.  She also has stress urinary incontinence.  No evidence of UTI today.  Multiple small uterine fibroids present-patient likely asymptomatic from these in menopause.   Plan:             1.  Discussed multiple options with the patient and she continues to believe that surgery is her best option.  She understands this is not a necessity.  It is however likely that her pelvic pressure and feeling of something falling out symptoms are from her pelvic relaxation. Orders Orders Placed This Encounter  Procedures  . POCT urinalysis dipstick    No orders of the defined types were placed in this encounter.     F/U  Return in about 3 weeks (around 03/09/2020). I spent 23 minutes involved in the care of this patient preparing to see the patient by obtaining and reviewing her medical history (including labs, imaging tests and prior procedures), documenting clinical information in the electronic health record (EHR), counseling and coordinating care plans, writing and sending prescriptions, ordering tests or procedures and directly communicating with the patient by discussing pertinent items from her history and physical exam as well as detailing my assessment and plan as noted above so that she has an informed understanding.  All of her questions were answered.  Finis Bud, M.D. 02/17/2020 3:47 PM

## 2020-02-22 ENCOUNTER — Other Ambulatory Visit: Payer: Self-pay | Admitting: Certified Nurse Midwife

## 2020-02-26 ENCOUNTER — Other Ambulatory Visit: Payer: Self-pay | Admitting: Family Medicine

## 2020-02-26 ENCOUNTER — Other Ambulatory Visit: Payer: Self-pay | Admitting: Certified Nurse Midwife

## 2020-02-26 DIAGNOSIS — F411 Generalized anxiety disorder: Secondary | ICD-10-CM

## 2020-02-26 DIAGNOSIS — F331 Major depressive disorder, recurrent, moderate: Secondary | ICD-10-CM

## 2020-02-26 NOTE — Telephone Encounter (Signed)
Patient may have two (2) diflucan. If the symptoms persist, then she will need to schedule an appt. Thanks, JML

## 2020-02-27 ENCOUNTER — Other Ambulatory Visit: Payer: Self-pay

## 2020-02-27 MED ORDER — FLUCONAZOLE 150 MG PO TABS: ORAL_TABLET | ORAL | 1 refills | Status: DC

## 2020-02-27 NOTE — Telephone Encounter (Signed)
Per Dani Gobble, CNM script for Diflucan sent to pharmacy on file.

## 2020-03-02 DIAGNOSIS — M545 Low back pain, unspecified: Secondary | ICD-10-CM | POA: Diagnosis not present

## 2020-03-02 DIAGNOSIS — M5416 Radiculopathy, lumbar region: Secondary | ICD-10-CM | POA: Diagnosis not present

## 2020-03-04 ENCOUNTER — Encounter: Payer: Self-pay | Admitting: Family Medicine

## 2020-03-04 ENCOUNTER — Other Ambulatory Visit: Payer: Self-pay | Admitting: Family Medicine

## 2020-03-05 ENCOUNTER — Other Ambulatory Visit: Payer: Self-pay

## 2020-03-05 ENCOUNTER — Ambulatory Visit (INDEPENDENT_AMBULATORY_CARE_PROVIDER_SITE_OTHER): Payer: BC Managed Care – PPO | Admitting: Obstetrics and Gynecology

## 2020-03-05 ENCOUNTER — Encounter: Payer: Self-pay | Admitting: Obstetrics and Gynecology

## 2020-03-05 VITALS — BP 121/85 | HR 81 | Ht 67.0 in | Wt 217.2 lb

## 2020-03-05 DIAGNOSIS — R102 Pelvic and perineal pain: Secondary | ICD-10-CM

## 2020-03-05 DIAGNOSIS — N393 Stress incontinence (female) (male): Secondary | ICD-10-CM

## 2020-03-05 DIAGNOSIS — Z01818 Encounter for other preprocedural examination: Secondary | ICD-10-CM

## 2020-03-05 DIAGNOSIS — D219 Benign neoplasm of connective and other soft tissue, unspecified: Secondary | ICD-10-CM | POA: Diagnosis not present

## 2020-03-05 DIAGNOSIS — N8189 Other female genital prolapse: Secondary | ICD-10-CM | POA: Diagnosis not present

## 2020-03-05 MED ORDER — METRONIDAZOLE 500 MG PO TABS: 500.0000 mg | ORAL_TABLET | Freq: Two times a day (BID) | ORAL | 0 refills | Status: DC

## 2020-03-05 NOTE — H&P (Signed)
PRE-OPERATIVE HISTORY AND PHYSICAL EXAM  PCP:  [name], MD Subjective:   HPI:  Erin Dorsey is a 49 y.o. [phone].  Patient's last menstrual period was 06/23/2015.  She presents today for a pre-op discussion and PE.  She continues to experience pelvic pressure symptoms daily with pelvic pain 2-3 times per week.  She complains of pulling and feeling like something is falling out of the vagina.  She reports her pain to be worse with intercourse. She describes no problems with bowel movements. She also complains of daily urine loss. She has urine loss with coughing laughing and sneezing. She states that she has not had a period in more than 2 years and believes that she is in menopause. She describes 3 uneventful vaginal births with all babies weighing under 8 pounds.   Review of Systems:   Constitutional: Denied constitutional symptoms, night sweats, recent illness, fatigue, fever, insomnia and weight loss.  Eyes: Denied eye symptoms, eye pain, photophobia, vision change and visual disturbance.  Ears/Nose/Throat/Neck: Denied ear, nose, throat or neck symptoms, hearing loss, nasal discharge, sinus congestion and sore throat.  Cardiovascular: Denied cardiovascular symptoms, arrhythmia, chest pain/pressure, edema, exercise intolerance, orthopnea and palpitations.  Respiratory: Denied pulmonary symptoms, asthma, pleuritic pain, productive sputum, cough, dyspnea and wheezing.  Gastrointestinal: Denied, gastro-esophageal reflux, melena, nausea and vomiting.  Genitourinary: See HPI for additional information.  Musculoskeletal: Denied musculoskeletal symptoms, stiffness, swelling, muscle weakness and myalgia.  Dermatologic: Denied dermatology symptoms, rash and scar.  Neurologic: Denied neurology symptoms, dizziness, headache, neck pain and syncope.  Psychiatric: Denied psychiatric symptoms, anxiety and depression.  Endocrine: Denied endocrine symptoms including hot flashes and  night sweats.   OB History  Gravida Para Term Preterm AB Living  3 3 3     3   SAB TAB Ectopic Multiple Live Births          3    # Outcome Date GA Lbr Len/2nd Weight Sex Delivery Anes PTL Lv  3 Term 06/15/00    F Vag-Spont   LIV  2 Term 11/25/92    F Vag-Spont   LIV  1 Term 12/12/90    F Vag-Spont   LIV    Past Medical History:  Diagnosis Date  . Allergy   . Anxiety   . Cervical cancer ([place]) 2011   stage 3  . Chronic headaches   . Depression   . Frequent falls   . GERD (gastroesophageal reflux disease)     Past Surgical History:  Procedure Laterality Date  . COLPOSCOPY  2011 or 2012   stage 3-removed cancerous spot on the cervix  . EXCISION OF TONGUE LESION N/A 10/15/2019   Procedure: EXCISION OF TONGUE LESION;  Surgeon: Clyde Canterbury, MD;  Location: [facility] ORS;  Service: ENT;  Laterality: N/A;  . FOOT SURGERY Right    x 2-took knots out from bottom of the foot-not sure of the technical name  . PAROTIDECTOMY Right 10/15/2019   Procedure: PAROTIDECTOMY;  Surgeon: Clyde Canterbury, MD;  Location: [facility];  Service: ENT;  Laterality: Right;  . TUBAL LIGATION        SOCIAL HISTORY: Social History   Tobacco Use  Smoking Status Former Smoker  . Packs/day: 0.25  . Years: 20.00  . Pack years: 5.00  . Types: Cigarettes  . Quit date: 09/01/2019  . 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 Frequency: 1.0 times per week  . Types: Marijuana    Family History  Problem Relation Age of Onset  . Hypertension Mother   . Kidney disease Father        was on dialysis  . Uterine cancer Sister   . Cervical cancer Sister   . Deafness Daughter   . Cervical cancer Maternal Grandmother   . Lung cancer Maternal Grandfather   . Brain cancer Maternal Grandfather   .  Thyroid disease Daughter   . Breast cancer Paternal Aunt     ALLERGIES:  Amoxicillin and Penicillins  MEDS:   Current Outpatient Medications on File Prior to Visit  Medication Sig Dispense Refill  . amitriptyline (ELAVIL) 25 MG tablet TAKE 1-2 TABLETS (25-50 MG TOTAL) BY MOUTH AT BEDTIME. 180 tablet 0  . busPIRone (BUSPAR) 5 MG tablet Take 1 tablet (5 mg total) by mouth 2 (two) times daily. 60 tablet 2  . clindamycin (CLEOCIN) 150 MG capsule Take by mouth 3 (three) times daily.    . cyclobenzaprine (FLEXERIL) 10 MG tablet Take 5-10 mg by mouth at bedtime.    . fluconazole (DIFLUCAN) 150 MG tablet Take 1 tablet by mouth. If symptoms continue after 3 days, you may take the 2nd tablet. 1 tablet 1  . FLUoxetine HCl 60 MG TABS Take 60 mg by mouth daily. 90 tablet 3  . Hydrocortisone Acetate (VAGISIL) 1 % CREA Apply 1 application topically daily as needed (heat rash).    . hydrOXYzine (ATARAX/VISTARIL) 25 MG tablet Take 1 tablet (25 mg total) by mouth every 8 (eight) hours as needed. 60 tablet 3  . nystatin (MYCOSTATIN/NYSTOP) powder APPLY TOPICALLY 3 (THREE) TIMES DAILY. 15 g 0  . valACYclovir (VALTREX) 500 MG tablet Take 1 tablet (500 mg total) by mouth daily. Can increase to twice a day for 5 days in the event of a recurrence 30 tablet 12  . [DISCONTINUED] FLUoxetine (PROZAC) 40 MG capsule Take 1 capsule (40 mg total) by mouth daily. 90 capsule 0  . [DISCONTINUED] hydrOXYzine (ATARAX/VISTARIL) 25 MG tablet TAKE 1 TO 2 TABLETS BY MOUTH 3 TIMES A DAY AS NEEDED FOR ANXIETY 30 tablet 0   No current facility-administered medications on file prior to visit.    Meds ordered this encounter  Medications  . metroNIDAZOLE (FLAGYL) 500 MG tablet    Sig: Take 1 tablet (500 mg total) by mouth 2 (two) times daily. Begin 5 days prior to scheduled surgery as directed.    Dispense:  10 tablet    Refill:  0    BP 121/85   Pulse 81   Ht 5\' 7"  (1.702 m)   Wt 217 lb 3.2 oz (98.5 kg)   LMP 06/23/2015    BMI 34.02 kg/m   Physical examination General NAD, Conversant  HEENT Atraumatic; Op clear with mmm.  Normo-cephalic. Pupils reactive. Anicteric sclerae  Thyroid/Neck Smooth without nodularity or enlargement. Normal ROM.  Neck Supple.  Skin No rashes, lesions or ulceration. Normal palpated skin turgor. No nodularity.  Breasts: No masses or discharge.  Symmetric.  No axillary adenopathy.  Lungs: Clear to auscultation.No rales or wheezes. Normal Respiratory effort, no retractions.  Heart: NSR.  No murmurs or rubs appreciated. No periferal edema  Abdomen: Soft.  Non-tender.  No masses.  No HSM. No hernia  Extremities: Moves all  appropriately.  Normal ROM for age. No lymphadenopathy.  Neuro: Oriented to PPT.  Normal mood. Normal affect.    Pelvic:   Vulva: Normal appearance.  No lesions.   Vagina: No lesions or abnormalities noted.   Support:  Third-degree rectocele second-degree cystocele second-degree uterine prolapse.  Some loss of perineal body.   Urethra No masses tenderness or scarring.   Meatus Normal size without lesions or prolapse.   Cervix: Normal appearance.  No lesions.   Anus: Normal exam.  No lesions.   Perineum: Normal exam.  No lesions.         Bimanual   Uterus:  10 weeks size smooth.  Non-tender.  Mobile.  AV.    Adnexae: No masses.  Non-tender to palpation.    Cul-de-sac: Negative for abnormality.       Assessment:   [id number] Patient Active Problem List   Diagnosis Date Noted  . Generalized anxiety disorder 02/03/2020  . Genital herpes simplex 01/12/2020  . 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 Moderate episode of recurrent major depressive disorder ([place]) 10/06/2019  . Candidiasis of skin 10/06/2019  . Tongue lesion 08/06/2019  . Tobacco use 08/06/2019  . Hx of cervical cancer 08/06/2019  . Right hip pain 08/06/2019  . Obesity (BMI 30.0-34.9) 08/06/2019  . Essential hypertension 08/06/2019  . Nodule of neck 08/06/2019    1. Preop examination   2. Pelvic pain in  female   3. Pelvic relaxation disorder   4. Fibroids   5. SUI (stress urinary incontinence, female)      Plan:    Orders: Meds ordered this encounter  Medications  . metroNIDAZOLE (FLAGYL) 500 MG tablet    Sig: Take 1 tablet (500 mg total) by mouth 2 (two) times daily. Begin 5 days prior to scheduled surgery as directed.    Dispense:  10 tablet    Refill:  0     1.  LAVH BSO A&P TOT

## 2020-03-05 NOTE — Progress Notes (Addendum)
PRE-OPERATIVE HISTORY AND PHYSICAL EXAM  PCP:  [name], MD Subjective:   HPI:  Erin Dorsey is a 49 y.o. [phone].  Patient's last menstrual period was 06/23/2015.  She presents today for a pre-op discussion and PE.  She continues to experience pelvic pressure symptoms daily with pelvic pain 2-3 times per week.  She complains of pulling and feeling like something is falling out of the vagina.  She reports her pain to be worse with intercourse. She describes no problems with bowel movements. She also complains of daily urine loss. She has urine loss with coughing laughing and sneezing. She states that she has not had a period in more than 2 years and believes that she is in menopause. She describes 3 uneventful vaginal births with all babies weighing under 8 pounds.   Review of Systems:   Constitutional: Denied constitutional symptoms, night sweats, recent illness, fatigue, fever, insomnia and weight loss.  Eyes: Denied eye symptoms, eye pain, photophobia, vision change and visual disturbance.  Ears/Nose/Throat/Neck: Denied ear, nose, throat or neck symptoms, hearing loss, nasal discharge, sinus congestion and sore throat.  Cardiovascular: Denied cardiovascular symptoms, arrhythmia, chest pain/pressure, edema, exercise intolerance, orthopnea and palpitations.  Respiratory: Denied pulmonary symptoms, asthma, pleuritic pain, productive sputum, cough, dyspnea and wheezing.  Gastrointestinal: Denied, gastro-esophageal reflux, melena, nausea and vomiting.  Genitourinary: See HPI for additional information.  Musculoskeletal: Denied musculoskeletal symptoms, stiffness, swelling, muscle weakness and myalgia.  Dermatologic: Denied dermatology symptoms, rash and scar.  Neurologic: Denied neurology symptoms, dizziness, headache, neck pain and syncope.  Psychiatric: Denied psychiatric symptoms, anxiety and depression.  Endocrine: Denied endocrine symptoms including hot flashes and  night sweats.   OB History  Gravida Para Term Preterm AB Living  3 3 3     3   SAB TAB Ectopic Multiple Live Births          3    # Outcome Date GA Lbr Len/2nd Weight Sex Delivery Anes PTL Lv  3 Term 06/15/00    F Vag-Spont   LIV  2 Term 11/25/92    F Vag-Spont   LIV  1 Term 12/12/90    F Vag-Spont   LIV    Past Medical History:  Diagnosis Date  . Allergy   . Anxiety   . Cervical cancer ([place]) 2011   stage 3  . Chronic headaches   . Depression   . Frequent falls   . GERD (gastroesophageal reflux disease)     Past Surgical History:  Procedure Laterality Date  . COLPOSCOPY  2011 or 2012   stage 3-removed cancerous spot on the cervix  . EXCISION OF TONGUE LESION N/A 10/15/2019   Procedure: EXCISION OF TONGUE LESION;  Surgeon: Clyde Canterbury, MD;  Location: [facility];  Service: ENT;  Laterality: N/A;  . FOOT SURGERY Right    x 2-took knots out from bottom of the foot-not sure of the technical name  . PAROTIDECTOMY Right 10/15/2019   Procedure: PAROTIDECTOMY;  Surgeon: Clyde Canterbury, MD;  Location: [facility];  Service: ENT;  Laterality: Right;  . TUBAL LIGATION        SOCIAL HISTORY: Social History   Tobacco Use  Smoking Status Former Smoker  . Packs/day: 0.25  . Years: 20.00  . Pack years: 5.00  . Types: Cigarettes  . Quit date: 09/01/2019  . 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 Frequency: 1.0 times per week  . Types: Marijuana    Family History  Problem Relation Age of Onset  . Hypertension Mother   . Kidney disease Father        was on dialysis  . Uterine cancer Sister   . Cervical cancer Sister   . Deafness Daughter   . Cervical cancer Maternal Grandmother   . Lung cancer Maternal Grandfather   . Brain cancer Maternal Grandfather   .  Thyroid disease Daughter   . Breast cancer Paternal Aunt     ALLERGIES:  Amoxicillin and Penicillins  MEDS:   Current Outpatient Medications on File Prior to Visit  Medication Sig Dispense Refill  . amitriptyline (ELAVIL) 25 MG tablet TAKE 1-2 TABLETS (25-50 MG TOTAL) BY MOUTH AT BEDTIME. 180 tablet 0  . busPIRone (BUSPAR) 5 MG tablet Take 1 tablet (5 mg total) by mouth 2 (two) times daily. 60 tablet 2  . clindamycin (CLEOCIN) 150 MG capsule Take by mouth 3 (three) times daily.    . cyclobenzaprine (FLEXERIL) 10 MG tablet Take 5-10 mg by mouth at bedtime.    . fluconazole (DIFLUCAN) 150 MG tablet Take 1 tablet by mouth. If symptoms continue after 3 days, you may take the 2nd tablet. 1 tablet 1  . FLUoxetine HCl 60 MG TABS Take 60 mg by mouth daily. 90 tablet 3  . Hydrocortisone Acetate (VAGISIL) 1 % CREA Apply 1 application topically daily as needed (heat rash).    . hydrOXYzine (ATARAX/VISTARIL) 25 MG tablet Take 1 tablet (25 mg total) by mouth every 8 (eight) hours as needed. 60 tablet 3  . nystatin (MYCOSTATIN/NYSTOP) powder APPLY TOPICALLY 3 (THREE) TIMES DAILY. 15 g 0  . valACYclovir (VALTREX) 500 MG tablet Take 1 tablet (500 mg total) by mouth daily. Can increase to twice a day for 5 days in the event of a recurrence 30 tablet 12  . [DISCONTINUED] FLUoxetine (PROZAC) 40 MG capsule Take 1 capsule (40 mg total) by mouth daily. 90 capsule 0  . [DISCONTINUED] hydrOXYzine (ATARAX/VISTARIL) 25 MG tablet TAKE 1 TO 2 TABLETS BY MOUTH 3 TIMES A DAY AS NEEDED FOR ANXIETY 30 tablet 0   No current facility-administered medications on file prior to visit.    Meds ordered this encounter  Medications  . metroNIDAZOLE (FLAGYL) 500 MG tablet    Sig: Take 1 tablet (500 mg total) by mouth 2 (two) times daily. Begin 5 days prior to scheduled surgery as directed.    Dispense:  10 tablet    Refill:  0    BP 121/85   Pulse 81   Ht 5\' 7"  (1.702 m)   Wt 217 lb 3.2 oz (98.5 kg)   LMP 06/23/2015    BMI 34.02 kg/m   Physical examination General NAD, Conversant  HEENT Atraumatic; Op clear with mmm.  Normo-cephalic. Pupils reactive. Anicteric sclerae  Thyroid/Neck Smooth without nodularity or enlargement. Normal ROM.  Neck Supple.  Skin No rashes, lesions or ulceration. Normal palpated skin turgor. No nodularity.  Breasts: No masses or discharge.  Symmetric.  No axillary adenopathy.  Lungs: Clear to auscultation.No rales or wheezes. Normal Respiratory effort, no retractions.  Heart: NSR.  No murmurs or rubs appreciated. No periferal edema  Abdomen: Soft.  Non-tender.  No masses.  No HSM. No hernia  Extremities: Moves all  appropriately.  Normal ROM for age. No lymphadenopathy.  Neuro: Oriented to PPT.  Normal mood. Normal affect.    Pelvic:   Vulva: Normal appearance.  No lesions.   Vagina: No lesions or abnormalities noted.   Support:  Third-degree rectocele second-degree cystocele second-degree uterine prolapse.  Some loss of perineal body.   Urethra No masses tenderness or scarring.   Meatus Normal size without lesions or prolapse.   Cervix: Normal appearance.  No lesions.   Anus: Normal exam.  No lesions.   Perineum: Normal exam.  No lesions.         Bimanual   Uterus:  10 weeks size smooth.  Non-tender.  Mobile.  AV.    Adnexae: No masses.  Non-tender to palpation.    Cul-de-sac: Negative for abnormality.       Assessment:   [id number] Patient Active Problem List   Diagnosis Date Noted  . Generalized anxiety disorder 02/03/2020  . Genital herpes simplex 01/12/2020  . 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 Moderate episode of recurrent major depressive disorder ([place]) 10/06/2019  . Candidiasis of skin 10/06/2019  . Tongue lesion 08/06/2019  . Tobacco use 08/06/2019  . Hx of cervical cancer 08/06/2019  . Right hip pain 08/06/2019  . Obesity (BMI 30.0-34.9) 08/06/2019  . Essential hypertension 08/06/2019  . Nodule of neck 08/06/2019    1. Preop examination   2. Pelvic pain in  female   3. Pelvic relaxation disorder   4. Fibroids   5. SUI (stress urinary incontinence, female)      Plan:    Orders: Meds ordered this encounter  Medications  . metroNIDAZOLE (FLAGYL) 500 MG tablet    Sig: Take 1 tablet (500 mg total) by mouth 2 (two) times daily. Begin 5 days prior to scheduled surgery as directed.    Dispense:  10 tablet    Refill:  0     1.  LAVH BSO A&P TOT  Pre-op discussions regarding Risks and Benefits of her scheduled surgery.  LAVH The procedure of Laparoscopic Assisted Vaginal Hysterectomy was described to the patient in detail.  We reviewed the rationale for Hysterectomy and the patient was again informed of other nonsurgical management possibilities for her condition.  She has considered these other options, and desires a Hysterectomy.  We have reviewed the fact that Hysterectomy is permanent and that following the procedure she will not be able to become pregnant or bear children.  We have discussed the following risk factors specifically and the patient has also been informed that additional complications not mentioned may develop:  Damage to bowel, bladder, ureters or to other internal organs, bleeding, infection and the risk from anesthesia.  We have discussed the procedure itself in detail and she has an informed understanding of this surgery.  We have also discussed the recovery period in which physical and sexual activity will be restricted for a varying degree of time, often 3 - 6 weeks. The Laparoscopic Portion of Hysterectomy has also been reviewed with the patient.  She understands how the laparoscope facilitates the procedure.  We have discussed the abdominal incisions and punctures that will be used.  We have also reviewed the increased Operating Room time often accompanying LAVH.  The slightly increased risk of complications secondary to abdominal punctures, and use of laparoscopic instrumentation has also been discussed in detail.I have answered  all of her questions and I believe the patient has an informed understanding of the procedure of Laparoscopic Assisted Vaginal Hysterectomy. Oophorectomy The option  of Oophorectomy has been discussed with the patient.  Detailed risk/benefits have been reviewed.  The risks discussed include, but are not limited to, hemorrhage, infection, damage to ureter or other internal organ, and Ovarian Remnant Syndrome.  The benefits include a significant decrease in the risk of Ovarian Cancer and in benign Ovarian disease.  The risk of Ovarian CA has been estimated at 1 in 36.  This is a relatively small risk.  However, should Ovarian CA develop, it is often found late in the course of the disease.  We have also discussed the role of inheritance in the development of Ovarian disease.  Some women, who have close relatives with Ovarian CA, have a higher than 1 in 70 risk of Ovarian CA.  The benefits of Estrogen replacement therapy following Oophorectomy has been stressed.  If she is premenopausal, we have discussed the fact that this procedure will make her permanently sterile and that premature menopause will result if no ERT is begun.  I have answered all of her questions, and I believe that she has an adequate and informed understanding of the risks and benefits of Oophorectomy. Anterior Repair I have discussed the procedure of anterior repair and Kelly placation.  I have informed the patient that this procedure often corrects or improves stress urinary incontinence, but that there is certainly no guarantee of her improvement.  The procedure itself was discussed.  The possible damage to the bowel, ureters or urethra was also discussed.  We have reviewed the repositioning of the bladder that often takes place at anterior repair and I have informed her that although unlikely, it is possible that a worsening of her incontinence could occur after this procedure.  I have also discussed with her the necessity of decreased  lifting and physical activity following the procedure as well as the possibility that as she gets older, her stress urinary incontinence could slowly return.  The use of vaginal Estrogen or oral Estrogen as well as other medications in the role of both stress and bladder dysynergia incontinence were discussed.  I have discussed the complication of inability to void immediately following the procedure.  The patient is aware that she may go home using a Foley catheter or may be taught the technique of self-catheterization should a complication develop.  All of her questions have been answered and I believe that she has an informed understanding of anterior repair/Kelly plication. Posterior Repair Posterior repair was discussed with the patient.  The risks were reviewed and include:  possible damage to rectum and bowel, bleeding, infection and anesthesia.  The benefits were also discussed.  Post-op recovery with special attention to hospital stay and return to sexual function were specifically reviewed.  All her questions were answered, and I believe that she has an informed understanding of Posterior repair.  TOT I have discussed the procedure of tension free vaginal tape using the trans-obturator approach. (TOT).  I have informed the patient that this procedure often corrects or improves stress urinary incontinence.  For patients without urinary incontinence-this procedure is often performed to lower the risk of iatragenic urinary incontinence at the time of cystocele repair.The patient has been made aware that there is no guarantee of her improvement or the length of time her improvement will last.  The procedure itself was discussed in detail including possible damage to bowel, ureters, urethra and bladder.  I have informed her that the mesh is permanent.  The risk of extrusion of the mesh has also been reviewed.  The management of this complication has been discussed.  The risks of bleeding and infection were  also reviewed.  I have specifically discussed the complication of inability to void following the procedure and the patient is aware that she may go home using a Foley catheter or using a self-catheterization technique.  In addition, I have discussed with the patient the use of cystoscopy to diagnose bladder injury should there be any question of this.    Regarding the polypropylene mesh and mid-urethral slings: I have review the current position statements from [employer] and the [employer]. All of her questions were answered.  She has been advised that should she have any additional questions or concerns regarding her sling procedure we would be happy to schedule a time before her surgery to discuss them. All of the patient's questions have been answered and I believe she has an informed understanding of TOT and cystoscopy.  I spent 35 minutes involved in the care of this patient preparing to see the patient by obtaining and reviewing her medical history (including labs, imaging tests and prior procedures), documenting clinical information in the electronic health record (EHR), counseling and coordinating care plans, writing and sending prescriptions, ordering tests or procedures and directly communicating with the patient by discussing pertinent items from her history and physical exam as well as detailing my assessment and plan as noted above so that she has an informed understanding.  All of her questions were answered.  Finis Bud, M.D. 03/05/2020 10:51 AM

## 2020-03-15 DIAGNOSIS — M5416 Radiculopathy, lumbar region: Secondary | ICD-10-CM | POA: Diagnosis not present

## 2020-03-18 ENCOUNTER — Ambulatory Visit: Payer: BC Managed Care – PPO | Admitting: Family Medicine

## 2020-03-23 ENCOUNTER — Other Ambulatory Visit: Payer: Self-pay | Admitting: Family Medicine

## 2020-03-23 DIAGNOSIS — F331 Major depressive disorder, recurrent, moderate: Secondary | ICD-10-CM

## 2020-03-23 NOTE — Telephone Encounter (Signed)
Last office visit 02/03/2020 for GAD.  Last refilled 01/29/2020 for #[id number] with no refills by Dr. Lorelei Pont.  Next Appt: 04/19/2020 for follow up.

## 2020-03-24 ENCOUNTER — Encounter: Payer: Self-pay | Admitting: Obstetrics and Gynecology

## 2020-03-24 NOTE — Progress Notes (Signed)
Erin: Erin Dorsey is a 49 year old menopausal woman who has been experiencing daily pelvic pressure symptoms with significantly worse pelvic pain two to three times per week. Her pressure symptoms are described as "a pulling feeling like something is falling out of the vagina". Additional issues include dyspareunia with each act of intercourse and daily urine loss exacerbated by coughing laughing and sneezing.  Her physical exam is significant for a third-degree midline rectocele, a second-degree midline cystocele and second-degree uterine prolapse. A significant loss of the perineal body is also present. A recent ultrasound shows multiple small uterine fibroids.  Erin Dorsey was scheduled for an LAVH BSO with anterior and posterior repair and trans-obturator tension free vaginal tape procedure. Insurance has denied an overnight stay for this patient. Because of the extensive pelvic repair that will be required with reconstruction of the perineal body and vagina, and the procedure of tension free vaginal tape placement, she will likely experience urinary retention. It has been my experience that patients requiring this degree of pelvic reconstruction often have urinary retention for a few days. We have offered her the option of using a Foley catheter at home for one week or learning to self-catheterize after the surgery to try to speed up her discharge from the hospital. She, like many patients, are loathe to have the indwelling Foley catheter or have the requirement of self-catheterization over several days. In addition to the discomfort the increased risk of urinary tract infection also comes into play.  I am asking you to reconsider your decision regarding no overnight stay for Erin Dorsey. I believe her outcome as well as her general satisfaction and recovery will benefit by a short inpatient stay.  Thank you for your attention in this matter.  Finis Bud, MD

## 2020-04-05 ENCOUNTER — Other Ambulatory Visit: Admission: RE | Admit: 2020-04-05 | Payer: BC Managed Care – PPO | Source: Ambulatory Visit

## 2020-04-06 ENCOUNTER — Encounter: Payer: Self-pay | Admitting: Family Medicine

## 2020-04-06 DIAGNOSIS — M25551 Pain in right hip: Secondary | ICD-10-CM | POA: Diagnosis not present

## 2020-04-06 DIAGNOSIS — M545 Low back pain, unspecified: Secondary | ICD-10-CM | POA: Diagnosis not present

## 2020-04-08 ENCOUNTER — Encounter
Admission: RE | Admit: 2020-04-08 | Discharge: 2020-04-08 | Disposition: A | Discharge status: Home / Self Care | Payer: BC Managed Care – PPO | Source: Ambulatory Visit | Attending: Obstetrics and Gynecology | Admitting: Obstetrics and Gynecology

## 2020-04-08 ENCOUNTER — Other Ambulatory Visit: Payer: BC Managed Care – PPO

## 2020-04-08 ENCOUNTER — Other Ambulatory Visit: Payer: Self-pay

## 2020-04-08 ENCOUNTER — Encounter: Payer: Self-pay | Admitting: Family Medicine

## 2020-04-08 HISTORY — DX: Radiculopathy, lumbar region: M54.16

## 2020-04-08 NOTE — Patient Instructions (Addendum)
Your procedure is scheduled on: Monday, December 13 Report to the Registration Desk on the 1st floor of the [facility]. To find out your arrival time, please call [phone] between 1PM - 3PM on: Friday, December 10  REMEMBER: Instructions that are not followed completely may result in serious medical risk, up to and including death; or upon the discretion of your surgeon and anesthesiologist your surgery may need to be rescheduled.  Do not eat food after midnight the night before surgery.  No gum chewing, lozengers or hard candies.  You may however, drink CLEAR liquids up to 2 hours before you are scheduled to arrive for your surgery. Do not drink anything within 2 hours of your scheduled arrival time.  Clear liquids include: - water  - apple juice without pulp - gatorade (not RED, PURPLE, OR BLUE) - black coffee or tea (Do NOT add milk or creamers to the coffee or tea) Do NOT drink anything that is not on this list.  In addition, your doctor has ordered for you to drink the provided  Ensure Pre-Surgery Clear Carbohydrate Drink  Drinking this carbohydrate drink up to two hours before surgery helps to reduce insulin resistance and improve patient outcomes. Please complete drinking 2 hours prior to scheduled arrival time.  TAKE THESE MEDICATIONS THE MORNING OF SURGERY WITH A SIP OF WATER:  1.  Buspirone (Buspar) 2.  Fluoxetine (Prozac) 3.  Gabapentin 4.  Metronidazole (Flagyl)  One week prior to surgery: Stop aspirin, Anti-inflammatories (NSAIDS) such as Diclofenac, Advil, Aleve, Ibuprofen, Motrin, Naproxen, Naprosyn and Aspirin based products such as Excedrin, Goodys Powder, BC Powder. Stop ANY OVER THE COUNTER supplements until after surgery. (However, you may continue taking multivitamin up until the day before surgery.)  No Alcohol for 24 hours before or after surgery.  No Smoking including e-cigarettes for 24 hours prior to surgery.  No chewable tobacco products for at  least 6 hours prior to surgery.  No nicotine patches on the day of surgery.  Do not use any "recreational" drugs for at least a week prior to your surgery.  Please be advised that the combination of cocaine and anesthesia may have negative outcomes, up to and including death. If you test positive for cocaine, your surgery will be cancelled.  On the morning of surgery brush your teeth with toothpaste and water, you may rinse your mouth with mouthwash if you wish. Do not swallow any toothpaste or mouthwash.  Do not wear jewelry, make-up, hairpins, clips or nail polish.  Do not wear lotions, powders, or perfumes.   Do not shave body from the neck down 48 hours prior to surgery just in case you cut yourself which could leave a site for infection.  Also, freshly shaved skin may become irritated if using the CHG soap.  Do not bring valuables to the hospital. [facility] is not responsible for any missing/lost belongings or valuables.   Use CHG Soap as directed on instruction sheet.  Notify your doctor if there is any change in your medical condition (cold, fever, infection).  Wear comfortable clothing (specific to your surgery type) to the hospital.  Plan for stool softeners for home use; pain medications have a tendency to cause constipation. You can also help prevent constipation by eating foods high in fiber such as fruits and vegetables and drinking plenty of fluids as your diet allows.  After surgery, you can help prevent lung complications by doing breathing exercises.  Take deep breaths and cough every 1-2  hours. Your doctor may order a device called an Incentive Spirometer to help you take deep breaths. When coughing or sneezing, hold a pillow firmly against your incision with both hands. This is called "splinting." Doing this helps protect your incision. It also decreases belly discomfort.  If you are being admitted to the hospital overnight, leave your suitcase in the car. After  surgery it may be brought to your room.  If you are being discharged the day of surgery, you will not be allowed to drive home. You will need a responsible adult (18 years or older) to drive you home and stay with you that night.   If you are taking public transportation, you will need to have a responsible adult (18 years or older) with you. Please confirm with your physician that it is acceptable to use public transportation.   Please call the [place] [facility] at [phone] if you have any questions about these instructions.  Visitation Policy:  Patients undergoing a surgery or procedure may have one family member or support person with them as long as that person is not COVID-19 positive or experiencing its symptoms.  That person may remain in the waiting area during the procedure.  Inpatient Visitation Update:   In an effort to ensure the safety of our team members and our patients, we are implementing a change to our visitation policy:  Effective Monday, Aug. 9, at 7 a.m., inpatients will be allowed one support person.  o The support person may change daily.  o The support person must pass our screening, gel in and out, and wear a mask at all times, including in the patient's room.  o Patients must also wear a mask when staff or their support person are in the room.  o Masking is required regardless of vaccination status.  Systemwide, no visitors 17 or younger.

## 2020-04-09 ENCOUNTER — Other Ambulatory Visit: Payer: Self-pay | Admitting: Family Medicine

## 2020-04-09 ENCOUNTER — Other Ambulatory Visit
Admission: RE | Admit: 2020-04-09 | Discharge: 2020-04-09 | Disposition: A | Discharge status: Home / Self Care | Payer: BC Managed Care – PPO | Source: Ambulatory Visit | Attending: Obstetrics and Gynecology | Admitting: Obstetrics and Gynecology

## 2020-04-09 DIAGNOSIS — F411 Generalized anxiety disorder: Secondary | ICD-10-CM

## 2020-04-09 DIAGNOSIS — Z01812 Encounter for preprocedural laboratory examination: Secondary | ICD-10-CM | POA: Diagnosis not present

## 2020-04-09 DIAGNOSIS — Z20822 Contact with and (suspected) exposure to covid-19: Secondary | ICD-10-CM | POA: Diagnosis not present

## 2020-04-09 DIAGNOSIS — F331 Major depressive disorder, recurrent, moderate: Secondary | ICD-10-CM

## 2020-04-09 LAB — CBC
HCT: 39.3 % (ref 36.0–46.0)
Hemoglobin: 13.4 g/dL (ref 12.0–15.0)
MCH: 34.7 pg — ABNORMAL HIGH (ref 26.0–34.0)
MCHC: 34.1 g/dL (ref 30.0–36.0)
MCV: 101.8 fL — ABNORMAL HIGH (ref 80.0–100.0)
Platelets: 207 10*3/uL (ref 150–400)
RBC: 3.86 MIL/uL — ABNORMAL LOW (ref 3.87–5.11)
RDW: 14.7 % (ref 11.5–15.5)
WBC: 4.6 10*3/uL (ref 4.0–10.5)
nRBC: 0 % (ref 0.0–0.2)

## 2020-04-09 MED ORDER — BUSPIRONE HCL 10 MG PO TABS: 10.0000 mg | ORAL_TABLET | Freq: Two times a day (BID) | ORAL | 1 refills | Status: DC

## 2020-04-10 LAB — TYPE AND SCREEN
ABO/RH(D): A POS
Antibody Screen: NEGATIVE

## 2020-04-10 LAB — SARS CORONAVIRUS 2 (TAT 6-24 HRS): SARS Coronavirus 2: NEGATIVE

## 2020-04-12 ENCOUNTER — Inpatient Hospital Stay: Payer: BC Managed Care – PPO | Admitting: Certified Registered"

## 2020-04-12 ENCOUNTER — Observation Stay
Admission: RE | Admit: 2020-04-12 | Discharge: 2020-04-14 | Disposition: A | Discharge status: Home / Self Care | Payer: BC Managed Care – PPO | Attending: Obstetrics and Gynecology | Admitting: Obstetrics and Gynecology

## 2020-04-12 ENCOUNTER — Encounter: Payer: Self-pay | Admitting: Obstetrics and Gynecology

## 2020-04-12 ENCOUNTER — Other Ambulatory Visit: Payer: Self-pay

## 2020-04-12 ENCOUNTER — Encounter
Admission: RE | Disposition: A | Discharge status: Home / Self Care | Payer: Self-pay | Source: Home / Self Care | Attending: Obstetrics and Gynecology

## 2020-04-12 DIAGNOSIS — R102 Pelvic and perineal pain unspecified side: Secondary | ICD-10-CM

## 2020-04-12 DIAGNOSIS — D259 Leiomyoma of uterus, unspecified: Principal | ICD-10-CM | POA: Insufficient documentation

## 2020-04-12 DIAGNOSIS — N393 Stress incontinence (female) (male): Secondary | ICD-10-CM | POA: Diagnosis not present

## 2020-04-12 DIAGNOSIS — N8189 Other female genital prolapse: Secondary | ICD-10-CM

## 2020-04-12 DIAGNOSIS — I10 Essential (primary) hypertension: Secondary | ICD-10-CM | POA: Diagnosis not present

## 2020-04-12 DIAGNOSIS — Z9889 Other specified postprocedural states: Secondary | ICD-10-CM

## 2020-04-12 DIAGNOSIS — F17210 Nicotine dependence, cigarettes, uncomplicated: Secondary | ICD-10-CM | POA: Diagnosis not present

## 2020-04-12 HISTORY — PX: ANTERIOR AND POSTERIOR REPAIR: SHX5121

## 2020-04-12 HISTORY — PX: BLADDER SUSPENSION: SHX72

## 2020-04-12 HISTORY — PX: LAPAROSCOPIC VAGINAL HYSTERECTOMY WITH SALPINGO OOPHORECTOMY: SHX6681

## 2020-04-12 LAB — URINE DRUG SCREEN, QUALITATIVE (ARMC ONLY)
Amphetamines, Ur Screen: NOT DETECTED
Barbiturates, Ur Screen: NOT DETECTED
Benzodiazepine, Ur Scrn: NOT DETECTED
Cannabinoid 50 Ng, Ur ~~LOC~~: POSITIVE — AB
Cocaine Metabolite,Ur ~~LOC~~: NOT DETECTED
MDMA (Ecstasy)Ur Screen: NOT DETECTED
Methadone Scn, Ur: NOT DETECTED
Opiate, Ur Screen: NOT DETECTED
Phencyclidine (PCP) Ur S: NOT DETECTED
Tricyclic, Ur Screen: POSITIVE — AB

## 2020-04-12 LAB — ABO/RH: ABO/RH(D): A POS

## 2020-04-12 LAB — POCT PREGNANCY, URINE: Preg Test, Ur: NEGATIVE

## 2020-04-12 SURGERY — HYSTERECTOMY, VAGINAL, LAPAROSCOPY-ASSISTED, WITH SALPINGO-OOPHORECTOMY
Anesthesia: General

## 2020-04-12 MED ORDER — VASOPRESSIN 20 UNIT/ML IV SOLN
INTRAVENOUS | Status: AC
  Filled 2020-04-12: qty 1

## 2020-04-12 MED ORDER — FENTANYL CITRATE (PF) 100 MCG/2ML IJ SOLN
INTRAMUSCULAR | Status: AC
  Filled 2020-04-12: qty 2

## 2020-04-12 MED ORDER — ONDANSETRON HCL 4 MG PO TABS: 4.0000 mg | ORAL_TABLET | Freq: Four times a day (QID) | ORAL | Status: DC | PRN

## 2020-04-12 MED ORDER — VASOPRESSIN 20 UNIT/ML IV SOLN
INTRAVENOUS | Status: DC | PRN
  Administered 2020-04-12: 10:00:00 2 mL via INTRAMUSCULAR
  Administered 2020-04-12: 09:00:00 5 mL via INTRAMUSCULAR
  Administered 2020-04-12: 10:00:00 4 mL via INTRAMUSCULAR
  Administered 2020-04-12: 09:00:00 10 mL via INTRAMUSCULAR

## 2020-04-12 MED ORDER — PROPOFOL 10 MG/ML IV BOLUS
INTRAVENOUS | Status: DC | PRN
  Administered 2020-04-12: 150 mg via INTRAVENOUS

## 2020-04-12 MED ORDER — LIDOCAINE HCL (CARDIAC) PF 100 MG/5ML IV SOSY
PREFILLED_SYRINGE | INTRAVENOUS | Status: DC | PRN
  Administered 2020-04-12: 100 mg via INTRAVENOUS

## 2020-04-12 MED ORDER — ONDANSETRON HCL 4 MG/2ML IJ SOLN: 4.0000 mg | Freq: Once | INTRAMUSCULAR | Status: DC | PRN

## 2020-04-12 MED ORDER — SEVOFLURANE IN SOLN
RESPIRATORY_TRACT | Status: AC
  Filled 2020-04-12: qty 250

## 2020-04-12 MED ORDER — CEFAZOLIN SODIUM-DEXTROSE 2-4 GM/100ML-% IV SOLN: 2.0000 g | INTRAVENOUS | Status: DC

## 2020-04-12 MED ORDER — CEFAZOLIN SODIUM-DEXTROSE 2-3 GM-%(50ML) IV SOLR
INTRAVENOUS | Status: DC | PRN
  Administered 2020-04-12: 2 g via INTRAVENOUS

## 2020-04-12 MED ORDER — ORAL CARE MOUTH RINSE: 15.0000 mL | Freq: Once | OROMUCOSAL | Status: AC

## 2020-04-12 MED ORDER — SIMETHICONE 80 MG PO CHEW
80.0000 mg | CHEWABLE_TABLET | Freq: Four times a day (QID) | ORAL | Status: DC | PRN
  Administered 2020-04-13 (×2): 80 mg via ORAL
  Filled 2020-04-12 (×2): qty 1

## 2020-04-12 MED ORDER — BUPIVACAINE HCL 0.5 % IJ SOLN
INTRAMUSCULAR | Status: DC | PRN
  Administered 2020-04-12: 10 mL

## 2020-04-12 MED ORDER — OXYCODONE HCL 5 MG/5ML PO SOLN: 5.0000 mg | Freq: Once | ORAL | Status: DC | PRN

## 2020-04-12 MED ORDER — LACTATED RINGERS IV SOLN: INTRAVENOUS | Status: DC

## 2020-04-12 MED ORDER — ROCURONIUM BROMIDE 10 MG/ML (PF) SYRINGE
PREFILLED_SYRINGE | INTRAVENOUS | Status: AC
  Filled 2020-04-12: qty 10

## 2020-04-12 MED ORDER — OXYCODONE-ACETAMINOPHEN 5-325 MG PO TABS
1.0000 | ORAL_TABLET | ORAL | Status: DC | PRN
Start: 2020-04-12 — End: 2020-04-15
  Administered 2020-04-12 – 2020-04-13 (×7): 2 via ORAL
  Administered 2020-04-13: 1 via ORAL
  Administered 2020-04-14 (×4): 2 via ORAL
  Filled 2020-04-12 (×12): qty 2

## 2020-04-12 MED ORDER — FENTANYL CITRATE (PF) 100 MCG/2ML IJ SOLN
25.0000 ug | INTRAMUSCULAR | Status: DC | PRN
  Administered 2020-04-12 (×2): 50 ug via INTRAVENOUS

## 2020-04-12 MED ORDER — CHLORHEXIDINE GLUCONATE 0.12 % MT SOLN
OROMUCOSAL | Status: AC
  Administered 2020-04-12: 07:00:00 15 mL via OROMUCOSAL
  Filled 2020-04-12: qty 15

## 2020-04-12 MED ORDER — POVIDONE-IODINE 10 % EX SWAB: 2.0000 "application " | Freq: Once | CUTANEOUS | Status: DC

## 2020-04-12 MED ORDER — SODIUM CHLORIDE (PF) 0.9 % IJ SOLN
INTRAMUSCULAR | Status: AC
  Filled 2020-04-12: qty 50

## 2020-04-12 MED ORDER — CHLORHEXIDINE GLUCONATE 0.12 % MT SOLN: 15.0000 mL | Freq: Once | OROMUCOSAL | Status: AC

## 2020-04-12 MED ORDER — ONDANSETRON HCL 4 MG/2ML IJ SOLN
INTRAMUSCULAR | Status: AC
  Filled 2020-04-12: qty 2

## 2020-04-12 MED ORDER — OXYCODONE HCL 5 MG PO TABS
5.0000 mg | ORAL_TABLET | Freq: Once | ORAL | Status: DC | PRN
Start: 2020-04-12 — End: 2020-04-12

## 2020-04-12 MED ORDER — MIDAZOLAM HCL 2 MG/2ML IJ SOLN
INTRAMUSCULAR | Status: AC
  Filled 2020-04-12: qty 2

## 2020-04-12 MED ORDER — SUGAMMADEX SODIUM 500 MG/5ML IV SOLN
INTRAVENOUS | Status: DC | PRN
  Administered 2020-04-12: 200 mg via INTRAVENOUS

## 2020-04-12 MED ORDER — ESTROGENS, CONJUGATED 0.625 MG/GM VA CREA
TOPICAL_CREAM | VAGINAL | Status: AC
  Filled 2020-04-12: qty 30

## 2020-04-12 MED ORDER — ROCURONIUM BROMIDE 100 MG/10ML IV SOLN
INTRAVENOUS | Status: DC | PRN
  Administered 2020-04-12: 90 mg via INTRAVENOUS

## 2020-04-12 MED ORDER — ACETAMINOPHEN 10 MG/ML IV SOLN
INTRAVENOUS | Status: DC | PRN
  Administered 2020-04-12: 1000 mg via INTRAVENOUS

## 2020-04-12 MED ORDER — FENTANYL CITRATE (PF) 250 MCG/5ML IJ SOLN
INTRAMUSCULAR | Status: AC
  Filled 2020-04-12: qty 5

## 2020-04-12 MED ORDER — PHENYLEPHRINE HCL (PRESSORS) 10 MG/ML IV SOLN
INTRAVENOUS | Status: DC | PRN
  Administered 2020-04-12: 250 ug via INTRAVENOUS

## 2020-04-12 MED ORDER — OXYBUTYNIN CHLORIDE 5 MG PO TABS
5.0000 mg | ORAL_TABLET | Freq: Three times a day (TID) | ORAL | Status: DC | PRN
  Administered 2020-04-12 – 2020-04-14 (×6): 5 mg via ORAL
  Filled 2020-04-12 (×8): qty 1

## 2020-04-12 MED ORDER — DEXTROSE IN LACTATED RINGERS 5 % IV SOLN: INTRAVENOUS | Status: DC

## 2020-04-12 MED ORDER — CEFAZOLIN SODIUM-DEXTROSE 2-4 GM/100ML-% IV SOLN
INTRAVENOUS | Status: AC
  Filled 2020-04-12: qty 100

## 2020-04-12 MED ORDER — ACETAMINOPHEN 10 MG/ML IV SOLN
INTRAVENOUS | Status: AC
  Filled 2020-04-12: qty 100

## 2020-04-12 MED ORDER — KETOROLAC TROMETHAMINE 30 MG/ML IJ SOLN: 30.0000 mg | Freq: Once | INTRAMUSCULAR | Status: DC

## 2020-04-12 MED ORDER — FAMOTIDINE 20 MG PO TABS: 20.0000 mg | ORAL_TABLET | Freq: Once | ORAL | Status: AC

## 2020-04-12 MED ORDER — PROPOFOL 10 MG/ML IV BOLUS
INTRAVENOUS | Status: AC
  Filled 2020-04-12: qty 20

## 2020-04-12 MED ORDER — ONDANSETRON HCL 4 MG/2ML IJ SOLN
INTRAMUSCULAR | Status: DC | PRN
  Administered 2020-04-12: 4 mg via INTRAVENOUS

## 2020-04-12 MED ORDER — MIDAZOLAM HCL 2 MG/2ML IJ SOLN
INTRAMUSCULAR | Status: DC | PRN
  Administered 2020-04-12: 2 mg via INTRAVENOUS

## 2020-04-12 MED ORDER — DEXAMETHASONE SODIUM PHOSPHATE 10 MG/ML IJ SOLN
INTRAMUSCULAR | Status: DC | PRN
  Administered 2020-04-12: 10 mg via INTRAVENOUS

## 2020-04-12 MED ORDER — FENTANYL CITRATE (PF) 100 MCG/2ML IJ SOLN
INTRAMUSCULAR | Status: DC | PRN
  Administered 2020-04-12: 100 ug via INTRAVENOUS
  Administered 2020-04-12: 50 ug via INTRAVENOUS
  Administered 2020-04-12: 100 ug via INTRAVENOUS

## 2020-04-12 MED ORDER — FAMOTIDINE 20 MG PO TABS
ORAL_TABLET | ORAL | Status: AC
  Administered 2020-04-12: 07:00:00 20 mg
  Filled 2020-04-12: qty 1

## 2020-04-12 MED ORDER — IBUPROFEN 800 MG PO TABS
800.0000 mg | ORAL_TABLET | Freq: Three times a day (TID) | ORAL | Status: DC
  Administered 2020-04-12 – 2020-04-14 (×7): 800 mg via ORAL
  Filled 2020-04-12 (×7): qty 1

## 2020-04-12 MED ORDER — KETOROLAC TROMETHAMINE 30 MG/ML IJ SOLN
INTRAMUSCULAR | Status: DC | PRN
  Administered 2020-04-12: 30 mg via INTRAVENOUS

## 2020-04-12 MED ORDER — FLAVOXATE HCL 100 MG PO TABS
100.0000 mg | ORAL_TABLET | Freq: Three times a day (TID) | ORAL | Status: DC | PRN
  Filled 2020-04-12 (×2): qty 1

## 2020-04-12 MED ORDER — ONDANSETRON HCL 4 MG/2ML IJ SOLN: 4.0000 mg | Freq: Four times a day (QID) | INTRAMUSCULAR | Status: DC | PRN

## 2020-04-12 MED ORDER — LIDOCAINE HCL (PF) 2 % IJ SOLN
INTRAMUSCULAR | Status: AC
  Filled 2020-04-12: qty 5

## 2020-04-12 MED ORDER — DEXAMETHASONE SODIUM PHOSPHATE 10 MG/ML IJ SOLN
INTRAMUSCULAR | Status: AC
  Filled 2020-04-12: qty 1

## 2020-04-12 SURGICAL SUPPLY — 62 items
ADHESIVE MASTISOL STRL (MISCELLANEOUS) ×4 IMPLANT
BAG DECANTER FOR FLEXI CONT (MISCELLANEOUS) IMPLANT
BAG URINE DRAIN 2000ML AR STRL (UROLOGICAL SUPPLIES) ×4 IMPLANT
BAG URINE DRAIN UROCATCH STRL (MISCELLANEOUS) ×4 IMPLANT
BLADE SURG 15 STRL SS SAFETY (BLADE) ×4 IMPLANT
BLADE SURG SZ10 CARB STEEL (BLADE) ×4 IMPLANT
BLADE SURG SZ11 CARB STEEL (BLADE) ×4 IMPLANT
CATH FOLEY 2WAY  5CC 16FR (CATHETERS) ×2
CATH FOLEY 2WAY SIL 16X30 (CATHETERS) ×4 IMPLANT
CATH URTH 16FR FL 2W BLN LF (CATHETERS) ×2 IMPLANT
CHLORAPREP W/TINT 26 (MISCELLANEOUS) ×4 IMPLANT
CLEANER CAUTERY TIP 5X5 PAD (MISCELLANEOUS) ×2 IMPLANT
CLOSURE WOUND 1/2 X4 (GAUZE/BANDAGES/DRESSINGS) ×2
COVER MAYO STAND REUSABLE (DRAPES) ×4 IMPLANT
COVER WAND RF STERILE (DRAPES) ×4 IMPLANT
DEFOGGER SCOPE WARMER CLEARIFY (MISCELLANEOUS) ×4 IMPLANT
DRAPE PERI LITHO V/GYN (MISCELLANEOUS) IMPLANT
DRAPE UTILITY 15X26 TOWEL STRL (DRAPES) IMPLANT
DRAPE XRAY CASSETTE 23X24 (DRAPES) IMPLANT
ELECT REM PT RETURN 9FT ADLT (ELECTROSURGICAL) ×4
ELECTRODE REM PT RTRN 9FT ADLT (ELECTROSURGICAL) ×2 IMPLANT
GAUZE 4X4 16PLY RFD (DISPOSABLE) ×12 IMPLANT
GLOVE BIO SURGEON STRL SZ8 (GLOVE) ×16 IMPLANT
GLOVE BIOGEL PI ORTHO PRO 7.5 (GLOVE) ×6
GLOVE PI ORTHO PRO STRL 7.5 (GLOVE) ×6 IMPLANT
GOWN STRL REUS W/ TWL LRG LVL3 (GOWN DISPOSABLE) ×4 IMPLANT
GOWN STRL REUS W/ TWL XL LVL3 (GOWN DISPOSABLE) ×4 IMPLANT
GOWN STRL REUS W/TWL LRG LVL3 (GOWN DISPOSABLE) ×4
GOWN STRL REUS W/TWL XL LVL3 (GOWN DISPOSABLE) ×4
HANDLE YANKAUER SUCT BULB TIP (MISCELLANEOUS) ×4 IMPLANT
IRRIGATION STRYKERFLOW (MISCELLANEOUS) IMPLANT
IRRIGATOR STRYKERFLOW (MISCELLANEOUS)
IV LACTATED RINGERS 1000ML (IV SOLUTION) IMPLANT
KIT PINK PAD W/HEAD ARE REST (MISCELLANEOUS) ×4
KIT PINK PAD W/HEAD ARM REST (MISCELLANEOUS) ×2 IMPLANT
KIT TURNOVER CYSTO (KITS) ×4 IMPLANT
LIGASURE LAP MARYLAND 5MM 37CM (ELECTROSURGICAL) ×4 IMPLANT
MANIFOLD NEPTUNE II (INSTRUMENTS) ×4 IMPLANT
NEEDLE HYPO 22GX1.5 SAFETY (NEEDLE) ×4 IMPLANT
PACK BASIN MINOR (MISCELLANEOUS) ×4 IMPLANT
PACK GYN LAPAROSCOPIC (MISCELLANEOUS) ×4 IMPLANT
PAD CLEANER CAUTERY TIP 5X5 (MISCELLANEOUS) ×2
PAD OB MATERNITY 4.3X12.25 (PERSONAL CARE ITEMS) ×4 IMPLANT
PAD PREP 24X41 OB/GYN DISP (PERSONAL CARE ITEMS) ×4 IMPLANT
RETRACTOR PHONTONGUIDE ADAPT (ADAPTER) ×4 IMPLANT
SLEEVE ENDOPATH XCEL 5M (ENDOMECHANICALS) ×8 IMPLANT
SLING TRANSOBTURATOR OBTRYX (Sling) ×4 IMPLANT
SOLUTION ELECTROLUBE (MISCELLANEOUS) ×4 IMPLANT
SPONGE LAP 18X18 RF (DISPOSABLE) ×4 IMPLANT
STRIP CLOSURE SKIN 1/2X4 (GAUZE/BANDAGES/DRESSINGS) ×6 IMPLANT
SURGILUBE 2OZ TUBE FLIPTOP (MISCELLANEOUS) ×4 IMPLANT
SUT VIC AB 0 CT1 27 (SUTURE) ×4
SUT VIC AB 0 CT1 27XCR 8 STRN (SUTURE) ×4 IMPLANT
SUT VIC AB 0 CT1 36 (SUTURE) ×12 IMPLANT
SUT VIC AB 2-0 UR6 27 (SUTURE) ×12 IMPLANT
SUT VIC AB 4-0 FS2 27 (SUTURE) ×4 IMPLANT
SWABSTK COMLB BENZOIN TINCTURE (MISCELLANEOUS) ×4 IMPLANT
SYR 10ML LL (SYRINGE) ×8 IMPLANT
TAPE TRANSPORE STRL 2 31045 (GAUZE/BANDAGES/DRESSINGS) ×4 IMPLANT
TOWEL OR 17X26 4PK STRL BLUE (TOWEL DISPOSABLE) ×4 IMPLANT
TROCAR XCEL NON-BLD 5MMX100MML (ENDOMECHANICALS) ×4 IMPLANT
TUBING EVAC SMOKE HEATED PNEUM (TUBING) ×4 IMPLANT

## 2020-04-12 NOTE — Anesthesia Procedure Notes (Signed)
Procedure Name: Intubation Performed by: Gaynelle Cage, CRNA Pre-anesthesia Checklist: Patient identified, Emergency Drugs available, Suction available and Patient being monitored Patient Re-evaluated:Patient Re-evaluated prior to induction Oxygen Delivery Method: Circle system utilized Preoxygenation: Pre-oxygenation with 100% oxygen Induction Type: IV induction Ventilation: Mask ventilation without difficulty and Oral airway inserted - appropriate to patient size Laryngoscope Size: Mac and 3 Grade View: Grade II Tube type: Oral Tube size: 7.5 mm Number of attempts: 1 Airway Equipment and Method: Oral airway Placement Confirmation: ETT inserted through vocal cords under direct vision,  positive ETCO2 and breath sounds checked- equal and bilateral Secured at: 21 cm Tube secured with: Tape Dental Injury: Teeth and Oropharynx as per pre-operative assessment

## 2020-04-12 NOTE — Anesthesia Preprocedure Evaluation (Signed)
Anesthesia Evaluation  Patient identified by MRN, date of birth, ID band Patient awake    Reviewed: Allergy & Precautions, H&P , NPO status , Patient's Chart, lab work & pertinent test results  History of Anesthesia Complications Negative for: history of anesthetic complications  Airway Mallampati: II  TM Distance: >3 FB     Dental  (+) Edentulous Upper, Edentulous Lower   Pulmonary neg sleep apnea, neg COPD, former smoker,    breath sounds clear to auscultation       Cardiovascular hypertension, (-) angina(-) Past MI and (-) Cardiac Stents (-) dysrhythmias  Rhythm:regular Rate:Normal     Neuro/Psych  Headaches, PSYCHIATRIC DISORDERS Anxiety Depression Lumbar radiculopathy    GI/Hepatic Neg liver ROS, GERD  ,  Endo/Other  negative endocrine ROS  Renal/GU      Musculoskeletal   Abdominal   Peds  Hematology negative hematology ROS (+)   Anesthesia Other Findings Obesity  Past Medical History: No date: Allergy No date: Anxiety 2011: Cervical cancer ([place])     Comment:  stage 3 No date: Chronic headaches No date: Depression No date: Frequent falls No date: GERD (gastroesophageal reflux disease) No date: Lumbar radiculopathy  Past Surgical History: 2011 or 2012: COLPOSCOPY     Comment:  stage 3-removed cancerous spot on the cervix 10/15/2019: EXCISION OF TONGUE LESION; N/A     Comment:  Procedure: EXCISION OF TONGUE LESION;  Surgeon: Clyde Canterbury, MD;  Location: [facility] ORS;  Service: ENT;                Laterality: N/A; No date: FOOT SURGERY; Right     Comment:  x 2-took knots out from bottom of the foot-not sure of               the technical name 10/15/2019: PAROTIDECTOMY; Right     Comment:  Procedure: PAROTIDECTOMY;  Surgeon: Clyde Canterbury, MD;                Location: [facility] ORS;  Service: ENT;  Laterality: Right; No date: TUBAL LIGATION  BMI    Body Mass Index: 34.97 kg/m       Reproductive/Obstetrics negative OB ROS                             Anesthesia Physical Anesthesia Plan  ASA: III  Anesthesia Plan: General ETT   Post-op Pain Management:    Induction:   PONV Risk Score and Plan: Ondansetron, Dexamethasone, Midazolam and Treatment may vary due to age or medical condition  Airway Management Planned:   Additional Equipment:   Intra-op Plan:   Post-operative Plan:   Informed Consent: I have reviewed the patients History and Physical, chart, labs and discussed the procedure including the risks, benefits and alternatives for the proposed anesthesia with the patient or authorized representative who has indicated his/her understanding and acceptance.     Dental Advisory Given  Plan Discussed with: Anesthesiologist, CRNA and Surgeon  Anesthesia Plan Comments:         Anesthesia Quick Evaluation

## 2020-04-12 NOTE — Transfer of Care (Signed)
Immediate Anesthesia Transfer of Care Note  Patient: Hermenia Bers  Procedure(s) Performed: LAPAROSCOPIC ASSISTED VAGINAL HYSTERECTOMY WITH SALPINGO OOPHORECTOMY (Bilateral ) SPARC PROCEDURE (N/A ) ANTERIOR (CYSTOCELE) AND POSTERIOR REPAIR (RECTOCELE) (N/A )  Patient Location: PACU  Anesthesia Type:General  Level of Consciousness: sedated  Airway & Oxygen Therapy: Patient Spontanous Breathing and Patient connected to face mask oxygen  Post-op Assessment: Report given to RN  Post vital signs: Reviewed and stable  Last Vitals:  Vitals Value Taken Time  BP 146/125 04/12/20 1038  Temp 36.3 C 04/12/20 1039  Pulse 76 04/12/20 1040  Resp 18 04/12/20 1040  SpO2 100 % 04/12/20 1040  Vitals shown include unvalidated device data.  Last Pain:  Vitals:   04/12/20 0635  TempSrc: Oral  PainSc: 9          Complications: No complications documented.

## 2020-04-12 NOTE — Op Note (Signed)
OPERATIVE NOTE 04/12/2020 10:51 AM  PRE-OPERATIVE DIAGNOSIS:  1) Pelvic relaxation Pelvic pain Uterine fibriods Stress urinary Incontinence  POST-OPERATIVE DIAGNOSIS:  Same  OPERATION: Procedure(s) (LRB): LAPAROSCOPIC ASSISTED VAGINAL HYSTERECTOMY WITH SALPINGO OOPHORECTOMY (Bilateral) SPARC PROCEDURE (N/A) ANTERIOR (CYSTOCELE) AND POSTERIOR REPAIR (RECTOCELE) (N/A)   PERINEOPLASTY  SURGEON(S): Surgeon(s) and Role:    Harlin Heys, MD - Primary    * Rubie Maid, MD - Assisting No other capable assistant was available for this surgery which requires an experienced, high level assistant.  She provided exposure, dissection, suctioning, retraction, and general support and assistance during the procedure.   ANESTHESIA: General  ESTIMATED BLOOD LOSS: 200 mL   SPECIMEN:  ID Type Source Tests Collected by Time Destination  1 : Uterus, cervix, BILATERAL fallopian tubes and ovaries Tissue PATH Gyn benign resection SURGICAL PATHOLOGY Harlin Heys, MD [id number]     COMPLICATIONS: None  DRAINS: Foley to gravity  DISPOSITION: Stable to recovery room  DESCRIPTION OF PROCEDURE:      The patient was prepped and draped in the dorsolithotomy position and placed under general anesthesia. The bladder was emptied. The cervix was grasped with a multi-toothed tenaculum and a uterine manipulator was placed within the cervical os respecting the position and curvature of the uterus. After changing gloves we proceeded abdominally. A small infraumbilical incision was made and a 5 mm trocar port was placed within the abdominopelvic cavity. The opening pressure was less than 7 mmHg.  Approximately 3 and 1/2 L of carbon dioxide gas was instilled within the abdominal pelvic cavity. The laparoscope was placed and the pelvis and abdomen were carefully inspected. In the usual manner, under direct visualization right and left lower quadrant ports of 5 mm size were placed. Both  ureters were identified in the pelvis prior to dissection or clamping and cutting of pedicles. A window was made in the broad ligament and the infundibulopelvic ligaments were carefully identified. The ligaments were clamped divided and doubly suture ligated. Hemostasis of the pedicles was noted. The fallopian tubes were elevated and the mesenteric side systematically coagulated and divided allowing the tube to be removed at the time of uterine removal. The round ligaments were coagulated and divided and a bladder flap was created. The upper aspect of the broad ligament was clamped coagulated and divided. The uterine arteries were skeletonized, triply coagulated and divided. Careful inspection of all pedicles and the remainder of the pelvis was performed. Hemostasis was noted. The lower quadrant ports were removed, hemostasis of the port sites was noted, and the incisions were closed in subcuticular manner. The laparoscope and trocar sleeve were removed from the infraumbilical incision, hemostasis was noted, and the incision was closed in a subcuticular manner. A long-acting anesthetic was employed in the skin incisions. We then proceeded vaginally. A weighted speculum was placed posteriorly. A multi-toothed tenaculum was used to grasp the cervix and the cervix was injected in a circumferential manner with a dilute Pitressin solution. An incision was made around the cervix and the vaginal mucosa was dissected off of the cervix. The posterior cul-de-sac was identified and entered and the weighted speculum was placed within this. The anterior cul-de-sac was identified and entered and a retractor was placed and used to retract the bladder anteriorly keeping it out of the operative field. The uterosacral ligaments were clamped divided and suture ligated. The cardinal ligaments were clamped divided and suture ligated. The small remaining pedicle was clamped divided and suture ligated bilaterally allowing  delivery of  the specimen. Angle sutures were placed in the usual manner. A culdoplasty was performed. The peritoneum was identified anteriorly and then incorporating the left upper pedicle left lower pedicle right lower pedicle right upper pedicle and anterior peritoneum a pursestring suture was placed exteriorizing all pedicles. Hemostasis of all pedicles was noted at this time.  The vaginal mucosa beginning at the vaginal cuff and overlying the bladder, was grasped with Allis clamps and injected with a dilute Pitressin solution in the midline. A midline incision was made to the level of the urethra. The vaginal mucosa was dissected laterally from the underlying attenuated fascia. A Foley catheter was placed within the bladder and the bladder was emptied. Clear urine was noted. The obturator foramina were identified in the usual manner bilaterally and marked with a marking pen the skin and subcutaneous tissues were injected with a dilute Pitressin solution. Stab incisions were made and the TOT trochars were placed through these incisions onto the operator's finger in the vagina which was retracted and bladder medially. The vaginal tape was then placed on the trochars and reversed through these incisions. A Kelly clamp was placed under the tape and the sleeves of the tape were removed. The tape was noted to be correctly positioned underneath the urethra without twists. The excess tape was removed at the level of the skin. Steri-Strips were applied over these small skin incisions. A typical Kelly plication was performed carefully covering the tension-free vaginal tape with thickened fascia. The bladder was plicated several sutures of 3-0 Vicryl.  A shelf of fascia was then approximated in the midline placing the bladder back in its more anatomic position.The excess vaginal mucosa was trimmed. Vaginal mucosa was then closed in the midline with interrupted sutures to the level of the vaginal cuff. The vaginal cuff was closed  with Vicryl suture. Hemostasis was noted. The posterior fourchette at approximately the hymenal ring was grasped using Allis clamps. The posterior vaginal mucosa was injected in the midline with a dilute Pitressin solution. A midline incision was made through the vaginal mucosa to the level of the vaginal cuff, and the vaginal mucosa was dissected laterally exposing the underlying attenuated fascia. Peritoneum over lying fatty tissue was identified and the enterocoele sac was tied of and approximated in a purse-string manner. Beginning at the vaginal cuff the attenuated fascia was grasped laterally and approximated in the midline thickening and tightening the fascia. These sutures were carried down to the level of the perineum. The excess vaginal mucosa was trimmed. The vagina was closed with interrupted sutures beginning at the vaginal cuff and carried down toward the perineal body. The perineal body was reinforced with multiple sutures of Vicryl. The mucosa was then closed over the perineal body in a subcuticular manner. Hemostasis was noted.  Clear urine was noted in the Foley.

## 2020-04-12 NOTE — H&P (Signed)
PRE-OPERATIVE HISTORY AND PHYSICAL EXAM  PCP:  [name], MD Subjective:   HPI:  Erin Dorsey is a 49 y.o. [phone].  Patient's last menstrual period was 06/23/2015.  She presents today for a pre-op discussion and PE.  She continues to experience pelvic pressure symptoms daily with pelvic pain 2-3 times per week. She complains of pulling and feeling like something is falling out of the vagina. She reports her pain to be worse with intercourse. She describes no problems with bowel movements. She also complains of daily urine loss. She has urine loss with coughing laughing and sneezing. She states that she has not had a period in more than 2 years and believes that she is in menopause. She describes 3 uneventful vaginal births with all babies weighing under 8 pounds.   Review of Systems:   Constitutional: Denied constitutional symptoms, night sweats, recent illness, fatigue, fever, insomnia and weight loss.  Eyes: Denied eye symptoms, eye pain, photophobia, vision change and visual disturbance.  Ears/Nose/Throat/Neck: Denied ear, nose, throat or neck symptoms, hearing loss, nasal discharge, sinus congestion and sore throat.  Cardiovascular: Denied cardiovascular symptoms, arrhythmia, chest pain/pressure, edema, exercise intolerance, orthopnea and palpitations.  Respiratory: Denied pulmonary symptoms, asthma, pleuritic pain, productive sputum, cough, dyspnea and wheezing.  Gastrointestinal: Denied, gastro-esophageal reflux, melena, nausea and vomiting.  Genitourinary: See HPI for additional information.  Musculoskeletal: Denied musculoskeletal symptoms, stiffness, swelling, muscle weakness and myalgia.  Dermatologic: Denied dermatology symptoms, rash and scar.  Neurologic: Denied neurology symptoms, dizziness, headache, neck pain and syncope.  Psychiatric: Denied psychiatric symptoms, anxiety and depression.  Endocrine:  Denied endocrine symptoms including hot flashes and night sweats.                   OB History  Gravida Para Term Preterm AB Living  3 3 3     3   SAB TAB Ectopic Multiple Live Births             3       # Outcome Date GA Lbr Len/2nd Weight Sex Delivery Anes PTL Lv  3 Term 06/15/00    F Vag-Spont   LIV  2 Term 11/25/92    F Vag-Spont   LIV  1 Term 12/12/90    F Vag-Spont   LIV        Past Medical History:  Diagnosis Date  . Allergy   . Anxiety   . Cervical cancer ([place]) 2011   stage 3  . Chronic headaches   . Depression   . Frequent falls   . GERD (gastroesophageal reflux disease)          Past Surgical History:  Procedure Laterality Date  . COLPOSCOPY  2011 or 2012   stage 3-removed cancerous spot on the cervix  . EXCISION OF TONGUE LESION N/A 10/15/2019   Procedure: EXCISION OF TONGUE LESION;  Surgeon: Clyde Canterbury, MD;  Location: [facility] ORS;  Service: ENT;  Laterality: N/A;  . FOOT SURGERY Right    x 2-took knots out from bottom of the foot-not sure of the technical name  . PAROTIDECTOMY Right 10/15/2019   Procedure: PAROTIDECTOMY;  Surgeon: Clyde Canterbury, MD;  Location: [facility] ORS;  Service: ENT;  Laterality: Right;  . TUBAL LIGATION        SOCIAL HISTORY: Social History       Tobacco Use  Smoking Status Former Smoker  . Packs/day: 0.25  . Years: 20.00  . Pack years: 5.00  . Types: Cigarettes  . Quit date: 09/01/2019  . 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 Frequency: 1.0 times per week  . Types: Marijuana         Family History  Problem Relation Age of Onset  . Hypertension Mother   . Kidney disease Father        was on dialysis  . Uterine cancer Sister   . Cervical  cancer Sister   . Deafness Daughter   . Cervical cancer Maternal Grandmother   . Lung cancer Maternal Grandfather   . Brain cancer Maternal Grandfather   . Thyroid disease Daughter   . Breast cancer Paternal Aunt     ALLERGIES:  Amoxicillin and Penicillins  MEDS:                     Current Outpatient Medications on File Prior to Visit  Medication Sig Dispense Refill  . amitriptyline (ELAVIL) 25 MG tablet TAKE 1-2 TABLETS (25-50 MG TOTAL) BY MOUTH AT BEDTIME. 180 tablet 0  . busPIRone (BUSPAR) 5 MG tablet Take 1 tablet (5 mg total) by mouth 2 (two) times daily. 60 tablet 2  . clindamycin (CLEOCIN) 150 MG capsule Take by mouth 3 (three) times daily.    . cyclobenzaprine (FLEXERIL) 10 MG tablet Take 5-10 mg by mouth at bedtime.    . fluconazole (DIFLUCAN) 150 MG tablet Take 1 tablet by mouth. If symptoms continue after 3 days, you may take the 2nd tablet. 1 tablet 1  . FLUoxetine HCl 60 MG TABS Take 60 mg by mouth daily. 90 tablet 3  . Hydrocortisone Acetate (VAGISIL) 1 % CREA Apply 1 application topically daily as needed (heat rash).    . hydrOXYzine (ATARAX/VISTARIL) 25 MG tablet Take 1 tablet (25 mg total) by mouth every 8 (eight) hours as needed. 60 tablet 3  . nystatin (MYCOSTATIN/NYSTOP) powder APPLY TOPICALLY 3 (THREE) TIMES DAILY. 15 g 0  . valACYclovir (VALTREX) 500 MG tablet Take 1 tablet (500 mg total) by mouth daily. Can increase to twice a day for 5 days in the event of a recurrence 30 tablet 12  . [DISCONTINUED] FLUoxetine (PROZAC) 40 MG capsule Take 1 capsule (40 mg total) by mouth daily. 90 capsule 0  . [DISCONTINUED] hydrOXYzine (ATARAX/VISTARIL) 25 MG tablet TAKE 1 TO 2 TABLETS BY MOUTH 3 TIMES A DAY AS NEEDED FOR ANXIETY 30 tablet 0   No current facility-administered medications on file prior to visit.        Meds ordered this encounter  Medications  . metroNIDAZOLE (FLAGYL) 500 MG tablet    Sig: Take 1 tablet (500 mg total) by mouth 2 (two)  times daily. Begin 5 days prior to scheduled surgery as directed.    Dispense:  10 tablet    Refill:  0    BP 121/85   Pulse 81   Ht 5\' 7"  (1.702 m)  Wt 217 lb 3.2 oz (98.5 kg)   LMP 06/23/2015   BMI 34.02 kg/m   Physical examination General NAD, Conversant  HEENT Atraumatic; Op clear with mmm.  Normo-cephalic. Pupils reactive. Anicteric sclerae  Thyroid/Neck Smooth without nodularity or enlargement. Normal ROM.  Neck Supple.  Skin No rashes, lesions or ulceration. Normal palpated skin turgor. No nodularity.  Breasts: No masses or discharge.  Symmetric.  No axillary adenopathy.  Lungs: Clear to auscultation.No rales or wheezes. Normal Respiratory effort, no retractions.  Heart: NSR.  No murmurs or rubs appreciated. No periferal edema  Abdomen: Soft.  Non-tender.  No masses.  No HSM. No hernia  Extremities: Moves all appropriately.  Normal ROM for age. No lymphadenopathy.  Neuro: Oriented to PPT.  Normal mood. Normal affect.   Pelvic:   Vulva: Normal appearance. No lesions.   Vagina: No lesions or abnormalities noted.   Support: Third-degree rectocele second-degree cystocele second-degree uterine prolapse.Some loss of perineal body.   Urethra No masses tenderness or scarring.   Meatus Normal size without lesions or prolapse.   Cervix: Normal appearance. No lesions.   Anus: Normal exam. No lesions.   Perineum: Normal exam. No lesions.   Bimanual   Uterus: 10 weeks size smooth. Non-tender. Mobile. AV.    Adnexae: No masses. Non-tender to palpation.    Cul-de-sac: Negative for abnormality.       Assessment:   [id number]     Patient Active Problem List   Diagnosis Date Noted  . Generalized anxiety disorder 02/03/2020  . Genital herpes simplex 01/12/2020  . 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 Moderate episode of recurrent major depressive disorder ([place]) 10/06/2019  . Candidiasis of skin 10/06/2019  . Tongue lesion 08/06/2019  . Tobacco  use 08/06/2019  . Hx of cervical cancer 08/06/2019  . Right hip pain 08/06/2019  . Obesity (BMI 30.0-34.9) 08/06/2019  . Essential hypertension 08/06/2019  . Nodule of neck 08/06/2019    1. Preop examination   2. Pelvic pain in female   3. Pelvic relaxation disorder   4. Fibroids   5. SUI (stress urinary incontinence, female)      Plan:    Orders:     Meds ordered this encounter  Medications  . metroNIDAZOLE (FLAGYL) 500 MG tablet    Sig: Take 1 tablet (500 mg total) by mouth 2 (two) times daily. Begin 5 days prior to scheduled surgery as directed.    Dispense:  10 tablet    Refill:  0     1.  [facility] A&P TOT

## 2020-04-13 ENCOUNTER — Encounter: Payer: Self-pay | Admitting: Obstetrics and Gynecology

## 2020-04-13 DIAGNOSIS — F17210 Nicotine dependence, cigarettes, uncomplicated: Secondary | ICD-10-CM | POA: Diagnosis not present

## 2020-04-13 DIAGNOSIS — N393 Stress incontinence (female) (male): Secondary | ICD-10-CM | POA: Diagnosis not present

## 2020-04-13 DIAGNOSIS — N8189 Other female genital prolapse: Secondary | ICD-10-CM | POA: Diagnosis not present

## 2020-04-13 DIAGNOSIS — R102 Pelvic and perineal pain: Secondary | ICD-10-CM | POA: Diagnosis not present

## 2020-04-13 DIAGNOSIS — D259 Leiomyoma of uterus, unspecified: Secondary | ICD-10-CM | POA: Diagnosis not present

## 2020-04-13 MED ORDER — ESTRADIOL 1 MG PO TABS
1.0000 mg | ORAL_TABLET | Freq: Every day | ORAL | Status: DC
Start: 2020-04-13 — End: 2020-04-15
  Administered 2020-04-13 – 2020-04-14 (×2): 1 mg via ORAL
  Filled 2020-04-13 (×2): qty 1

## 2020-04-13 NOTE — Progress Notes (Signed)
Pt c/o intense pelvic pressure/discomfort. Pt attempted to void, 225 ml output- no change in pressure/discomfort. Bladder scan residual 239 ml. Foley catheter replaced per Dr. Amalia Hailey and will begin bladder training today. Pt updated on new plan of care going forward.

## 2020-04-13 NOTE — Progress Notes (Signed)
Patient ID: Erin Dorsey, female   DOB: Oct 04, 1970, 49 y.o.   MRN: [id number]       POST-OP NOTE - DAY # 1  Subjective:   The patient continues to experience intermittent pelvic discomfort.  This is possibly consistent with bladder spasms.  She is ambulating well. She is taking PO well. Her pain is mostly controlled with her current medications.  She was able to urinate a small amount this morning but continues to feel pelvic pressure after urination.  She continued to have the urge to urinate and was unable. She denies significant vaginal bleeding.  Objective:  BP 132/85 (BP Location: Right Arm)   Pulse 62   Temp 98 F (36.7 C) (Oral)   Resp 20   Ht 5\' 8"  (1.727 m)   Wt 104.3 kg   LMP 06/23/2015   SpO2 100%   BMI 34.97 kg/m     Abdomen:                          Abdomen soft and nontender without distention, masses        Patient underwent a voiding trial.  She was able to urinate approximately 200 cc but bladder scanner revealed 175 cc remained.  A Foley catheter was replaced and greater than 200 cc was obtained which is consistent with bladder scanner. Patient had an increase in discomfort after Foley catheter replaced.    Assessment:   Doing well.  Slower than normal progress   Patient still experiencing some urinary retention although the ability to void small amounts is a good sign.  Plan:        Will begin bladder training today with voiding trial again tomorrow.   Patient declines discharge with Foley catheter/self cath.  She feels as if she has unable to take care of a catheter or perform self-catheterization.   Finis Bud, M.D. 04/13/2020 2:13 PM

## 2020-04-13 NOTE — Progress Notes (Signed)
Per Dr. Amalia Hailey, Foley catheter removed at 8am- have pt attempt/try to void by 10am- bladder scan residual. Pt updated on current plan of care.

## 2020-04-13 NOTE — Anesthesia Postprocedure Evaluation (Signed)
Anesthesia Post Note  Patient: Erin Dorsey  Procedure(s) Performed: LAPAROSCOPIC ASSISTED VAGINAL HYSTERECTOMY WITH SALPINGO OOPHORECTOMY (Bilateral ) SPARC PROCEDURE (N/A ) ANTERIOR (CYSTOCELE) AND POSTERIOR REPAIR (RECTOCELE) (N/A )  Patient location during evaluation: PACU Anesthesia Type: General Level of consciousness: awake and alert Pain management: pain level controlled Vital Signs Assessment: post-procedure vital signs reviewed and stable Respiratory status: spontaneous breathing, nonlabored ventilation and respiratory function stable Cardiovascular status: blood pressure returned to baseline and stable Postop Assessment: no apparent nausea or vomiting Anesthetic complications: no   No complications documented.   Last Vitals:  Vitals:   04/13/20 0320 04/13/20 0823  BP: 116/60 134/76  Pulse: 69 64  Resp: 18 16  Temp: 36.8 C 36.7 C  SpO2: 95% 100%    Last Pain:  Vitals:   04/13/20 0852  TempSrc:   PainSc: [place]

## 2020-04-14 ENCOUNTER — Other Ambulatory Visit: Payer: Self-pay | Admitting: Family Medicine

## 2020-04-14 DIAGNOSIS — D259 Leiomyoma of uterus, unspecified: Secondary | ICD-10-CM | POA: Diagnosis not present

## 2020-04-14 DIAGNOSIS — N393 Stress incontinence (female) (male): Secondary | ICD-10-CM | POA: Diagnosis not present

## 2020-04-14 DIAGNOSIS — R102 Pelvic and perineal pain: Secondary | ICD-10-CM | POA: Diagnosis not present

## 2020-04-14 DIAGNOSIS — F331 Major depressive disorder, recurrent, moderate: Secondary | ICD-10-CM

## 2020-04-14 DIAGNOSIS — F17210 Nicotine dependence, cigarettes, uncomplicated: Secondary | ICD-10-CM | POA: Diagnosis not present

## 2020-04-14 DIAGNOSIS — N8189 Other female genital prolapse: Secondary | ICD-10-CM | POA: Diagnosis not present

## 2020-04-14 MED ORDER — ESTRADIOL 1 MG PO TABS: 1.0000 mg | ORAL_TABLET | Freq: Every day | ORAL | 1 refills | Status: DC

## 2020-04-14 MED ORDER — OXYCODONE-ACETAMINOPHEN 5-325 MG PO TABS: 1.0000 | ORAL_TABLET | ORAL | 0 refills | Status: DC | PRN

## 2020-04-14 NOTE — Discharge Summary (Signed)
Discharge Summary  Admit date: 04/12/2020  Discharge Date and Time:04/14/2020  5:53 PM  Discharge to:  Home  Admission Diagnosis:  Pelvic relaxation disorder SUI                    Discharge  Diagnoses: Active Problems:   Postoperative state   Other female genital prolapse   Pelvic and perineal pain   Stress incontinence   OR Procedures:   Procedure(s): LAPAROSCOPIC ASSISTED VAGINAL HYSTERECTOMY WITH SALPINGO OOPHORECTOMY SPARC PROCEDURE ANTERIOR (CYSTOCELE) AND POSTERIOR REPAIR (RECTOCELE) PERINEOPLASTY Date -------------------                              Discharge Day Progress Note:   Subjective:   The patient does not have complaints.  She is ambulating well. She is taking PO well. Her pain is well controlled with her current medications. She is urinating and we have been following her for residuals.  Her voided amounts have increased all day and her residuals have decreased.   Objective:  BP 116/81 (BP Location: Left Arm)   Pulse 66   Temp 98.3 F (36.8 C) (Oral)   Resp 20   Ht 5\' 8"  (1.727 m)   Wt 104.3 kg   LMP 06/23/2015   SpO2 99%   BMI 34.97 kg/m     Abdomen:                         clean, dry    Assessment:   Doing well.  Normal progress as expected.   Pt now voiding regularly without difficulty  Plan:        Discharge home.                       Medications as directed.   Instructed to void every 2 hours while awake regardless of urge. If unable to void pt to present immediately to ED  Hospital Course:  No notes on file   Condition at Discharge:  good Discharge Medications:  Allergies as of 04/14/2020      Reactions   Amoxicillin Rash   Penicillins Rash      Medication List    STOP taking these medications   acetaminophen 500 MG tablet Commonly known as: TYLENOL   diclofenac 75 MG EC tablet Commonly known as: VOLTAREN   fluconazole 150 MG tablet Commonly known as: Diflucan   Ibuprofen-Famotidine 800-26.6 MG Tabs    metroNIDAZOLE 500 MG tablet Commonly known as: FLAGYL     TAKE these medications   amitriptyline 25 MG tablet Commonly known as: ELAVIL TAKE 1-2 TABLETS (25-50 MG TOTAL) BY MOUTH AT BEDTIME. What changed: how much to take   busPIRone 10 MG tablet Commonly known as: BUSPAR Take 1 tablet (10 mg total) by mouth 2 (two) times daily.   cyclobenzaprine 10 MG tablet Commonly known as: FLEXERIL TAKE 1/2 TO 1 TABLETS BY MOUTH AT BEDTIME. What changed: See the new instructions.   estradiol 1 MG tablet Commonly known as: ESTRACE Take 1 tablet (1 mg total) by mouth daily. Start taking on: April 15, 2020   FLUoxetine HCl 60 MG Tabs Take 60 mg by mouth daily.   gabapentin 300 MG capsule Commonly known as: NEURONTIN Take 300 mg by mouth 3 (three) times daily.   Hydrocortisone Acetate 1 % Crea Apply 1 application topically daily as needed (heat rash).  hydrOXYzine 25 MG tablet Commonly known as: ATARAX/VISTARIL Take 1 tablet (25 mg total) by mouth every 8 (eight) hours as needed. What changed: when to take this   multivitamin with minerals Tabs tablet Take 1 tablet by mouth daily.   nystatin powder Commonly known as: MYCOSTATIN/NYSTOP APPLY TOPICALLY 3 (THREE) TIMES DAILY. What changed: See the new instructions.   oxyCODONE-acetaminophen 5-325 MG tablet Commonly known as: PERCOCET/ROXICET Take 1-2 tablets by mouth every 4 (four) hours as needed for moderate pain.   sertraline 50 MG tablet Commonly known as: ZOLOFT Take 60 mg by mouth daily.   valACYclovir 500 MG tablet Commonly known as: VALTREX Take 1 tablet (500 mg total) by mouth daily. Can increase to twice a day for 5 days in the event of a recurrence What changed:   when to take this  additional instructions            Discharge Care Instructions  (From admission, onward)         Start     Ordered   04/14/20 0000  No dressing needed       Comments: Keep wound area clean and dry as directed    04/14/20 1752           Follow Up:    Follow-up Information    Harlin Heys, MD Follow up.   Specialties: Obstetrics and Gynecology, Radiology Contact information: [address] [place] [address] [phone]               Finis Bud, M.D. 04/14/2020 5:53 PM

## 2020-04-14 NOTE — Discharge Instructions (Signed)
Special Instructions:   You need to be attempting to void every 2 hours while awake. Every 4 hours throughout the night.   If you go a long time without voiding, then have a lot of pressure on your bladder and are uable to void, report to the emergency room immediately.

## 2020-04-14 NOTE — Progress Notes (Signed)
Discharge order received from doctor. Reviewed discharge instructions and prescriptions with patient and answered all questions. Follow up appointment instructions given. Patient verbalized understanding. Patient discharged home via wheelchair by nursing/auxillary.    Audree Bane, RN

## 2020-04-15 LAB — SURGICAL PATHOLOGY

## 2020-04-19 ENCOUNTER — Other Ambulatory Visit: Payer: Self-pay | Admitting: Family Medicine

## 2020-04-19 ENCOUNTER — Ambulatory Visit: Payer: BC Managed Care – PPO | Admitting: Family Medicine

## 2020-04-20 ENCOUNTER — Encounter: Payer: Self-pay | Admitting: Obstetrics and Gynecology

## 2020-04-20 ENCOUNTER — Other Ambulatory Visit: Payer: Self-pay | Admitting: Family Medicine

## 2020-04-20 ENCOUNTER — Other Ambulatory Visit: Payer: Self-pay

## 2020-04-20 ENCOUNTER — Ambulatory Visit (INDEPENDENT_AMBULATORY_CARE_PROVIDER_SITE_OTHER): Payer: BC Managed Care – PPO | Admitting: Obstetrics and Gynecology

## 2020-04-20 VITALS — BP 114/80 | Ht 68.0 in | Wt 220.1 lb

## 2020-04-20 DIAGNOSIS — F331 Major depressive disorder, recurrent, moderate: Secondary | ICD-10-CM

## 2020-04-20 DIAGNOSIS — Z9889 Other specified postprocedural states: Secondary | ICD-10-CM

## 2020-04-20 DIAGNOSIS — F411 Generalized anxiety disorder: Secondary | ICD-10-CM

## 2020-04-20 DIAGNOSIS — R300 Dysuria: Secondary | ICD-10-CM | POA: Diagnosis not present

## 2020-04-20 DIAGNOSIS — F410 Panic disorder [episodic paroxysmal anxiety] without agoraphobia: Secondary | ICD-10-CM

## 2020-04-20 NOTE — Progress Notes (Signed)
HPI:      [name] is a 49 y.o. [phone] who LMP was Patient's last menstrual period was 06/23/2015.  Subjective:   She presents today 1 week postop from [facility] A&P TOT.  She reports she is generally doing well.  She is voiding regularly.  She does state that her urine stream starts and stops occasionally but she is not having trouble voiding.  She reports she is not leaking urine.  She does report that she had a fairly heavy episode of vaginal bleeding yesterday but this has resolved. She is having bowel movements without difficulty at this time.    Hx: The following portions of the patient's history were reviewed and updated as appropriate:             She  has a past medical history of Allergy, Anxiety, Cervical cancer ([place]) (2011), Chronic headaches, Depression, Frequent falls, GERD (gastroesophageal reflux disease), and Lumbar radiculopathy. She does not have any pertinent problems on file. She  has a past surgical history that includes Foot surgery (Right); Tubal ligation; Colposcopy (2011 or 2012); Parotidectomy (Right, 10/15/2019); Excision of tongue lesion (N/A, 10/15/2019); Laparoscopic vaginal hysterectomy with salpingo oophorectomy (Bilateral, 04/12/2020); Bladder suspension (N/A, 04/12/2020); and Anterior and posterior repair (N/A, 04/12/2020). Her family history includes Brain cancer in her maternal grandfather; Breast cancer in her paternal aunt; Cervical cancer in her maternal grandmother and sister; Deafness in her daughter; Hypertension in her mother; Kidney disease in her father; Lung cancer in her maternal grandfather; Thyroid disease in her daughter; Uterine cancer in her sister. She  reports that she quit smoking about 7 months ago. Her smoking use included cigarettes. She has a 5.00 pack-year smoking history. She has never used smokeless tobacco. She reports previous drug use. Frequency: 1.00 time per week. Drug: Marijuana. She reports that she does not drink  alcohol. She has a current medication list which includes the following prescription(s): amitriptyline, buspirone, cyclobenzaprine, fluoxetine hcl, gabapentin, hydrocortisone acetate, hydroxyzine, multivitamin with minerals, nystatin, oxycodone-acetaminophen, sertraline, valacyclovir, estradiol, [DISCONTINUED] cyclobenzaprine, [DISCONTINUED] fluoxetine, and [DISCONTINUED] hydroxyzine. She is allergic to amoxicillin and penicillins.       Review of Systems:  Review of Systems  Constitutional: Denied constitutional symptoms, night sweats, recent illness, fatigue, fever, insomnia and weight loss.  Eyes: Denied eye symptoms, eye pain, photophobia, vision change and visual disturbance.  Ears/Nose/Throat/Neck: Denied ear, nose, throat or neck symptoms, hearing loss, nasal discharge, sinus congestion and sore throat.  Cardiovascular: Denied cardiovascular symptoms, arrhythmia, chest pain/pressure, edema, exercise intolerance, orthopnea and palpitations.  Respiratory: Denied pulmonary symptoms, asthma, pleuritic pain, productive sputum, cough, dyspnea and wheezing.  Gastrointestinal: Denied, gastro-esophageal reflux, melena, nausea and vomiting.  Genitourinary: Denied genitourinary symptoms including symptomatic vaginal discharge, pelvic relaxation issues, and urinary complaints.  Musculoskeletal: Denied musculoskeletal symptoms, stiffness, swelling, muscle weakness and myalgia.  Dermatologic: Denied dermatology symptoms, rash and scar.  Neurologic: Denied neurology symptoms, dizziness, headache, neck pain and syncope.  Psychiatric: Denied psychiatric symptoms, anxiety and depression.  Endocrine: Denied endocrine symptoms including hot flashes and night sweats.   Meds:   Current Outpatient Medications on File Prior to Visit  Medication Sig Dispense Refill  . amitriptyline (ELAVIL) 25 MG tablet TAKE 1-2 TABLETS (25-50 MG TOTAL) BY MOUTH AT BEDTIME. (Patient taking differently: Take 50 mg by mouth at  bedtime.) 180 tablet 0  . busPIRone (BUSPAR) 10 MG tablet Take 1 tablet (10 mg total) by mouth 2 (two) times daily. 60 tablet 1  . cyclobenzaprine (FLEXERIL) 10 MG tablet  TAKE 1/2 TO 1 TABLETS BY MOUTH AT BEDTIME. (Patient taking differently: Take 10 mg by mouth 2 (two) times daily.) 30 tablet 1  . FLUoxetine HCl 60 MG TABS Take 60 mg by mouth daily. 90 tablet 3  . gabapentin (NEURONTIN) 300 MG capsule Take 300 mg by mouth 3 (three) times daily.    . Hydrocortisone Acetate 1 % CREA Apply 1 application topically daily as needed (heat rash).    . hydrOXYzine (ATARAX/VISTARIL) 25 MG tablet Take 1 tablet (25 mg total) by mouth every 8 (eight) hours as needed. (Patient taking differently: Take 25 mg by mouth 2 (two) times daily.) 60 tablet 3  . Multiple Vitamin (MULTIVITAMIN WITH MINERALS) TABS tablet Take 1 tablet by mouth daily.    [name]. nystatin (MYCOSTATIN/NYSTOP) powder APPLY TOPICALLY 3 (THREE) TIMES DAILY. (Patient taking differently: Apply 1 application topically 3 (three) times daily as needed (irritation).) 15 g 0  . oxyCODONE-acetaminophen (PERCOCET/ROXICET) 5-325 MG tablet Take 1-2 tablets by mouth every 4 (four) hours as needed for moderate pain. 15 tablet 0  . sertraline (ZOLOFT) 50 MG tablet Take 60 mg by mouth daily.    . valACYclovir (VALTREX) 500 MG tablet Take 1 tablet (500 mg total) by mouth daily. Can increase to twice a day for 5 days in the event of a recurrence (Patient taking differently: Take 500 mg by mouth See admin instructions. Take 500 mg daily. Can increase to twice a day for 5 days in the event of a recurrence) 30 tablet 12  . estradiol (ESTRACE) 1 MG tablet Take 1 tablet (1 mg total) by mouth daily. 50 tablet 1  . [DISCONTINUED] cyclobenzaprine (FLEXERIL) 10 MG tablet Take 5-10 mg by mouth at bedtime.    . [DISCONTINUED] FLUoxetine (PROZAC) 40 MG capsule Take 1 capsule (40 mg total) by mouth daily. 90 capsule 0  . [DISCONTINUED] hydrOXYzine (ATARAX/VISTARIL) 25 MG tablet TAKE  1 TO 2 TABLETS BY MOUTH 3 TIMES A DAY AS NEEDED FOR ANXIETY 30 tablet 0   No current facility-administered medications on file prior to visit.          Objective:     Vitals:   04/20/20 1519  BP: 114/80   Filed Weights   04/20/20 1519  Weight: 220 lb 1.6 oz (99.8 kg)               Abdomen: Soft.  Non-tender.  No masses.  No HSM.  Incision/s: Intact.  Healing well.  No erythema.  No drainage.      Assessment:    [id number] Patient Active Problem List   Diagnosis Date Noted  . Postoperative state 04/12/2020  . Other female genital prolapse   . Pelvic and perineal pain   . Stress incontinence   . Generalized anxiety disorder 02/03/2020  . Genital herpes simplex 01/12/2020  . 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 Moderate episode of recurrent major depressive disorder ([facility]) 10/06/2019  . Candidiasis of skin 10/06/2019  . Tongue lesion 08/06/2019  . Tobacco use 08/06/2019  . Hx of cervical cancer 08/06/2019  . Right hip pain 08/06/2019  . Obesity (BMI 30.0-34.9) 08/06/2019  . Essential hypertension 08/06/2019  . Nodule of neck 08/06/2019     1. Dysuria   2. Post-operative state     Patient has some urinary hesitancy -possibly sign of UTI.  She is otherwise doing well postop.   Plan:            1.  Patient may slowly resume activities with exception of  intercourse and nothing in the vagina.  No heavy lifting.  2.  Encouraged to void regularly and not allow her bladder to get overfilled. -We will send urine for culture today because of patient's hesitancy of stream just to be sure.  3.  Patient instructed that if she has any further episodes of heavy bleeding she should call the office for an examination. Orders Orders Placed This Encounter  Procedures  . Urine Culture    No orders of the defined types were placed in this encounter.     F/U  Return in about 5 weeks (around 05/25/2020).  Finis Bud, M.D. 04/20/2020 3:52 PM

## 2020-04-21 NOTE — Telephone Encounter (Signed)
Will route to Dr. Amalia Hailey who she saw yesterday to see if he feels a medicati refill is needed

## 2020-04-28 ENCOUNTER — Other Ambulatory Visit: Payer: Self-pay

## 2020-04-28 ENCOUNTER — Encounter: Payer: Self-pay | Admitting: Obstetrics and Gynecology

## 2020-04-28 ENCOUNTER — Ambulatory Visit (INDEPENDENT_AMBULATORY_CARE_PROVIDER_SITE_OTHER): Payer: BC Managed Care – PPO | Admitting: Obstetrics and Gynecology

## 2020-04-28 ENCOUNTER — Other Ambulatory Visit (HOSPITAL_COMMUNITY)
Admission: RE | Admit: 2020-04-28 | Discharge: 2020-04-28 | Disposition: A | Discharge status: Home / Self Care | Payer: BC Managed Care – PPO | Source: Ambulatory Visit | Attending: Obstetrics and Gynecology | Admitting: Obstetrics and Gynecology

## 2020-04-28 VITALS — BP 129/90 | HR 81 | Ht 68.0 in | Wt 217.9 lb

## 2020-04-28 DIAGNOSIS — R102 Pelvic and perineal pain: Secondary | ICD-10-CM | POA: Diagnosis not present

## 2020-04-28 DIAGNOSIS — N898 Other specified noninflammatory disorders of vagina: Secondary | ICD-10-CM | POA: Diagnosis not present

## 2020-04-28 NOTE — Addendum Note (Signed)
Addended by: Dorian Pod on: 04/28/2020 10:58 AM   Modules accepted: Orders

## 2020-04-28 NOTE — Progress Notes (Signed)
HPI:      [name] is a 49 y.o. [phone] who LMP was Patient's last menstrual period was 06/23/2015.  Subjective:   She presents today approximately 2 weeks from surgery.  She complains of new onset right-sided pelvic pulling pain especially when standing.  The pain is not disabling but as she describes it it seems more than normal postop pain. Patient also complains of a vaginal discharge.  She says she has some itching with the discharge.  She denies vaginal bleeding.  Denies fever or chills. She says she is urinating without difficulty and does not think she is leaking.  She reports voiding every 5-6 hours.    Hx: The following portions of the patient's history were reviewed and updated as appropriate:             She  has a past medical history of Allergy, Anxiety, Cervical cancer ([place]) (2011), Chronic headaches, Depression, Frequent falls, GERD (gastroesophageal reflux disease), and Lumbar radiculopathy. She does not have any pertinent problems on file. She  has a past surgical history that includes Foot surgery (Right); Tubal ligation; Colposcopy (2011 or 2012); Parotidectomy (Right, 10/15/2019); Excision of tongue lesion (N/A, 10/15/2019); Laparoscopic vaginal hysterectomy with salpingo oophorectomy (Bilateral, 04/12/2020); Bladder suspension (N/A, 04/12/2020); and Anterior and posterior repair (N/A, 04/12/2020). Her family history includes Brain cancer in her maternal grandfather; Breast cancer in her paternal aunt; Cervical cancer in her maternal grandmother and sister; Deafness in her daughter; Hypertension in her mother; Kidney disease in her father; Lung cancer in her maternal grandfather; Thyroid disease in her daughter; Uterine cancer in her sister. She  reports that she quit smoking about 7 months ago. Her smoking use included cigarettes. She has a 5.00 pack-year smoking history. She has never used smokeless tobacco. She reports previous drug use. Frequency: 1.00 time per  week. Drug: Marijuana. She reports that she does not drink alcohol. She has a current medication list which includes the following prescription(s): amitriptyline, buspirone, cyclobenzaprine, estradiol, fluoxetine hcl, gabapentin, hydrocortisone acetate, hydroxyzine, multivitamin with minerals, nystatin, oxycodone-acetaminophen, sertraline, valacyclovir, [DISCONTINUED] cyclobenzaprine, [DISCONTINUED] fluoxetine, and [DISCONTINUED] hydroxyzine. She is allergic to amoxicillin and penicillins.       Review of Systems:  Review of Systems  Constitutional: Denied constitutional symptoms, night sweats, recent illness, fatigue, fever, insomnia and weight loss.  Eyes: Denied eye symptoms, eye pain, photophobia, vision change and visual disturbance.  Ears/Nose/Throat/Neck: Denied ear, nose, throat or neck symptoms, hearing loss, nasal discharge, sinus congestion and sore throat.  Cardiovascular: Denied cardiovascular symptoms, arrhythmia, chest pain/pressure, edema, exercise intolerance, orthopnea and palpitations.  Respiratory: Denied pulmonary symptoms, asthma, pleuritic pain, productive sputum, cough, dyspnea and wheezing.  Gastrointestinal: Denied, gastro-esophageal reflux, melena, nausea and vomiting.  Genitourinary: Denied genitourinary symptoms including symptomatic vaginal discharge, pelvic relaxation issues, and urinary complaints.  Musculoskeletal: Denied musculoskeletal symptoms, stiffness, swelling, muscle weakness and myalgia.  Dermatologic: Denied dermatology symptoms, rash and scar.  Neurologic: Denied neurology symptoms, dizziness, headache, neck pain and syncope.  Psychiatric: Denied psychiatric symptoms, anxiety and depression.  Endocrine: Denied endocrine symptoms including hot flashes and night sweats.   Meds:   Current Outpatient Medications on File Prior to Visit  Medication Sig Dispense Refill  . amitriptyline (ELAVIL) 25 MG tablet TAKE 1-2 TABLETS (25-50 MG TOTAL) BY MOUTH AT  BEDTIME. (Patient taking differently: Take 50 mg by mouth at bedtime.) 180 tablet 0  . busPIRone (BUSPAR) 10 MG tablet Take 1 tablet (10 mg total) by mouth 2 (two) times daily. 60 tablet  1  . cyclobenzaprine (FLEXERIL) 10 MG tablet TAKE 1/2 TO 1 TABLETS BY MOUTH AT BEDTIME. (Patient taking differently: Take 10 mg by mouth 2 (two) times daily.) 30 tablet 1  . estradiol (ESTRACE) 1 MG tablet Take 1 tablet (1 mg total) by mouth daily. 50 tablet 1  . FLUoxetine HCl 60 MG TABS Take 60 mg by mouth daily. 90 tablet 3  . gabapentin (NEURONTIN) 300 MG capsule Take 300 mg by mouth 3 (three) times daily.    . Hydrocortisone Acetate 1 % CREA Apply 1 application topically daily as needed (heat rash).    . hydrOXYzine (ATARAX/VISTARIL) 25 MG tablet Take 1 tablet (25 mg total) by mouth every 8 (eight) hours as needed. (Patient taking differently: Take 25 mg by mouth 2 (two) times daily.) 60 tablet 3  . Multiple Vitamin (MULTIVITAMIN WITH MINERALS) TABS tablet Take 1 tablet by mouth daily.    [name] nystatin (MYCOSTATIN/NYSTOP) powder APPLY TOPICALLY 3 (THREE) TIMES DAILY. (Patient taking differently: Apply 1 application topically 3 (three) times daily as needed (irritation).) 15 g 0  . oxyCODONE-acetaminophen (PERCOCET/ROXICET) 5-325 MG tablet Take 1-2 tablets by mouth every 4 (four) hours as needed for moderate pain. 15 tablet 0  . sertraline (ZOLOFT) 50 MG tablet Take 60 mg by mouth daily.    . valACYclovir (VALTREX) 500 MG tablet Take 1 tablet (500 mg total) by mouth daily. Can increase to twice a day for 5 days in the event of a recurrence (Patient taking differently: Take 500 mg by mouth See admin instructions. Take 500 mg daily. Can increase to twice a day for 5 days in the event of a recurrence) 30 tablet 12  . [DISCONTINUED] cyclobenzaprine (FLEXERIL) 10 MG tablet Take 5-10 mg by mouth at bedtime.    . [DISCONTINUED] FLUoxetine (PROZAC) 40 MG capsule Take 1 capsule (40 mg total) by mouth daily. 90 capsule 0  .  [DISCONTINUED] hydrOXYzine (ATARAX/VISTARIL) 25 MG tablet TAKE 1 TO 2 TABLETS BY MOUTH 3 TIMES A DAY AS NEEDED FOR ANXIETY 30 tablet 0   No current facility-administered medications on file prior to visit.          Objective:     Vitals:   04/28/20 1028  BP: 129/90  Pulse: 81   Filed Weights   04/28/20 1028  Weight: 217 lb 14.4 oz (98.8 kg)             Patient moving from sitting position to exam table and back down without difficulty.    Abdomen is soft nontender.  Incisions healing well.  Speculum examination reveals well-healing incisions in the vagina.  No bleeding noted.  Appearance of vaginal discharge seems consistent with normal postop discharge.   Assessment:    [id number] Patient Active Problem List   Diagnosis Date Noted  . Postoperative state 04/12/2020  . Other female genital prolapse   . Pelvic and perineal pain   . Stress incontinence   . Generalized anxiety disorder 02/03/2020  . Genital herpes simplex 01/12/2020  . 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 Moderate episode of recurrent major depressive disorder ([place]) 10/06/2019  . Candidiasis of skin 10/06/2019  . Tongue lesion 08/06/2019  . Tobacco use 08/06/2019  . Hx of cervical cancer 08/06/2019  . Right hip pain 08/06/2019  . Obesity (BMI 30.0-34.9) 08/06/2019  . Essential hypertension 08/06/2019  . Nodule of neck 08/06/2019     1. Pelvic pain in female   2. Vaginal discharge     Although patient describes the pain  as a significant her examination today does not seem out of line with normal postop pain.   Plan:            1.  Patient encouraged to void more frequently as previously instructed  2.  Urine sent for C&S  3.  Nuswab culture sent to rule out vaginitis  4.  Pelvic ultrasound ordered for later today. Orders Orders Placed This Encounter  Procedures  . US PELVIS (TRANSABDOMINAL ONLY)    No orders of the defined types were placed in this encounter.     F/U  Return for We will contact  her with any abnormal test results. I spent 22 minutes involved in the care of this patient preparing to see the patient by obtaining and reviewing her medical history (including labs, imaging tests and prior procedures), documenting clinical information in the electronic health record (EHR), counseling and coordinating care plans, writing and sending prescriptions, ordering tests or procedures and directly communicating with the patient by discussing pertinent items from her history and physical exam as well as detailing my assessment and plan as noted above so that she has an informed understanding.  All of her questions were answered.  Elonda Husky, M.D. 04/28/2020 10:48 AM

## 2020-04-29 ENCOUNTER — Other Ambulatory Visit: Payer: BC Managed Care – PPO

## 2020-04-29 LAB — CERVICOVAGINAL ANCILLARY ONLY
Bacterial Vaginitis (gardnerella): NEGATIVE
Candida Glabrata: NEGATIVE
Candida Vaginitis: NEGATIVE
Chlamydia: NEGATIVE
Comment: NEGATIVE
Comment: NEGATIVE
Comment: NEGATIVE
Comment: NEGATIVE
Comment: NEGATIVE
Comment: NORMAL
Neisseria Gonorrhea: NEGATIVE
Trichomonas: NEGATIVE

## 2020-04-30 LAB — URINE CULTURE

## 2020-05-03 ENCOUNTER — Other Ambulatory Visit: Payer: Self-pay | Admitting: Surgical

## 2020-05-03 ENCOUNTER — Other Ambulatory Visit: Payer: Self-pay

## 2020-05-03 MED ORDER — NITROFURANTOIN MONOHYD MACRO 100 MG PO CAPS: 100.0000 mg | ORAL_CAPSULE | Freq: Two times a day (BID) | ORAL | 0 refills | Status: DC

## 2020-05-04 ENCOUNTER — Other Ambulatory Visit: Payer: BC Managed Care – PPO

## 2020-05-05 ENCOUNTER — Encounter: Payer: Self-pay | Admitting: Family Medicine

## 2020-05-07 ENCOUNTER — Other Ambulatory Visit: Payer: Self-pay | Admitting: Family Medicine

## 2020-05-07 DIAGNOSIS — F410 Panic disorder [episodic paroxysmal anxiety] without agoraphobia: Secondary | ICD-10-CM

## 2020-05-10 NOTE — Telephone Encounter (Signed)
To PCP

## 2020-05-11 ENCOUNTER — Other Ambulatory Visit: Payer: BC Managed Care – PPO

## 2020-05-12 ENCOUNTER — Ambulatory Visit (INDEPENDENT_AMBULATORY_CARE_PROVIDER_SITE_OTHER): Payer: BC Managed Care – PPO

## 2020-05-12 ENCOUNTER — Other Ambulatory Visit: Payer: Self-pay

## 2020-05-12 DIAGNOSIS — R102 Pelvic and perineal pain: Secondary | ICD-10-CM

## 2020-05-13 ENCOUNTER — Telehealth: Payer: BC Managed Care – PPO | Admitting: Family Medicine

## 2020-05-13 ENCOUNTER — Encounter: Payer: Self-pay | Admitting: Family Medicine

## 2020-05-14 ENCOUNTER — Encounter: Payer: Self-pay | Admitting: Family Medicine

## 2020-05-21 ENCOUNTER — Telehealth: Payer: BC Managed Care – PPO | Admitting: Nurse Practitioner

## 2020-05-21 ENCOUNTER — Encounter: Payer: Self-pay | Admitting: Nurse Practitioner

## 2020-05-21 DIAGNOSIS — M25551 Pain in right hip: Secondary | ICD-10-CM

## 2020-05-21 DIAGNOSIS — J069 Acute upper respiratory infection, unspecified: Secondary | ICD-10-CM | POA: Diagnosis not present

## 2020-05-21 MED ORDER — PREDNISONE 10 MG PO TABS: ORAL_TABLET | ORAL | 0 refills | Status: AC

## 2020-05-21 MED ORDER — NOREL AD 4-10-325 MG PO TABS: 1.0000 | ORAL_TABLET | ORAL | 1 refills | Status: DC | PRN

## 2020-05-21 MED ORDER — AZITHROMYCIN 250 MG PO TABS: ORAL_TABLET | ORAL | 0 refills | Status: DC

## 2020-05-21 NOTE — Assessment & Plan Note (Signed)
-  per 08/06/19 note; had x-ray that was negative for severe arthritis -she already takes flexeril -Rx. Prednisone dose pack -we discussed that if this did not help she would need to see PCP and/or ortho

## 2020-05-21 NOTE — Assessment & Plan Note (Signed)
-  she has been symptomatic for a month and states she had a COVID test in Dec. 2021 -Rx. norel for congestion -Rx. z-pack -Rx. prednisone as below to help with fatigue

## 2020-05-21 NOTE — Progress Notes (Signed)
Acute Office Visit  Subjective:    Patient ID: Erin Dorsey, female    DOB: Jun 23, 1970, 50 y.o.   MRN: [id number]  Chief Complaint  Patient presents with  . Cough    X1 month  . Back Pain    chronic    HPI Patient is in today for sick visit.  Symptoms started about a month ago.  Symptoms per ROS.  She has been taking robitussin, but that did not help. She had a COVID test that was negative, and that was completed in Dec 2021.  She is also complaining of low back and hip pain that is moderate to severe. Described further in ROS.  Past Medical History:  Diagnosis Date  . Allergy   . Anxiety   . Cervical cancer ([place]) 2011   stage 3  . Chronic headaches   . Depression   . Frequent falls   . GERD (gastroesophageal reflux disease)   . Lumbar radiculopathy     Past Surgical History:  Procedure Laterality Date  . ANTERIOR AND POSTERIOR REPAIR N/A 04/12/2020   Procedure: ANTERIOR (CYSTOCELE) AND POSTERIOR REPAIR (RECTOCELE);  Surgeon: Harlin Heys, MD;  Location: [facility] ORS;  Service: Gynecology;  Laterality: N/A;  . BLADDER SUSPENSION N/A 04/12/2020   Procedure: [facility] PROCEDURE;  Surgeon: Harlin Heys, MD;  Location: [facility] ORS;  Service: Gynecology;  Laterality: N/A;  TOT  . COLPOSCOPY  2011 or 2012   stage 3-removed cancerous spot on the cervix  . EXCISION OF TONGUE LESION N/A 10/15/2019   Procedure: EXCISION OF TONGUE LESION;  Surgeon: Clyde Canterbury, MD;  Location: [facility] ORS;  Service: ENT;  Laterality: N/A;  . FOOT SURGERY Right    x 2-took knots out from bottom of the foot-not sure of the technical name  . LAPAROSCOPIC VAGINAL HYSTERECTOMY WITH SALPINGO OOPHORECTOMY Bilateral 04/12/2020   Procedure: LAPAROSCOPIC ASSISTED VAGINAL HYSTERECTOMY WITH SALPINGO OOPHORECTOMY;  Surgeon: Harlin Heys, MD;  Location: [facility] ORS;  Service: Gynecology;  Laterality: Bilateral;  . PAROTIDECTOMY Right 10/15/2019   Procedure: PAROTIDECTOMY;  Surgeon: Clyde Canterbury, MD;   Location: [facility] ORS;  Service: ENT;  Laterality: Right;  . TUBAL LIGATION      Family History  Problem Relation Age of Onset  . Hypertension Mother   . Kidney disease Father        was on dialysis  . Uterine cancer Sister   . Cervical cancer Sister   . Deafness Daughter   . Cervical cancer Maternal Grandmother   . Lung cancer Maternal Grandfather   . Brain cancer Maternal Grandfather   . Thyroid disease Daughter   . Breast cancer Paternal Aunt     Social History   Socioeconomic History  . Marital status: Married    Spouse name: [name]  . Number of children: 3  . Years of education: 7th grade  . Highest education level: Not on file  Occupational History  . Not on file  Tobacco Use  . Smoking status: Former Smoker    Packs/day: 0.25    Years: 20.00    Pack years: 5.00    Types: Cigarettes    Quit date: 09/01/2019    Years since quitting: 0.7  . Smokeless tobacco: Never Used  . Tobacco comment: not daily  Vaping Use  . Vaping Use: Never used  Substance and Sexual Activity  . 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 Drug use: Not Currently    Frequency: 1.0 times per  week    Types: Marijuana  . Sexual activity: Not Currently    Birth control/protection: Surgical    Comment: tubal ligation  Other Topics Concern  . Not on file  Social History Narrative   08/06/19   From: [place], came here to be close to daughter   Living: with husband [name] together since 2011 and married since 2021   Work: not currently, use to work at [employer]: 3 daughter - in [place] - ok relationship - 1 grandchild / with [name] has 18 step grandchildren      Enjoys: walk, watch movies, painting, cross stitch      Exercise: walking   Diet: eats whatever      Safety   Seat belts: Yes    Guns: No   Safe in relationships: Yes    Social Determinants of Radio broadcast assistant Strain: Not on file  Food Insecurity: Not on file  Transportation Needs: Not on file   Physical Activity: Not on file  Stress: Not on file  Social Connections: Not on file  Intimate Partner Violence: Not on file    Outpatient Medications Prior to Visit  Medication Sig Dispense Refill  . amitriptyline (ELAVIL) 25 MG tablet TAKE 1-2 TABLETS (25-50 MG TOTAL) BY MOUTH AT BEDTIME. 180 tablet 0  . cyclobenzaprine (FLEXERIL) 10 MG tablet TAKE 1/2 TO 1 TABLETS BY MOUTH AT BEDTIME. 30 tablet 1  . hydrOXYzine (ATARAX/VISTARIL) 25 MG tablet TAKE 1 TABLET BY MOUTH EVERY 8 HOURS AS NEEDED. 60 tablet 3  . busPIRone (BUSPAR) 10 MG tablet Take 1 tablet (10 mg total) by mouth 2 (two) times daily. 60 tablet 1  . estradiol (ESTRACE) 1 MG tablet Take 1 tablet (1 mg total) by mouth daily. 50 tablet 1  . FLUoxetine HCl 60 MG TABS Take 60 mg by mouth daily. 90 tablet 3  . gabapentin (NEURONTIN) 300 MG capsule Take 300 mg by mouth 3 (three) times daily.    . Hydrocortisone Acetate 1 % CREA Apply 1 application topically daily as needed (heat rash).    . Multiple Vitamin (MULTIVITAMIN WITH MINERALS) TABS tablet Take 1 tablet by mouth daily.    . nitrofurantoin, macrocrystal-monohydrate, (MACROBID) 100 MG capsule Take 1 capsule (100 mg total) by mouth 2 (two) times daily. 14 capsule 0  . sertraline (ZOLOFT) 50 MG tablet Take 60 mg by mouth daily.    . valACYclovir (VALTREX) 500 MG tablet Take 1 tablet (500 mg total) by mouth daily. Can increase to twice a day for 5 days in the event of a recurrence (Patient taking differently: Take 500 mg by mouth See admin instructions. Take 500 mg daily. Can increase to twice a day for 5 days in the event of a recurrence) 30 tablet 12  . nystatin (MYCOSTATIN/NYSTOP) powder APPLY TOPICALLY 3 (THREE) TIMES DAILY. (Patient taking differently: Apply 1 application topically 3 (three) times daily as needed (irritation).) 15 g 0  . oxyCODONE-acetaminophen (PERCOCET/ROXICET) 5-325 MG tablet Take 1-2 tablets by mouth every 4 (four) hours as needed for moderate pain. 15 tablet 0    No facility-administered medications prior to visit.    Allergies  Allergen Reactions  . Amoxicillin Rash  . Penicillins Rash    Review of Systems  Constitutional: Positive for fatigue. Negative for chills and fever.  HENT: Positive for sinus pressure, sinus pain and sneezing. Negative for congestion, rhinorrhea and sore throat.   Respiratory: Positive for cough. Negative for chest tightness, shortness  of breath and wheezing.   Neurological: Positive for numbness.       She has bilateral hip hip pain with R worse than left, and she states her pain radiates into her right lower leg and back       Objective:    Physical Exam  LMP 06/23/2015  Wt Readings from Last 3 Encounters:  04/28/20 217 lb 14.4 oz (98.8 kg)  04/20/20 220 lb 1.6 oz (99.8 kg)  04/12/20 230 lb (104.3 kg)    Health Maintenance Due  Topic Date Due  . Hepatitis C Screening  Never done  . COVID-19 Vaccine (1) Never done  . HIV Screening  Never done  . COLONOSCOPY (Pts 45-32yrs Insurance coverage will need to be confirmed)  Never done    There are no preventive care reminders to display for this patient.   No results found for: TSH Lab Results  Component Value Date   WBC 4.6 04/09/2020   HGB 13.4 04/09/2020   HCT 39.3 04/09/2020   MCV 101.8 (H) 04/09/2020   PLT 207 04/09/2020   Lab Results  Component Value Date   NA 143 08/06/2019   K 3.5 08/06/2019   CO2 25 08/06/2019   GLUCOSE 74 08/06/2019   BUN 6 08/06/2019   CREATININE 0.70 08/06/2019   BILITOT 0.5 08/06/2019   ALKPHOS 32 (L) 08/06/2019   AST 18 08/06/2019   ALT 16 08/06/2019   PROT 7.2 08/06/2019   ALBUMIN 4.2 08/06/2019   CALCIUM 9.1 08/06/2019   ANIONGAP 7 07/22/2015   GFR 89.68 08/06/2019   Lab Results  Component Value Date   CHOL 149 08/06/2019   Lab Results  Component Value Date   HDL 33.10 (L) 08/06/2019   Lab Results  Component Value Date   LDLCALC 94 08/06/2019   Lab Results  Component Value Date   TRIG  113.0 08/06/2019   Lab Results  Component Value Date   CHOLHDL 5 08/06/2019   Lab Results  Component Value Date   HGBA1C 5.4 08/06/2019       Assessment & Plan:   Problem List Items Addressed This Visit   None      Meds ordered this encounter  Medications  . predniSONE (DELTASONE) 10 MG tablet    Sig: Take 6 tablets (60 mg total) by mouth daily with breakfast for 1 day, THEN 5 tablets (50 mg total) daily with breakfast for 1 day, THEN 4 tablets (40 mg total) daily with breakfast for 1 day, THEN 3 tablets (30 mg total) daily with breakfast for 1 day, THEN 2 tablets (20 mg total) daily with breakfast for 1 day, THEN 1 tablet (10 mg total) daily with breakfast for 1 day.    Dispense:  21 tablet    Refill:  0  . azithromycin (ZITHROMAX) 250 MG tablet    Sig: Take as directed    Dispense:  6 tablet    Refill:  0    Please dispense as a z-pack  . Chlorphen-PE-Acetaminophen (NOREL AD) 4-10-325 MG TABS    Sig: Take 1 tablet by mouth every 4 (four) hours as needed (nasal congestion, cold symptoms).    Dispense:  20 tablet    Refill:  1   Date:  05/21/2020   Location of Patient: Home Location of Provider: Office Consent was obtain for visit to be over via telehealth. I verified that I am speaking with the correct person using two identifiers.  I connected with  Hermenia Bers on 05/21/20  via telephone and verified that I am speaking with the correct person using two identifiers.   I discussed the limitations of evaluation and management by telemedicine. The patient expressed understanding and agreed to proceed.  Time spent: 19 minutes   Noreene Larsson, NP

## 2020-05-22 ENCOUNTER — Other Ambulatory Visit: Payer: Self-pay | Admitting: Family Medicine

## 2020-05-22 DIAGNOSIS — F411 Generalized anxiety disorder: Secondary | ICD-10-CM

## 2020-05-22 DIAGNOSIS — F331 Major depressive disorder, recurrent, moderate: Secondary | ICD-10-CM

## 2020-05-24 ENCOUNTER — Other Ambulatory Visit: Payer: Self-pay | Admitting: Certified Nurse Midwife

## 2020-05-26 ENCOUNTER — Encounter: Payer: Self-pay | Admitting: Obstetrics and Gynecology

## 2020-05-26 ENCOUNTER — Ambulatory Visit (INDEPENDENT_AMBULATORY_CARE_PROVIDER_SITE_OTHER): Payer: BC Managed Care – PPO | Admitting: Obstetrics and Gynecology

## 2020-05-26 ENCOUNTER — Other Ambulatory Visit: Payer: Self-pay

## 2020-05-26 VITALS — BP 115/78 | HR 88 | Ht 68.0 in | Wt 222.3 lb

## 2020-05-26 DIAGNOSIS — N393 Stress incontinence (female) (male): Secondary | ICD-10-CM

## 2020-05-26 DIAGNOSIS — R102 Pelvic and perineal pain: Secondary | ICD-10-CM | POA: Diagnosis not present

## 2020-05-26 DIAGNOSIS — Z79890 Hormone replacement therapy (postmenopausal): Secondary | ICD-10-CM

## 2020-05-26 DIAGNOSIS — Z9889 Other specified postprocedural states: Secondary | ICD-10-CM

## 2020-05-26 MED ORDER — ESTRADIOL 1 MG PO TABS: 1.5000 mg | ORAL_TABLET | Freq: Every day | ORAL | 2 refills | Status: DC

## 2020-05-26 NOTE — Telephone Encounter (Signed)
Pharmacy requests refill on: Buspirone 5 mg   LAST REFILL: 04/09/2020 (Q-60, R-1) LAST OV: 02/03/2020  NEXT OV: Not Scheduled  PHARMACY: [id number] [place], [place] requests refill on: Fluoxetine 40 mg   LAST REFILL: 02/03/2020 (Q-90, R-3)  LAST OV: 02/03/2020 NEXT OV: Not Scheduled  PHARMACY: [id number] [place], [place]  *TOO EARLY FOR REFILL*

## 2020-05-26 NOTE — Progress Notes (Signed)
HPI:      [name] is a 50 y.o. [phone] who LMP was Patient's last menstrual period was 06/23/2015.  Subjective:   She presents today approximately 6 weeks from surgery.  LAVH BSO A&P repair TOT.  She had previous uterine fibroids and significant pelvic relaxation with stress urinary incontinence. She states that today her pain is gone and she feels much better.  She also states that her urine loss is much improved however she still occasionally leaks a small amount of urine with coughing laughing and sneezing.  She says this is not related to bladder fullness.  Of significant note patient did complete her course of Macrobid for postop urinary tract infection. She has not yet resumed intercourse.  She denies vaginal bleeding. She is taking estradiol as directed, 1 mg, she states that she still has occasional hot flashes.    Hx: The following portions of the patient's history were reviewed and updated as appropriate:             She  has a past medical history of Allergy, Anxiety, Cervical cancer ([place]) (2011), Chronic headaches, Depression, Frequent falls, GERD (gastroesophageal reflux disease), and Lumbar radiculopathy. She does not have any pertinent problems on file. She  has a past surgical history that includes Foot surgery (Right); Tubal ligation; Colposcopy (2011 or 2012); Parotidectomy (Right, 10/15/2019); Excision of tongue lesion (N/A, 10/15/2019); Laparoscopic vaginal hysterectomy with salpingo oophorectomy (Bilateral, 04/12/2020); Bladder suspension (N/A, 04/12/2020); and Anterior and posterior repair (N/A, 04/12/2020). Her family history includes Brain cancer in her maternal grandfather; Breast cancer in her paternal aunt; Cervical cancer in her maternal grandmother and sister; Deafness in her daughter; Hypertension in her mother; Kidney disease in her father; Lung cancer in her maternal grandfather; Thyroid disease in her daughter; Uterine cancer in her sister. She  reports  that she quit smoking about 8 months ago. Her smoking use included cigarettes. She has a 5.00 pack-year smoking history. She has never used smokeless tobacco. She reports previous drug use. Frequency: 1.00 time per week. Drug: Marijuana. She reports that she does not drink alcohol. She has a current medication list which includes the following prescription(s): amitriptyline, buspirone, norel ad, cyclobenzaprine, estradiol, fluoxetine hcl, gabapentin, hydrocortisone acetate, hydroxyzine, multivitamin with minerals, nitrofurantoin (macrocrystal-monohydrate), nystatin, prednisone, sertraline, valacyclovir, and azithromycin. She is allergic to amoxicillin and penicillins.       Review of Systems:  Review of Systems  Constitutional: Denied constitutional symptoms, night sweats, recent illness, fatigue, fever, insomnia and weight loss.  Eyes: Denied eye symptoms, eye pain, photophobia, vision change and visual disturbance.  Ears/Nose/Throat/Neck: Denied ear, nose, throat or neck symptoms, hearing loss, nasal discharge, sinus congestion and sore throat.  Cardiovascular: Denied cardiovascular symptoms, arrhythmia, chest pain/pressure, edema, exercise intolerance, orthopnea and palpitations.  Respiratory: Denied pulmonary symptoms, asthma, pleuritic pain, productive sputum, cough, dyspnea and wheezing.  Gastrointestinal: Denied, gastro-esophageal reflux, melena, nausea and vomiting.  Genitourinary: Denied genitourinary symptoms including symptomatic vaginal discharge, pelvic relaxation issues, and urinary complaints.  Musculoskeletal: Denied musculoskeletal symptoms, stiffness, swelling, muscle weakness and myalgia.  Dermatologic: Denied dermatology symptoms, rash and scar.  Neurologic: Denied neurology symptoms, dizziness, headache, neck pain and syncope.  Psychiatric: Denied psychiatric symptoms, anxiety and depression.  Endocrine: Denied endocrine symptoms including hot flashes and night sweats.    Meds:   Current Outpatient Medications on File Prior to Visit  Medication Sig Dispense Refill  . amitriptyline (ELAVIL) 25 MG tablet TAKE 1-2 TABLETS (25-50 MG TOTAL) BY MOUTH AT BEDTIME. [place]  tablet 0  . busPIRone (BUSPAR) 10 MG tablet Take 1 tablet (10 mg total) by mouth 2 (two) times daily. 60 tablet 1  . Chlorphen-PE-Acetaminophen (NOREL AD) 4-10-325 MG TABS Take 1 tablet by mouth every 4 (four) hours as needed (nasal congestion, cold symptoms). 20 tablet 1  . cyclobenzaprine (FLEXERIL) 10 MG tablet TAKE 1/2 TO 1 TABLETS BY MOUTH AT BEDTIME. 30 tablet 1  . estradiol (ESTRACE) 1 MG tablet Take 1 tablet (1 mg total) by mouth daily. 50 tablet 1  . FLUoxetine HCl 60 MG TABS Take 60 mg by mouth daily. 90 tablet 3  . gabapentin (NEURONTIN) 300 MG capsule Take 300 mg by mouth 3 (three) times daily.    . Hydrocortisone Acetate 1 % CREA Apply 1 application topically daily as needed (heat rash).    . hydrOXYzine (ATARAX/VISTARIL) 25 MG tablet TAKE 1 TABLET BY MOUTH EVERY 8 HOURS AS NEEDED. 60 tablet 3  . Multiple Vitamin (MULTIVITAMIN WITH MINERALS) TABS tablet Take 1 tablet by mouth daily.    . nitrofurantoin, macrocrystal-monohydrate, (MACROBID) 100 MG capsule Take 1 capsule (100 mg total) by mouth 2 (two) times daily. 14 capsule 0  . nystatin (MYCOSTATIN/NYSTOP) powder APPLY TOPICALLY 3 (THREE) TIMES DAILY. 15 g 0  . predniSONE (DELTASONE) 10 MG tablet Take 6 tablets (60 mg total) by mouth daily with breakfast for 1 day, THEN 5 tablets (50 mg total) daily with breakfast for 1 day, THEN 4 tablets (40 mg total) daily with breakfast for 1 day, THEN 3 tablets (30 mg total) daily with breakfast for 1 day, THEN 2 tablets (20 mg total) daily with breakfast for 1 day, THEN 1 tablet (10 mg total) daily with breakfast for 1 day. 21 tablet 0  . sertraline (ZOLOFT) 50 MG tablet Take 60 mg by mouth daily.    . valACYclovir (VALTREX) 500 MG tablet Take 1 tablet (500 mg total) by mouth daily. Can increase to  twice a day for 5 days in the event of a recurrence (Patient taking differently: Take 500 mg by mouth See admin instructions. Take 500 mg daily. Can increase to twice a day for 5 days in the event of a recurrence) 30 tablet 12  . azithromycin (ZITHROMAX) 250 MG tablet Take as directed (Patient not taking: Reported on 05/26/2020) 6 tablet 0   No current facility-administered medications on file prior to visit.          Objective:     Vitals:   05/26/20 1541  BP: 115/78  Pulse: 88   Filed Weights   05/26/20 1541  Weight: 222 lb 4.8 oz (100.8 kg)      Abdomen: Soft.  Non-tender.  No masses.  No HSM.  Incision/s: Intact.  Healing well.  No erythema.  No drainage.    Pelvic:   Vulva: Normal appearance.  No lesions.  Vagina: No lesions or abnormalities noted.  Support: Normal pelvic support.  Urethra No masses tenderness or scarring.  Meatus Normal size without lesions or prolapse  Vag Cuff: Intact.  No lesions.  Anus: Normal exam.  No lesions.  Perineum: Normal exam.  No lesions.        Bimanual   Adnexae: No masses.  Non-tender to palpation.  Cuff: Negative for abnormality.      Assessment:    [id number] Patient Active Problem List   Diagnosis Date Noted  . URI (upper respiratory infection) 05/21/2020  . Postoperative state 04/12/2020  . Other female genital prolapse   . Pelvic and  perineal pain   . Stress incontinence   . Generalized anxiety disorder 02/03/2020  . Genital herpes simplex 01/12/2020  . 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 Moderate episode of recurrent major depressive disorder ([place]) 10/06/2019  . Candidiasis of skin 10/06/2019  . Tongue lesion 08/06/2019  . Tobacco use 08/06/2019  . Hx of cervical cancer 08/06/2019  . Right hip pain 08/06/2019  . Obesity (BMI 30.0-34.9) 08/06/2019  . Essential hypertension 08/06/2019  . Nodule of neck 08/06/2019     1. Post-operative state   2. Stress incontinence of urine   3. Postmenopausal hormone therapy      Patient still has a small amount of urine leakage but she reports this is much improved.  Pelvic pain resolved.  Occasional hot flashes.   Plan:            1.  Patient may resume intercourse in 2 weeks.  Advised lubrication.  2.  Urine sent for C&S to rule out persistent urinary tract infection.  3.  Increase estradiol to 1.5 tabs per day. Orders Orders Placed This Encounter  Procedures  . Urine Culture    No orders of the defined types were placed in this encounter.     F/U  Return in about 3 months (around 08/24/2020).  Finis Bud, M.D. 05/26/2020 4:49 PM

## 2020-05-26 NOTE — Telephone Encounter (Signed)
Her dose was increased to 10 mg.   Please clarify what dose she is currently taking. Happy to refill, but she is also overdue for follow-up.

## 2020-05-28 ENCOUNTER — Other Ambulatory Visit: Payer: Self-pay | Admitting: Nurse Practitioner

## 2020-05-28 LAB — URINE CULTURE

## 2020-06-02 DIAGNOSIS — M25551 Pain in right hip: Secondary | ICD-10-CM | POA: Diagnosis not present

## 2020-06-02 NOTE — Telephone Encounter (Signed)
Called patient to schedule follow up. Unable to reach or LVM. Will attempt again later.

## 2020-06-02 NOTE — Telephone Encounter (Signed)
Viewed schedule and pt scheduled.

## 2020-06-03 ENCOUNTER — Ambulatory Visit: Payer: BC Managed Care – PPO | Admitting: Family Medicine

## 2020-06-07 ENCOUNTER — Other Ambulatory Visit: Payer: Self-pay | Admitting: Family Medicine

## 2020-06-07 ENCOUNTER — Other Ambulatory Visit: Payer: Self-pay

## 2020-06-07 ENCOUNTER — Encounter: Payer: Self-pay | Admitting: Family Medicine

## 2020-06-07 ENCOUNTER — Ambulatory Visit (INDEPENDENT_AMBULATORY_CARE_PROVIDER_SITE_OTHER): Payer: BC Managed Care – PPO | Admitting: Family Medicine

## 2020-06-07 DIAGNOSIS — F411 Generalized anxiety disorder: Secondary | ICD-10-CM

## 2020-06-07 DIAGNOSIS — Z20822 Contact with and (suspected) exposure to covid-19: Secondary | ICD-10-CM | POA: Diagnosis not present

## 2020-06-07 DIAGNOSIS — F331 Major depressive disorder, recurrent, moderate: Secondary | ICD-10-CM

## 2020-06-07 MED ORDER — BUSPIRONE HCL 15 MG PO TABS: 15.0000 mg | ORAL_TABLET | Freq: Two times a day (BID) | ORAL | 1 refills | Status: DC

## 2020-06-07 NOTE — Progress Notes (Signed)
Virtual Visit via Telephone Note  I connected with Erin Dorsey on 06/07/20 at  3:00 PM EST by telephone and verified that I am speaking with the correct person using two identifiers.   I discussed the limitations, risks, security and privacy concerns of performing an evaluation and management service by telephone and the availability of in person appointments. I also discussed with the patient that there may be a patient responsible charge related to this service. The patient expressed understanding and agreed to proceed.  Patient location: Home Provider Location: [place] Participants: [name] and Erin Dorsey   History of Present Illness: Chief Complaint  Patient presents with  . Cough  . Nasal Congestion  . Headache  . Fatigue    Cough This is a chronic problem. The cough is non-productive. Associated symptoms include chills, ear congestion, ear pain, a fever, headaches, myalgias, nasal congestion, rhinorrhea, a sore throat and wheezing. Pertinent negatives include no shortness of breath.  Headache  Associated symptoms include coughing, ear pain, a fever, rhinorrhea and a sore throat.   Cough started 1 month Body aches and congestion started 1 week ago - 1 week   Treatment: mucinex w/o improvement, has not tried saline or flonase, allergy pill called in but not covered by insurance  No loss of taste or smell No sick contact  05/21/2020: Azithromycin and prednisone x 5 days - could not tell the difference with this treatment   Review of Systems  Constitutional: Positive for chills and fever.  HENT: Positive for ear pain, rhinorrhea and sore throat.   Respiratory: Positive for cough and wheezing. Negative for shortness of breath.   Musculoskeletal: Positive for myalgias.  Neurological: Positive for headaches.      Observations/Objective: LMP 06/23/2015   Phone visit:  Patient speaking in complete sentences No distress Alert and  oriented Normal mood  Assessment and Plan: Problem List Items Addressed This Visit      Other   Moderate episode of recurrent major depressive disorder ([place])    She notes some improvement on fluoxetine but still with depression and anxiety. Cont fluoxetine 60 mg daily. Increase Buspar 10 mg>15 mg twice daily. Return in 6 weeks      Relevant Medications   busPIRone (BUSPAR) 15 MG tablet   Generalized anxiety disorder - Primary   Relevant Medications   busPIRone (BUSPAR) 15 MG tablet    Other Visit Diagnoses    Suspected COVID-19 virus infection         Discussed OTC treatment for viral illness Patient tested today Instructed to isolate until the results come back  If negative and symptoms persisting will call back for consideration for course of antibiotics   Follow Up Instructions:  Return in about 6 weeks (around 07/19/2020).   I discussed the assessment and treatment plan with the patient. The patient was provided an opportunity to ask questions and all were answered. The patient agreed with the plan and demonstrated an understanding of the instructions.   The patient was advised to call back or seek an in-person evaluation if the symptoms worsen or if the condition fails to improve as anticipated.  I provided 10 minutes of non-face-to-face time during this encounter.  [name], MD

## 2020-06-07 NOTE — Assessment & Plan Note (Signed)
She notes some improvement on fluoxetine but still with depression and anxiety. Cont fluoxetine 60 mg daily. Increase Buspar 10 mg>15 mg twice daily. Return in 6 weeks

## 2020-06-07 NOTE — Patient Instructions (Signed)
Continue fluoxetine for depression Increase buspar to 15 mg twice daily -- sent the pharmacy   Based on your symptoms, it looks like you have a virus.   Antibiotics are not need for a viral infection but the following will help:   1. Drink plenty of fluids 2. Get lots of rest  Sinus Congestion 1) Neti Pot (Saline rinse) -- 2 times day -- if tolerated 2) Flonase (Store Brand ok) - once daily 3) Over the counter congestion medications  Cough 1) Cough drops can be helpful 2) Nyquil (or nighttime cough medication) 3) Honey is proven to be one of the best cough medications  4) Cough medicine with Dextromethorphan can also be helpful  Sore Throat 1) Honey as above, cough drops 2) Ibuprofen or Aleve can be helpful 3) Salt water Gargles  If you develop fevers (Temperature >100.4), chills, worsening symptoms or symptoms lasting longer than 10 days return to clinic.

## 2020-06-08 ENCOUNTER — Other Ambulatory Visit: Payer: Self-pay | Admitting: Family Medicine

## 2020-06-08 DIAGNOSIS — J0140 Acute pansinusitis, unspecified: Secondary | ICD-10-CM

## 2020-06-08 LAB — SARS-COV-2, NAA 2 DAY TAT

## 2020-06-08 LAB — NOVEL CORONAVIRUS, NAA: SARS-CoV-2, NAA: NOT DETECTED

## 2020-06-08 MED ORDER — DOXYCYCLINE HYCLATE 100 MG PO TABS
100.0000 mg | ORAL_TABLET | Freq: Two times a day (BID) | ORAL | 0 refills | Status: AC
Start: 2020-06-08 — End: 2020-06-15

## 2020-06-16 ENCOUNTER — Other Ambulatory Visit: Payer: Self-pay

## 2020-06-17 ENCOUNTER — Other Ambulatory Visit: Payer: Self-pay | Admitting: Family Medicine

## 2020-06-17 ENCOUNTER — Other Ambulatory Visit: Payer: Self-pay

## 2020-06-17 ENCOUNTER — Encounter: Payer: Self-pay | Admitting: Family Medicine

## 2020-06-17 DIAGNOSIS — F331 Major depressive disorder, recurrent, moderate: Secondary | ICD-10-CM

## 2020-06-17 MED ORDER — NYSTATIN 100000 UNIT/GM EX POWD: Freq: Two times a day (BID) | CUTANEOUS | 5 refills | Status: DC

## 2020-06-18 ENCOUNTER — Encounter: Payer: Self-pay | Admitting: Family Medicine

## 2020-06-30 ENCOUNTER — Other Ambulatory Visit: Payer: Self-pay | Admitting: Family Medicine

## 2020-06-30 DIAGNOSIS — Z1231 Encounter for screening mammogram for malignant neoplasm of breast: Secondary | ICD-10-CM

## 2020-07-03 ENCOUNTER — Other Ambulatory Visit: Payer: Self-pay | Admitting: Family Medicine

## 2020-07-03 DIAGNOSIS — F411 Generalized anxiety disorder: Secondary | ICD-10-CM

## 2020-07-03 DIAGNOSIS — F331 Major depressive disorder, recurrent, moderate: Secondary | ICD-10-CM

## 2020-07-04 ENCOUNTER — Other Ambulatory Visit: Payer: Self-pay | Admitting: Family Medicine

## 2020-07-04 ENCOUNTER — Telehealth: Payer: BC Managed Care – PPO | Admitting: Family

## 2020-07-04 ENCOUNTER — Telehealth: Payer: BC Managed Care – PPO | Admitting: Physician Assistant

## 2020-07-04 DIAGNOSIS — R200 Anesthesia of skin: Secondary | ICD-10-CM

## 2020-07-04 DIAGNOSIS — J0140 Acute pansinusitis, unspecified: Secondary | ICD-10-CM

## 2020-07-04 DIAGNOSIS — M549 Dorsalgia, unspecified: Secondary | ICD-10-CM

## 2020-07-04 NOTE — Progress Notes (Signed)
Based on what you shared with me, I feel your condition warrants further evaluation and I recommend that you be seen for a face to face office visit.  Given your symptoms you need to be evaluated face to face to rule out a more serious condition.    NOTE: If you entered your credit card information for this eVisit, you will not be charged. You may see a "hold" on your card for the $35 but that hold will drop off and you will not have a charge processed.   If you are having a true medical emergency please call 911.      For an urgent face to face visit, [place] has five urgent care centers for your convenience:     [facility] [place] [facility] [place] Get Driving Directions [phone] [place], [place] [address] . 10 am - 6pm Monday - Friday    [facility]) Get Driving Directions [phone] [address] [place], [place] [address] . 10 am to 8 pm Monday-Friday . 12 pm to 8 pm [facility] at [facility] Get Driving Directions [phone] [place], [place], [place] [address] . 8 am to 8 pm Monday-Friday . 9 am to 6 pm Saturday . 11 am to 6 pm Sunday     [facility] at [facility] Get Driving Directions  [phone] [address] [place], [place] [address] . 8 am to 8 pm Monday-Friday . 8 am to 4 pm [facility] at [place] Get Driving Directions [phone] [address] [place], [place] [address] . 12 pm to 6 pm Monday-Friday      Your e-visit answers were reviewed by a board certified advanced clinical practitioner to complete your personal care plan.  Thank you for using e-Visits.

## 2020-07-04 NOTE — Progress Notes (Signed)
Hi Erin Dorsey,   I'm sorry you aren't feeling well.  Unfortunately, this is too complex of a problem to take care of through a video visit.  Your back pain needs to be evaluated in person by a medical provider. You may need lab work and/or imaging - none of which can be performed through an E-visit or video visit.  Please contact your PCP or see below for Urgent Care locations.    Based on what you shared with me, I feel your condition warrants further evaluation and I recommend that you be seen for a face to face visit.  Please contact your primary care physician practice to be seen. Many offices offer virtual options to be seen via video if you are not comfortable going in person to a medical facility at this time.  If you do not have a PCP, [facility] offers a free physician referral service available at [phone]. Our trained staff has the experience, knowledge and resources to put you in touch with a physician who is right for you.   You also have the option of a video visit through [email or web address]  If you are having a true medical emergency please call 911.  NOTE: If you entered your credit card information for this eVisit, you will not be charged. You may see a "hold" on your card for the $35 but that hold will drop off and you will not have a charge processed.  Your e-visit answers were reviewed by a board certified advanced clinical practitioner to complete your personal care plan.  Thank you for using e-Visits.

## 2020-07-04 NOTE — Progress Notes (Signed)
Pt completed an Evisit and was told to be seen face to face to be evaluated in person.  Unable to connect to video. Will close out.   Evelina Dun, FNP

## 2020-07-09 DIAGNOSIS — M25511 Pain in right shoulder: Secondary | ICD-10-CM | POA: Diagnosis not present

## 2020-07-19 ENCOUNTER — Ambulatory Visit: Payer: BC Managed Care – PPO | Admitting: Family Medicine

## 2020-07-22 ENCOUNTER — Other Ambulatory Visit: Payer: Self-pay | Admitting: Obstetrics and Gynecology

## 2020-07-22 DIAGNOSIS — Z79890 Hormone replacement therapy (postmenopausal): Secondary | ICD-10-CM

## 2020-07-23 ENCOUNTER — Ambulatory Visit: Payer: BC Managed Care – PPO | Admitting: Family Medicine

## 2020-07-26 ENCOUNTER — Encounter: Payer: Self-pay | Admitting: Family Medicine

## 2020-08-10 ENCOUNTER — Ambulatory Visit: Payer: BC Managed Care – PPO | Admitting: Family Medicine

## 2020-08-13 DIAGNOSIS — Z1231 Encounter for screening mammogram for malignant neoplasm of breast: Secondary | ICD-10-CM

## 2020-08-17 ENCOUNTER — Other Ambulatory Visit: Payer: Self-pay | Admitting: Family Medicine

## 2020-08-17 ENCOUNTER — Other Ambulatory Visit: Payer: Self-pay

## 2020-08-17 ENCOUNTER — Ambulatory Visit (INDEPENDENT_AMBULATORY_CARE_PROVIDER_SITE_OTHER): Payer: BC Managed Care – PPO | Admitting: Family Medicine

## 2020-08-17 ENCOUNTER — Other Ambulatory Visit: Payer: Self-pay | Admitting: Obstetrics and Gynecology

## 2020-08-17 VITALS — BP 130/90 | HR 74 | Temp 96.9°F | Ht 68.0 in | Wt 228.5 lb

## 2020-08-17 DIAGNOSIS — F331 Major depressive disorder, recurrent, moderate: Secondary | ICD-10-CM | POA: Diagnosis not present

## 2020-08-17 DIAGNOSIS — J301 Allergic rhinitis due to pollen: Secondary | ICD-10-CM

## 2020-08-17 DIAGNOSIS — F411 Generalized anxiety disorder: Secondary | ICD-10-CM | POA: Diagnosis not present

## 2020-08-17 DIAGNOSIS — Z79890 Hormone replacement therapy (postmenopausal): Secondary | ICD-10-CM

## 2020-08-17 MED ORDER — CETIRIZINE HCL 10 MG PO TABS: 10.0000 mg | ORAL_TABLET | Freq: Every day | ORAL | 0 refills | Status: DC

## 2020-08-17 MED ORDER — AMITRIPTYLINE HCL 25 MG PO TABS: 25.0000 mg | ORAL_TABLET | Freq: Every day | ORAL | 1 refills | Status: DC

## 2020-08-17 NOTE — Patient Instructions (Addendum)
Allergy sinus symptoms - try daily allergy pill -- zyrtec, allegra, claritin (store brand OK) - can also try flonase if no improvement   Anxiety and Depression  1) Fluoxetine -- daily medication, continue 60 mg daily 2) Buspar - daily medication -- we will increase this medicine (see below) 3) Hydroxyzine -- As needed medication  ----> nighttime - trouble relaxing, high anxiety ----> before going out (may make you sleepy) in public ----> during a panic episode ---> SIDE EFFECTS: may cause SLEEPINESS  Buspar Dose increase guidelines  Week 1 and 2:  Morning 15 mg (full tablet) Lunchtime 7.5 mg (1/2 tablet) Nighttime 15 mg (full tablet)  After 2 weeks if no significant improvement in anxiety Start taking 15 mg three times a day  Send Mychart message in 4 weeks with update on anxiety   [employer] services only. Works off [place] in [place].   [place] location:   [facility] [phone]-  -Counseling/therapy (14 years and up- [employer] only)   -Psychiatry services most insurances accepted-treat and evaluate/diagnose patients and prescribe medications.  -Medication Management 48 years old to 71 years.  -Diagnoses treated: Depression/anxiety, ADHD, Substance Abuse, Bipolar Disorder, Psychotherapy, Pain Management, Spiritual Care Services.  [facility] (48 years of age and older) [phone]- Psychology services/therapy only.  [facility] [phone]- All ages-Just Therapy services   [facility] [phone]- [facility]-counseling only.   [facility] [phone]- All ages-Psychology and Psychiatrist services (evaluate, treat, diagnose, prescribe medication) Treat all the diagnoses that would fall under Behavioral issues.   For new patients, on Monday, Wednesday and Friday from 8 am to 3 pm patient would just walk in and fill out paperwork and they would get patient  enrolled in the services they need based on their answers.   [employer] [phone]- All ages. Monday through Friday-9am to 4 pm walk in times for patients to come in and be evaluated. Medication Management only at this time. No therapy available.   [place] location:   [facility] Address: [address], [place] [address] Phone: [phone] Counseling and psychiatry services.   [facility] and [place] locations)- [phone]- all ages, only therapy/counseling services/psychological evaluations. Services: aptitude testing, academic achievement testing, learning Disability evaluation, ADHD evaluations, psycho-educational evaluations, readiness for kindergarten Evaluations.   [place] location only has therapy services right now

## 2020-08-17 NOTE — Assessment & Plan Note (Signed)
Improving but persisting. Discussed therapy as next step. Will also increase buspar from 15 mg BID to up to 15 mg TID. Cont fluoxetine. Discussed using hydroxyzine prn for worsening symptoms

## 2020-08-17 NOTE — Assessment & Plan Note (Signed)
Significant improvement. Cont fluoxetine 60 mg. Buspar change per anxiety plan.

## 2020-08-17 NOTE — Telephone Encounter (Signed)
Refill request Amitriptyline Last refill 05/10/20 #180 Last office visit 06/07/20

## 2020-08-17 NOTE — Assessment & Plan Note (Signed)
Suspect sinus symptoms are allergies. Advised daily antihistamine.

## 2020-08-17 NOTE — Progress Notes (Signed)
Subjective:     Erin Dorsey is a 50 y.o. female presenting for Follow-up (Anxiety and depression )     HPI   #anxiety/depression - still feeling depressed - lack of motivation - anxiety and panic with being around people or in stores -   #insomnia - treating with amitriptyline at night - doing well 25 mg nightly  #sinus symptoms - runny nose - drainage in the back of the throat - swelling in the eyes - worse with pollen  - will also get headaches    Review of Systems  06/07/2020: Virtual visit - depression - cont fluoxetine, increase buspar 10>15 mg bid.   Social History   Tobacco Use  Smoking Status Former Smoker  . Packs/day: 0.25  . Years: 20.00  . Pack years: 5.00  . Types: Cigarettes  . Quit date: 09/01/2019  . Years since quitting: 0.9  Smokeless Tobacco Never Used  Tobacco Comment   not daily        Objective:    BP Readings from Last 3 Encounters:  08/17/20 130/90  05/26/20 115/78  04/28/20 129/90   Wt Readings from Last 3 Encounters:  08/17/20 228 lb 8 oz (103.6 kg)  05/26/20 222 lb 4.8 oz (100.8 kg)  04/28/20 217 lb 14.4 oz (98.8 kg)    BP 130/90   Pulse 74   Temp (!) 96.9 F (36.1 C) (Temporal)   Ht 5\' 8"  (1.727 m)   Wt 228 lb 8 oz (103.6 kg)   LMP 06/23/2015   SpO2 99%   BMI 34.74 kg/m    Physical Exam Constitutional:      General: She is not in acute distress.    Appearance: She is well-developed. She is not diaphoretic.  HENT:     Head: Normocephalic and atraumatic.     Right Ear: Tympanic membrane and ear canal normal.     Left Ear: Tympanic membrane and ear canal normal.     Nose: Mucosal edema and rhinorrhea present.     Right Sinus: No maxillary sinus tenderness or frontal sinus tenderness.     Left Sinus: No maxillary sinus tenderness or frontal sinus tenderness.     Mouth/Throat:     Pharynx: Uvula midline. Posterior oropharyngeal erythema present. No oropharyngeal exudate.     Tonsils: 0 on the right. 0 on  the left.  Eyes:     General: No scleral icterus.    Conjunctiva/sclera: Conjunctivae normal.  Cardiovascular:     Rate and Rhythm: Normal rate and regular rhythm.     Heart sounds: Normal heart sounds. No murmur heard.   Pulmonary:     Effort: Pulmonary effort is normal. No respiratory distress.     Breath sounds: Normal breath sounds.  Musculoskeletal:     Cervical back: Neck supple.  Lymphadenopathy:     Cervical: No cervical adenopathy.  Skin:    General: Skin is warm and dry.     Capillary Refill: Capillary refill takes less than 2 seconds.  Neurological:     Mental Status: She is alert.      Depression screen [facility] 2/9 08/17/2020 02/03/2020 10/06/2019  Decreased Interest 3 3 2   Down, Depressed, Hopeless 1 3 3   PHQ - 2 Score 4 6 5   Altered sleeping 3 3 3   Tired, decreased energy 3 3 3   Change in appetite 0 2 3  Feeling bad or failure about yourself  1 0 2  Trouble concentrating 0 3 3  Moving slowly or  fidgety/restless 0 3 2  Suicidal thoughts 0 0 0  PHQ-9 Score 11 20 21   Difficult doing work/chores Somewhat difficult Very difficult Somewhat difficult   GAD 7 : Generalized Anxiety Score 08/17/2020 02/03/2020 10/06/2019  Nervous, Anxious, on Edge 2 3 3   Control/stop worrying 3 3 3   Worry too much - different things 3 3 3   Trouble relaxing 3 3 3   Restless 0 3 3  Easily annoyed or irritable 3 2 3   Afraid - awful might happen 0 1 0  Total GAD 7 Score 14 18 18   Anxiety Difficulty Somewhat difficult Extremely difficult Very difficult         Assessment & Plan:   Problem List Items Addressed This Visit      Respiratory   Seasonal allergic rhinitis due to pollen    Suspect sinus symptoms are allergies. Advised daily antihistamine.       Relevant Medications   cetirizine (ZYRTEC) 10 MG tablet     Other   Moderate episode of recurrent major depressive disorder (HCC)    Significant improvement. Cont fluoxetine 60 mg. Buspar change per anxiety plan.        Generalized anxiety disorder - Primary    Improving but persisting. Discussed therapy as next step. Will also increase buspar from 15 mg BID to up to 15 mg TID. Cont fluoxetine. Discussed using hydroxyzine prn for worsening symptoms          Return in about 4 weeks (around 09/14/2020) for via mychat for anxiety.  [name], MD  This visit occurred during the SARS-CoV-2 public health emergency.  Safety protocols were in place, including screening questions prior to the visit, additional usage of staff PPE, and extensive cleaning of exam room while observing appropriate contact time as indicated for disinfecting solutions.

## 2020-08-23 DIAGNOSIS — M5416 Radiculopathy, lumbar region: Secondary | ICD-10-CM | POA: Diagnosis not present

## 2020-08-23 DIAGNOSIS — M545 Low back pain, unspecified: Secondary | ICD-10-CM | POA: Diagnosis not present

## 2020-08-25 ENCOUNTER — Other Ambulatory Visit: Payer: Self-pay

## 2020-08-25 ENCOUNTER — Ambulatory Visit (INDEPENDENT_AMBULATORY_CARE_PROVIDER_SITE_OTHER): Payer: BC Managed Care – PPO | Admitting: Obstetrics and Gynecology

## 2020-08-25 ENCOUNTER — Encounter: Payer: Self-pay | Admitting: Obstetrics and Gynecology

## 2020-08-25 VITALS — BP 127/78 | HR 76 | Ht 68.0 in | Wt 233.0 lb

## 2020-08-25 DIAGNOSIS — N3941 Urge incontinence: Secondary | ICD-10-CM

## 2020-08-25 DIAGNOSIS — B9689 Other specified bacterial agents as the cause of diseases classified elsewhere: Secondary | ICD-10-CM

## 2020-08-25 DIAGNOSIS — N760 Acute vaginitis: Secondary | ICD-10-CM

## 2020-08-25 DIAGNOSIS — N951 Menopausal and female climacteric states: Secondary | ICD-10-CM

## 2020-08-25 MED ORDER — METRONIDAZOLE 500 MG PO TABS: 500.0000 mg | ORAL_TABLET | Freq: Two times a day (BID) | ORAL | 0 refills | Status: AC

## 2020-08-25 MED ORDER — TOLTERODINE TARTRATE ER 2 MG PO CP24
2.0000 mg | ORAL_CAPSULE | Freq: Every day | ORAL | 2 refills | Status: DC
Start: 2020-08-25 — End: 2020-09-22

## 2020-08-25 MED ORDER — ESTRADIOL 2 MG PO TABS: 2.0000 mg | ORAL_TABLET | Freq: Every day | ORAL | 0 refills | Status: DC

## 2020-08-25 NOTE — Progress Notes (Signed)
HPI:      [name] is a 50 y.o. [phone] who LMP was Patient's last menstrual period was 06/23/2015.  Subjective:   She presents today as a follow-up to LAVH BSO TOT anterior repair that she had in December.  She continues to complain of hot flashes although since increasing her estrogen dose at her last visit she reports it is better but "not enough". She complains of a brownish vaginal discharge with odor. She also states that when she is on her way to the bathroom she has a sudden urge to urinate and often leaks urine at that time.  She reports that her urine leakage is significantly improved since her surgery but she is still wearing pads.    Hx: The following portions of the patient's history were reviewed and updated as appropriate:             She  has a past medical history of Allergy, Anxiety, Cervical cancer ([place]) (2011), Chronic headaches, Depression, Frequent falls, GERD (gastroesophageal reflux disease), and Lumbar radiculopathy. She does not have any pertinent problems on file. She  has a past surgical history that includes Foot surgery (Right); Tubal ligation; Colposcopy (2011 or 2012); Parotidectomy (Right, 10/15/2019); Excision of tongue lesion (N/A, 10/15/2019); Laparoscopic vaginal hysterectomy with salpingo oophorectomy (Bilateral, 04/12/2020); Bladder suspension (N/A, 04/12/2020); and Anterior and posterior repair (N/A, 04/12/2020). Her family history includes Brain cancer in her maternal grandfather; Breast cancer in her paternal aunt; Cervical cancer in her maternal grandmother and sister; Deafness in her daughter; Hypertension in her mother; Kidney disease in her father; Lung cancer in her maternal grandfather; Thyroid disease in her daughter; Uterine cancer in her sister. She  reports that she quit smoking about a year ago. Her smoking use included cigarettes. She has a 5.00 pack-year smoking history. She has never used smokeless tobacco. She reports previous drug  use. Frequency: 1.00 time per week. Drug: Marijuana. She reports that she does not drink alcohol. She has a current medication list which includes the following prescription(s): amitriptyline, buspirone, cetirizine, norel ad, cyclobenzaprine, fluoxetine hcl, gabapentin, hydrocortisone acetate, hydroxyzine, methocarbamol, nystatin, tramadol, valacyclovir, estradiol, metronidazole, and tolterodine. She is allergic to amoxicillin and penicillins.       Review of Systems:  Review of Systems  Constitutional: Denied constitutional symptoms, night sweats, recent illness, fatigue, fever, insomnia and weight loss.  Eyes: Denied eye symptoms, eye pain, photophobia, vision change and visual disturbance.  Ears/Nose/Throat/Neck: Denied ear, nose, throat or neck symptoms, hearing loss, nasal discharge, sinus congestion and sore throat.  Cardiovascular: Denied cardiovascular symptoms, arrhythmia, chest pain/pressure, edema, exercise intolerance, orthopnea and palpitations.  Respiratory: Denied pulmonary symptoms, asthma, pleuritic pain, productive sputum, cough, dyspnea and wheezing.  Gastrointestinal: Denied, gastro-esophageal reflux, melena, nausea and vomiting.  Genitourinary: See HPI for additional information.  Musculoskeletal: Denied musculoskeletal symptoms, stiffness, swelling, muscle weakness and myalgia.  Dermatologic: Denied dermatology symptoms, rash and scar.  Neurologic: Denied neurology symptoms, dizziness, headache, neck pain and syncope.  Psychiatric: Denied psychiatric symptoms, anxiety and depression.  Endocrine: Denied endocrine symptoms including hot flashes and night sweats.   Meds:   Current Outpatient Medications on File Prior to Visit  Medication Sig Dispense Refill  . amitriptyline (ELAVIL) 25 MG tablet Take 1 tablet (25 mg total) by mouth at bedtime. 90 tablet 1  . busPIRone (BUSPAR) 15 MG tablet Take 1 tablet (15 mg total) by mouth 2 (two) times daily. 60 tablet 1  . cetirizine  (ZYRTEC) 10 MG tablet Take 1 tablet (  10 mg total) by mouth daily. 30 tablet 0  . Chlorphen-PE-Acetaminophen (NOREL AD) 4-10-325 MG TABS Take 1 tablet by mouth every 4 (four) hours as needed (nasal congestion, cold symptoms). 20 tablet 1  . cyclobenzaprine (FLEXERIL) 10 MG tablet TAKE 1/2 TO 1 TABLETS BY MOUTH AT BEDTIME. 30 tablet 1  . FLUoxetine HCl 60 MG TABS Take 60 mg by mouth daily. 90 tablet 3  . gabapentin (NEURONTIN) 300 MG capsule Take 300 mg by mouth 3 (three) times daily.    . Hydrocortisone Acetate 1 % CREA Apply 1 application topically daily as needed (heat rash).    . hydrOXYzine (ATARAX/VISTARIL) 25 MG tablet TAKE 1 TABLET BY MOUTH EVERY 8 HOURS AS NEEDED. 60 tablet 3  . methocarbamol (ROBAXIN) 500 MG tablet Take 1 tablet by mouth every 8 (eight) hours as needed.    . nystatin (MYCOSTATIN/NYSTOP) powder Apply topically 2 (two) times daily. 15 g 5  . traMADol (ULTRAM) 50 MG tablet Take 50 mg by mouth 3 (three) times daily as needed.    . valACYclovir (VALTREX) 500 MG tablet Take 1 tablet (500 mg total) by mouth daily. Can increase to twice a day for 5 days in the event of a recurrence (Patient taking differently: Take 500 mg by mouth See admin instructions. Take 500 mg daily. Can increase to twice a day for 5 days in the event of a recurrence) 30 tablet 12   No current facility-administered medications on file prior to visit.          Objective:     Vitals:   08/25/20 1457  BP: 127/78  Pulse: 76   Filed Weights   08/25/20 1457  Weight: 233 lb (105.7 kg)                Assessment:    [id number] Patient Active Problem List   Diagnosis Date Noted  . Seasonal allergic rhinitis due to pollen 08/17/2020  . Postoperative state 04/12/2020  . Other female genital prolapse   . Pelvic and perineal pain   . Stress incontinence   . Generalized anxiety disorder 02/03/2020  . Genital herpes simplex 01/12/2020  . 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 Moderate episode of recurrent major  depressive disorder ([place]) 10/06/2019  . Tongue lesion 08/06/2019  . Tobacco use 08/06/2019  . Hx of cervical cancer 08/06/2019  . Right hip pain 08/06/2019  . Obesity (BMI 30.0-34.9) 08/06/2019  . Essential hypertension 08/06/2019  . Nodule of neck 08/06/2019     1. Symptomatic menopausal or female climacteric states   2. Bacterial vulvovaginitis   3. Urge incontinence        Plan:            1.  Because of her symptomatic menopause we will again increase her estrogen to 2 mg daily  2.  Patient would like to try something for "urge incontinence".  Detrol LA prescribed.  3.  Vaginal discharge with odor most likely BV.  We will treat with Flagyl.  If she continues to experience this issue we will perform speculum examination with cultures in 4 weeks at her next visit.  Orders No orders of the defined types were placed in this encounter.    Meds ordered this encounter  Medications  . tolterodine (DETROL LA) 2 MG 24 hr capsule    Sig: Take 1 capsule (2 mg total) by mouth daily.    Dispense:  30 capsule    Refill:  2  . metroNIDAZOLE (FLAGYL) 500 MG  tablet    Sig: Take 1 tablet (500 mg total) by mouth 2 (two) times daily for 7 days.    Dispense:  14 tablet    Refill:  0  . estradiol (ESTRACE) 2 MG tablet    Sig: Take 1 tablet (2 mg total) by mouth daily.    Dispense:  90 tablet    Refill:  0      F/U  Return in about 4 weeks (around 09/22/2020). I spent 22 minutes involved in the care of this patient preparing to see the patient by obtaining and reviewing her medical history (including labs, imaging tests and prior procedures), documenting clinical information in the electronic health record (EHR), counseling and coordinating care plans, writing and sending prescriptions, ordering tests or procedures and directly communicating with the patient by discussing pertinent items from her history and physical exam as well as detailing my assessment and plan as noted above so that she  has an informed understanding.  All of her questions were answered.  Finis Bud, M.D. 08/25/2020 3:19 PM

## 2020-08-27 ENCOUNTER — Encounter: Payer: Self-pay | Admitting: Family Medicine

## 2020-08-27 DIAGNOSIS — F411 Generalized anxiety disorder: Secondary | ICD-10-CM

## 2020-08-27 DIAGNOSIS — F331 Major depressive disorder, recurrent, moderate: Secondary | ICD-10-CM

## 2020-08-28 ENCOUNTER — Other Ambulatory Visit: Payer: Self-pay | Admitting: Family Medicine

## 2020-08-28 DIAGNOSIS — F411 Generalized anxiety disorder: Secondary | ICD-10-CM

## 2020-08-28 DIAGNOSIS — F331 Major depressive disorder, recurrent, moderate: Secondary | ICD-10-CM

## 2020-08-29 ENCOUNTER — Other Ambulatory Visit: Payer: Self-pay | Admitting: Family Medicine

## 2020-08-29 DIAGNOSIS — F411 Generalized anxiety disorder: Secondary | ICD-10-CM

## 2020-08-29 DIAGNOSIS — F331 Major depressive disorder, recurrent, moderate: Secondary | ICD-10-CM

## 2020-09-01 ENCOUNTER — Other Ambulatory Visit: Payer: Self-pay | Admitting: Family Medicine

## 2020-09-01 DIAGNOSIS — J301 Allergic rhinitis due to pollen: Secondary | ICD-10-CM

## 2020-09-01 DIAGNOSIS — M5416 Radiculopathy, lumbar region: Secondary | ICD-10-CM | POA: Diagnosis not present

## 2020-09-12 ENCOUNTER — Other Ambulatory Visit: Payer: Self-pay | Admitting: Family Medicine

## 2020-09-12 DIAGNOSIS — F331 Major depressive disorder, recurrent, moderate: Secondary | ICD-10-CM

## 2020-09-12 DIAGNOSIS — F411 Generalized anxiety disorder: Secondary | ICD-10-CM

## 2020-09-14 DIAGNOSIS — M5416 Radiculopathy, lumbar region: Secondary | ICD-10-CM

## 2020-09-22 ENCOUNTER — Encounter: Payer: Self-pay | Admitting: Obstetrics and Gynecology

## 2020-09-22 ENCOUNTER — Other Ambulatory Visit: Payer: Self-pay | Admitting: Obstetrics and Gynecology

## 2020-09-22 ENCOUNTER — Other Ambulatory Visit: Payer: Self-pay

## 2020-09-22 ENCOUNTER — Ambulatory Visit (INDEPENDENT_AMBULATORY_CARE_PROVIDER_SITE_OTHER): Payer: BC Managed Care – PPO | Admitting: Obstetrics and Gynecology

## 2020-09-22 VITALS — BP 123/84 | HR 71 | Ht 68.0 in | Wt 236.8 lb

## 2020-09-22 DIAGNOSIS — N951 Menopausal and female climacteric states: Secondary | ICD-10-CM

## 2020-09-22 DIAGNOSIS — B009 Herpesviral infection, unspecified: Secondary | ICD-10-CM

## 2020-09-22 DIAGNOSIS — N3941 Urge incontinence: Secondary | ICD-10-CM

## 2020-09-22 DIAGNOSIS — B9689 Other specified bacterial agents as the cause of diseases classified elsewhere: Secondary | ICD-10-CM

## 2020-09-22 MED ORDER — ESTRADIOL 1 MG PO TABS: 1.0000 mg | ORAL_TABLET | Freq: Every day | ORAL | 0 refills | Status: DC

## 2020-09-22 MED ORDER — VALACYCLOVIR HCL 500 MG PO TABS: 500.0000 mg | ORAL_TABLET | Freq: Two times a day (BID) | ORAL | 1 refills | Status: DC

## 2020-09-22 MED ORDER — OXYBUTYNIN CHLORIDE ER 10 MG PO TB24: 10.0000 mg | ORAL_TABLET | Freq: Every day | ORAL | 0 refills | Status: DC

## 2020-09-22 NOTE — Progress Notes (Signed)
HPI:      [name] is a 50 y.o. [phone] who LMP was Patient's last menstrual period was 06/23/2015.  Subjective:   She presents today stating that she believes she has a herpes outbreak which began yesterday evening.  She has a known history of herpes. In addition she has not been able to buy her estrogen or Detrol LA as prescribed.  She says that they are both too expensive for her budget.    Hx: The following portions of the patient's history were reviewed and updated as appropriate:             She  has a past medical history of Allergy, Anxiety, Cervical cancer ([place]) (2011), Chronic headaches, Depression, Frequent falls, GERD (gastroesophageal reflux disease), and Lumbar radiculopathy. She does not have any pertinent problems on file. She  has a past surgical history that includes Foot surgery (Right); Tubal ligation; Colposcopy (2011 or 2012); Parotidectomy (Right, 10/15/2019); Excision of tongue lesion (N/A, 10/15/2019); Laparoscopic vaginal hysterectomy with salpingo oophorectomy (Bilateral, 04/12/2020); Bladder suspension (N/A, 04/12/2020); and Anterior and posterior repair (N/A, 04/12/2020). Her family history includes Brain cancer in her maternal grandfather; Breast cancer in her paternal aunt; Cervical cancer in her maternal grandmother and sister; Deafness in her daughter; Hypertension in her mother; Kidney disease in her father; Lung cancer in her maternal grandfather; Thyroid disease in her daughter; Uterine cancer in her sister. She  reports that she quit smoking about 12 months ago. Her smoking use included cigarettes. She has a 5.00 pack-year smoking history. She has never used smokeless tobacco. She reports current drug use. Frequency: 1.00 time per week. Drug: Marijuana. She reports that she does not drink alcohol. She has a current medication list which includes the following prescription(s): amitriptyline, buspirone, cetirizine, norel ad, cyclobenzaprine, estradiol,  fluoxetine hcl, gabapentin, hydrocortisone acetate, hydroxyzine, methocarbamol, nystatin, oxybutynin, tramadol, valacyclovir, and diclofenac. She is allergic to amoxicillin and penicillins.       Review of Systems:  Review of Systems  Constitutional: Denied constitutional symptoms, night sweats, recent illness, fatigue, fever, insomnia and weight loss.  Eyes: Denied eye symptoms, eye pain, photophobia, vision change and visual disturbance.  Ears/Nose/Throat/Neck: Denied ear, nose, throat or neck symptoms, hearing loss, nasal discharge, sinus congestion and sore throat.  Cardiovascular: Denied cardiovascular symptoms, arrhythmia, chest pain/pressure, edema, exercise intolerance, orthopnea and palpitations.  Respiratory: Denied pulmonary symptoms, asthma, pleuritic pain, productive sputum, cough, dyspnea and wheezing.  Gastrointestinal: Denied, gastro-esophageal reflux, melena, nausea and vomiting.  Genitourinary: See HPI for additional information.  Musculoskeletal: Denied musculoskeletal symptoms, stiffness, swelling, muscle weakness and myalgia.  Dermatologic: Denied dermatology symptoms, rash and scar.  Neurologic: Denied neurology symptoms, dizziness, headache, neck pain and syncope.  Psychiatric: Denied psychiatric symptoms, anxiety and depression.  Endocrine: See HPI for additional information.   Meds:   Current Outpatient Medications on File Prior to Visit  Medication Sig Dispense Refill  . amitriptyline (ELAVIL) 25 MG tablet Take 1 tablet (25 mg total) by mouth at bedtime. 90 tablet 1  . busPIRone (BUSPAR) 15 MG tablet Take 1 tablet (15 mg total) by mouth 3 (three) times daily. 270 tablet 0  . cetirizine (ZYRTEC) 10 MG tablet TAKE 1 TABLET BY MOUTH EVERY DAY 90 tablet 0  . Chlorphen-PE-Acetaminophen (NOREL AD) 4-10-325 MG TABS Take 1 tablet by mouth every 4 (four) hours as needed (nasal congestion, cold symptoms). 20 tablet 1  . cyclobenzaprine (FLEXERIL) 10 MG tablet TAKE 1/2 TO 1  TABLETS BY MOUTH AT BEDTIME. [place]  tablet 1  . FLUoxetine HCl 60 MG TABS Take 60 mg by mouth daily. 90 tablet 3  . gabapentin (NEURONTIN) 300 MG capsule Take 300 mg by mouth 3 (three) times daily.    . Hydrocortisone Acetate 1 % CREA Apply 1 application topically daily as needed (heat rash).    . hydrOXYzine (ATARAX/VISTARIL) 25 MG tablet TAKE 1 TABLET BY MOUTH EVERY 8 HOURS AS NEEDED. 60 tablet 3  . methocarbamol (ROBAXIN) 500 MG tablet Take 1 tablet by mouth every 8 (eight) hours as needed.    . nystatin (MYCOSTATIN/NYSTOP) powder Apply topically 2 (two) times daily. 15 g 5  . traMADol (ULTRAM) 50 MG tablet Take 50 mg by mouth 3 (three) times daily as needed.    . diclofenac (VOLTAREN) 75 MG EC tablet Take 75 mg by mouth 2 (two) times daily as needed.     No current facility-administered medications on file prior to visit.          Objective:     Vitals:   09/22/20 1509  BP: 123/84  Pulse: 71   Filed Weights   09/22/20 1509  Weight: 236 lb 12.8 oz (107.4 kg)              examination of the vulva reveals a posterior fourchette small lesion likely consistent with HSV.  Assessment:    [id number] Patient Active Problem List   Diagnosis Date Noted  . Seasonal allergic rhinitis due to pollen 08/17/2020  . Postoperative state 04/12/2020  . Other female genital prolapse   . Pelvic and perineal pain   . Stress incontinence   . Generalized anxiety disorder 02/03/2020  . Genital herpes simplex 01/12/2020  . 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 Moderate episode of recurrent major depressive disorder ([place]) 10/06/2019  . Tongue lesion 08/06/2019  . Tobacco use 08/06/2019  . Hx of cervical cancer 08/06/2019  . Right hip pain 08/06/2019  . Obesity (BMI 30.0-34.9) 08/06/2019  . Essential hypertension 08/06/2019  . Nodule of neck 08/06/2019     1. HSV-2 (herpes simplex virus 2) infection   2. Symptomatic menopausal or female climacteric states   3. Urge incontinence        Plan:             1.  Prescription for Valtrex written.  2.  We have discussed using good Rx.  I have given her cards and shown her the app for her phone.  I have changed her medications to 90 days so that she gets the best value for her money.  I have changed the Detrol to Ditropan XL for cost savings.  Orders No orders of the defined types were placed in this encounter.    Meds ordered this encounter  Medications  . valACYclovir (VALTREX) 500 MG tablet    Sig: Take 1 tablet (500 mg total) by mouth 2 (two) times daily for 3 days.    Dispense:  12 tablet    Refill:  1  . oxybutynin (DITROPAN XL) 10 MG 24 hr tablet    Sig: Take 1 tablet (10 mg total) by mouth at bedtime.    Dispense:  90 tablet    Refill:  0  . estradiol (ESTRACE) 1 MG tablet    Sig: Take 1 tablet (1 mg total) by mouth daily.    Dispense:  90 tablet    Refill:  0      F/U  Return in about 3 months (around 12/23/2020). I spent 23 minutes involved in  the care of this patient preparing to see the patient by obtaining and reviewing her medical history (including labs, imaging tests and prior procedures), documenting clinical information in the electronic health record (EHR), counseling and coordinating care plans, writing and sending prescriptions, ordering tests or procedures and directly communicating with the patient by discussing pertinent items from her history and physical exam as well as detailing my assessment and plan as noted above so that she has an informed understanding.  All of her questions were answered.  Finis Bud, M.D. 09/22/2020 3:40 PM

## 2020-09-24 ENCOUNTER — Other Ambulatory Visit: Payer: Self-pay | Admitting: Family Medicine

## 2020-09-24 DIAGNOSIS — F411 Generalized anxiety disorder: Secondary | ICD-10-CM

## 2020-09-24 DIAGNOSIS — F331 Major depressive disorder, recurrent, moderate: Secondary | ICD-10-CM

## 2020-09-29 ENCOUNTER — Other Ambulatory Visit: Payer: Self-pay | Admitting: Obstetrics and Gynecology

## 2020-09-29 DIAGNOSIS — B9689 Other specified bacterial agents as the cause of diseases classified elsewhere: Secondary | ICD-10-CM

## 2020-10-07 ENCOUNTER — Other Ambulatory Visit: Payer: Self-pay

## 2020-10-07 ENCOUNTER — Ambulatory Visit (INDEPENDENT_AMBULATORY_CARE_PROVIDER_SITE_OTHER): Payer: BC Managed Care – PPO | Admitting: Obstetrics and Gynecology

## 2020-10-07 ENCOUNTER — Encounter: Payer: Self-pay | Admitting: Obstetrics and Gynecology

## 2020-10-07 VITALS — BP 118/80 | HR 89 | Ht 68.0 in | Wt 236.9 lb

## 2020-10-07 DIAGNOSIS — N898 Other specified noninflammatory disorders of vagina: Secondary | ICD-10-CM | POA: Diagnosis not present

## 2020-10-07 MED ORDER — NITROFURANTOIN MONOHYD MACRO 100 MG PO CAPS: 100.0000 mg | ORAL_CAPSULE | Freq: Two times a day (BID) | ORAL | 0 refills | Status: DC

## 2020-10-07 NOTE — Addendum Note (Signed)
Addended by: Durwin Glaze on: 10/07/2020 03:34 PM   Modules accepted: Orders

## 2020-10-07 NOTE — Progress Notes (Addendum)
HPI:      [name] is a 50 y.o. [phone] who LMP was Patient's last menstrual period was 06/23/2015.  Subjective:   She presents today stating that she woke up from sleep 2 days ago with what appeared to be a blackish discharge from the vagina.  She states that has not happened since that time.  She seems relatively certain that this was not coming from the rectum. She does report that she occasionally has midline upper and right-sided abdominal pain.  She also states that she has occasional midline lower pelvic pain. She is taking medicine for a urinary tract infection at this time.    Hx: The following portions of the patient's history were reviewed and updated as appropriate:             She  has a past medical history of Allergy, Anxiety, Cervical cancer ([place]) (2011), Chronic headaches, Depression, Frequent falls, GERD (gastroesophageal reflux disease), and Lumbar radiculopathy. She does not have any pertinent problems on file. She  has a past surgical history that includes Foot surgery (Right); Tubal ligation; Colposcopy (2011 or 2012); Parotidectomy (Right, 10/15/2019); Excision of tongue lesion (N/A, 10/15/2019); Laparoscopic vaginal hysterectomy with salpingo oophorectomy (Bilateral, 04/12/2020); Bladder suspension (N/A, 04/12/2020); and Anterior and posterior repair (N/A, 04/12/2020). Her family history includes Brain cancer in her maternal grandfather; Breast cancer in her paternal aunt; Cervical cancer in her maternal grandmother and sister; Deafness in her daughter; Hypertension in her mother; Kidney disease in her father; Lung cancer in her maternal grandfather; Thyroid disease in her daughter; Uterine cancer in her sister. She  reports that she quit smoking about 13 months ago. Her smoking use included cigarettes. She has a 5.00 pack-year smoking history. She has never used smokeless tobacco. She reports current drug use. Frequency: 1.00 time per week. Drug: Marijuana. She  reports that she does not drink alcohol. She has a current medication list which includes the following prescription(s): amitriptyline, buspirone, cetirizine, norel ad, cyclobenzaprine, diclofenac, estradiol, fluoxetine hcl, gabapentin, hydrocortisone acetate, hydroxyzine, methocarbamol, nystatin, oxybutynin, and tramadol. She is allergic to amoxicillin and penicillins.       Review of Systems:  Review of Systems  Constitutional: Denied constitutional symptoms, night sweats, recent illness, fatigue, fever, insomnia and weight loss.  Eyes: Denied eye symptoms, eye pain, photophobia, vision change and visual disturbance.  Ears/Nose/Throat/Neck: Denied ear, nose, throat or neck symptoms, hearing loss, nasal discharge, sinus congestion and sore throat.  Cardiovascular: Denied cardiovascular symptoms, arrhythmia, chest pain/pressure, edema, exercise intolerance, orthopnea and palpitations.  Respiratory: Denied pulmonary symptoms, asthma, pleuritic pain, productive sputum, cough, dyspnea and wheezing.  Gastrointestinal: Denied, gastro-esophageal reflux, melena, nausea and vomiting.  Genitourinary: See HPI for additional information.  Musculoskeletal: Denied musculoskeletal symptoms, stiffness, swelling, muscle weakness and myalgia.  Dermatologic: Denied dermatology symptoms, rash and scar.  Neurologic: Denied neurology symptoms, dizziness, headache, neck pain and syncope.  Psychiatric: Denied psychiatric symptoms, anxiety and depression.  Endocrine: Denied endocrine symptoms including hot flashes and night sweats.   Meds:   Current Outpatient Medications on File Prior to Visit  Medication Sig Dispense Refill   amitriptyline (ELAVIL) 25 MG tablet Take 1 tablet (25 mg total) by mouth at bedtime. 90 tablet 1   busPIRone (BUSPAR) 15 MG tablet Take 1 tablet (15 mg total) by mouth 3 (three) times daily. 270 tablet 0   cetirizine (ZYRTEC) 10 MG tablet TAKE 1 TABLET BY MOUTH EVERY DAY 90 tablet 0    Chlorphen-PE-Acetaminophen (NOREL AD) 4-10-325 MG TABS  Take 1 tablet by mouth every 4 (four) hours as needed (nasal congestion, cold symptoms). 20 tablet 1   cyclobenzaprine (FLEXERIL) 10 MG tablet TAKE 1/2 TO 1 TABLETS BY MOUTH AT BEDTIME. 30 tablet 1   diclofenac (VOLTAREN) 75 MG EC tablet Take 75 mg by mouth 2 (two) times daily as needed.     estradiol (ESTRACE) 1 MG tablet Take 1 tablet (1 mg total) by mouth daily. 90 tablet 0   FLUoxetine HCl 60 MG TABS Take 60 mg by mouth daily. 90 tablet 3   gabapentin (NEURONTIN) 300 MG capsule Take 300 mg by mouth 3 (three) times daily.     Hydrocortisone Acetate 1 % CREA Apply 1 application topically daily as needed (heat rash).     hydrOXYzine (ATARAX/VISTARIL) 25 MG tablet TAKE 1 TABLET BY MOUTH EVERY 8 HOURS AS NEEDED. 60 tablet 3   methocarbamol (ROBAXIN) 500 MG tablet Take 1 tablet by mouth every 8 (eight) hours as needed.     nystatin (MYCOSTATIN/NYSTOP) powder Apply topically 2 (two) times daily. 15 g 5   oxybutynin (DITROPAN XL) 10 MG 24 hr tablet Take 1 tablet (10 mg total) by mouth at bedtime. 90 tablet 0   traMADol (ULTRAM) 50 MG tablet Take 50 mg by mouth 3 (three) times daily as needed.     No current facility-administered medications on file prior to visit.          Objective:     Vitals:   10/07/20 1029  BP: 118/80  Pulse: 89   Filed Weights   10/07/20 1029  Weight: 236 lb 14.4 oz (107.5 kg)       Pelvic:   Vulva: Normal appearance.  No lesions.  Vagina: No lesions or abnormalities noted.  Support: Normal pelvic support.  Urethra No masses tenderness or scarring.  Meatus Normal size without lesions or prolapse  Vag Cuff: Intact.  No lesions.  Anus: Normal exam.  No lesions.  Perineum: Normal exam.  No lesions.        Bimanual   Adnexae: No masses.  Non-tender to palpation.  Cuff: Negative for abnormality.     Assessment:    [id number] Patient Active Problem List   Diagnosis Date Noted   Seasonal allergic  rhinitis due to pollen 08/17/2020   Postoperative state 04/12/2020   Other female genital prolapse    Pelvic and perineal pain    Stress incontinence    Generalized anxiety disorder 02/03/2020   Genital herpes simplex 01/12/2020   Parotid mass 10/15/2019   Moderate episode of recurrent major depressive disorder ([place]) 10/06/2019   Tongue lesion 08/06/2019   Tobacco use 08/06/2019   Hx of cervical cancer 08/06/2019   Right hip pain 08/06/2019   Obesity (BMI 30.0-34.9) 08/06/2019   Essential hypertension 08/06/2019   Nodule of neck 08/06/2019     1. Vaginal discharge     No evidence of vaginal or vulvar abnormality.  Small amount of vaginal discharge noted appears normal.   Plan:            1.  I can find no reason at this time for any type of black vaginal discharge as patient has described.  Continued expectant management.  If discharge returns patient to inform us.  2.  Patient still being treated for urinary tract infection-advised to manage all of medication. 3.  She has complaint of occasional midline and right upper abdominal pain.  I have asked her to track this and related to meals if possible.   (  Possible gallbladder disease ).  Would consider abdominal ultrasound if patient continues to have right upper quadrant pain after meals.    Orders No orders of the defined types were placed in this encounter.   No orders of the defined types were placed in this encounter.     F/U  Return for Pt to contact us if symptoms worsen. I spent 22 minutes involved in the care of this patient preparing to see the patient by obtaining and reviewing her medical history (including labs, imaging tests and prior procedures), documenting clinical information in the electronic health record (EHR), counseling and coordinating care plans, writing and sending prescriptions, ordering tests or procedures and directly communicating with the patient by discussing pertinent items from her history and  physical exam as well as detailing my assessment and plan as noted above so that she has an informed understanding.  All of her questions were answered.  Finis Bud, M.D. 10/07/2020 11:11 AM

## 2020-10-15 ENCOUNTER — Other Ambulatory Visit: Payer: Self-pay | Admitting: Surgical

## 2020-10-15 DIAGNOSIS — R1011 Right upper quadrant pain: Secondary | ICD-10-CM

## 2020-10-24 ENCOUNTER — Encounter: Payer: Self-pay | Admitting: Family Medicine

## 2020-10-25 NOTE — Telephone Encounter (Signed)
Please advise. JML

## 2020-10-28 DIAGNOSIS — G894 Chronic pain syndrome: Secondary | ICD-10-CM | POA: Diagnosis not present

## 2020-10-28 DIAGNOSIS — M5416 Radiculopathy, lumbar region: Secondary | ICD-10-CM | POA: Diagnosis not present

## 2020-10-28 DIAGNOSIS — M549 Dorsalgia, unspecified: Secondary | ICD-10-CM | POA: Diagnosis not present

## 2020-10-28 DIAGNOSIS — M461 Sacroiliitis, not elsewhere classified: Secondary | ICD-10-CM | POA: Diagnosis not present

## 2020-10-28 DIAGNOSIS — Z1389 Encounter for screening for other disorder: Secondary | ICD-10-CM | POA: Diagnosis not present

## 2020-10-28 DIAGNOSIS — M7061 Trochanteric bursitis, right hip: Secondary | ICD-10-CM | POA: Diagnosis not present

## 2020-10-29 DIAGNOSIS — M5416 Radiculopathy, lumbar region: Secondary | ICD-10-CM | POA: Diagnosis not present

## 2020-11-08 ENCOUNTER — Ambulatory Visit
Admission: RE | Admit: 2020-11-08 | Discharge: 2020-11-08 | Disposition: A | Discharge status: Home / Self Care | Payer: BC Managed Care – PPO | Source: Ambulatory Visit | Attending: Obstetrics and Gynecology | Admitting: Obstetrics and Gynecology

## 2020-11-08 ENCOUNTER — Other Ambulatory Visit: Payer: Self-pay

## 2020-11-08 DIAGNOSIS — K760 Fatty (change of) liver, not elsewhere classified: Secondary | ICD-10-CM | POA: Diagnosis not present

## 2020-11-08 DIAGNOSIS — R1011 Right upper quadrant pain: Secondary | ICD-10-CM

## 2020-11-08 DIAGNOSIS — K8020 Calculus of gallbladder without cholecystitis without obstruction: Secondary | ICD-10-CM | POA: Diagnosis not present

## 2020-11-09 DIAGNOSIS — M5416 Radiculopathy, lumbar region: Secondary | ICD-10-CM | POA: Diagnosis not present

## 2020-11-10 ENCOUNTER — Other Ambulatory Visit: Payer: Self-pay | Admitting: Surgical

## 2020-11-10 DIAGNOSIS — K829 Disease of gallbladder, unspecified: Secondary | ICD-10-CM

## 2020-11-12 ENCOUNTER — Other Ambulatory Visit: Payer: Self-pay | Admitting: Family Medicine

## 2020-11-15 NOTE — Telephone Encounter (Signed)
Last filled on 08/18/20 #30 with 1 refill LOV 08/17/20 follow up No future appointments

## 2020-11-18 ENCOUNTER — Other Ambulatory Visit: Payer: Self-pay | Admitting: Certified Nurse Midwife

## 2020-11-20 ENCOUNTER — Other Ambulatory Visit: Payer: Self-pay | Admitting: Obstetrics and Gynecology

## 2020-11-20 DIAGNOSIS — N3941 Urge incontinence: Secondary | ICD-10-CM

## 2020-11-25 DIAGNOSIS — R197 Diarrhea, unspecified: Secondary | ICD-10-CM | POA: Diagnosis not present

## 2020-11-25 DIAGNOSIS — K8020 Calculus of gallbladder without cholecystitis without obstruction: Secondary | ICD-10-CM | POA: Diagnosis not present

## 2020-11-25 DIAGNOSIS — K219 Gastro-esophageal reflux disease without esophagitis: Secondary | ICD-10-CM | POA: Diagnosis not present

## 2020-11-27 ENCOUNTER — Other Ambulatory Visit: Payer: Self-pay | Admitting: Family Medicine

## 2020-11-27 DIAGNOSIS — F331 Major depressive disorder, recurrent, moderate: Secondary | ICD-10-CM

## 2020-11-27 DIAGNOSIS — F411 Generalized anxiety disorder: Secondary | ICD-10-CM

## 2020-11-29 DIAGNOSIS — M5416 Radiculopathy, lumbar region: Secondary | ICD-10-CM | POA: Diagnosis not present

## 2020-11-30 NOTE — Telephone Encounter (Signed)
Last OV - 08/17/2020 Next OV - N/A Last Filled -  Buspirone (BUSPAR) - 08/30/2020 Fluoxetine (Prozac) - 02/03/2020

## 2020-11-30 NOTE — Telephone Encounter (Signed)
Pt is on 60 mg of fluoxetine

## 2020-12-01 DIAGNOSIS — R197 Diarrhea, unspecified: Secondary | ICD-10-CM | POA: Diagnosis not present

## 2020-12-01 DIAGNOSIS — Z1389 Encounter for screening for other disorder: Secondary | ICD-10-CM | POA: Diagnosis not present

## 2020-12-01 DIAGNOSIS — K8020 Calculus of gallbladder without cholecystitis without obstruction: Secondary | ICD-10-CM | POA: Diagnosis not present

## 2020-12-01 DIAGNOSIS — K219 Gastro-esophageal reflux disease without esophagitis: Secondary | ICD-10-CM | POA: Diagnosis not present

## 2020-12-01 DIAGNOSIS — G894 Chronic pain syndrome: Secondary | ICD-10-CM | POA: Diagnosis not present

## 2020-12-01 DIAGNOSIS — M549 Dorsalgia, unspecified: Secondary | ICD-10-CM | POA: Diagnosis not present

## 2020-12-01 DIAGNOSIS — M5416 Radiculopathy, lumbar region: Secondary | ICD-10-CM | POA: Diagnosis not present

## 2020-12-07 ENCOUNTER — Other Ambulatory Visit: Payer: Self-pay | Admitting: Family Medicine

## 2020-12-07 DIAGNOSIS — F331 Major depressive disorder, recurrent, moderate: Secondary | ICD-10-CM

## 2020-12-07 DIAGNOSIS — F411 Generalized anxiety disorder: Secondary | ICD-10-CM

## 2020-12-23 ENCOUNTER — Encounter: Payer: BC Managed Care – PPO | Admitting: Obstetrics and Gynecology

## 2020-12-23 ENCOUNTER — Other Ambulatory Visit: Payer: Self-pay | Admitting: Family Medicine

## 2020-12-23 DIAGNOSIS — F331 Major depressive disorder, recurrent, moderate: Secondary | ICD-10-CM

## 2020-12-26 ENCOUNTER — Other Ambulatory Visit: Payer: Self-pay | Admitting: Family Medicine

## 2020-12-26 DIAGNOSIS — F331 Major depressive disorder, recurrent, moderate: Secondary | ICD-10-CM

## 2020-12-30 DIAGNOSIS — M5416 Radiculopathy, lumbar region: Secondary | ICD-10-CM | POA: Diagnosis not present

## 2021-01-04 DIAGNOSIS — G894 Chronic pain syndrome: Secondary | ICD-10-CM | POA: Diagnosis not present

## 2021-01-04 DIAGNOSIS — M549 Dorsalgia, unspecified: Secondary | ICD-10-CM | POA: Diagnosis not present

## 2021-01-04 DIAGNOSIS — M5416 Radiculopathy, lumbar region: Secondary | ICD-10-CM | POA: Diagnosis not present

## 2021-01-05 ENCOUNTER — Other Ambulatory Visit: Payer: Self-pay | Admitting: Family Medicine

## 2021-01-06 ENCOUNTER — Encounter: Payer: Self-pay | Admitting: Family Medicine

## 2021-01-07 ENCOUNTER — Other Ambulatory Visit: Payer: Self-pay

## 2021-01-07 DIAGNOSIS — F411 Generalized anxiety disorder: Secondary | ICD-10-CM

## 2021-01-07 DIAGNOSIS — F331 Major depressive disorder, recurrent, moderate: Secondary | ICD-10-CM

## 2021-01-07 DIAGNOSIS — F410 Panic disorder [episodic paroxysmal anxiety] without agoraphobia: Secondary | ICD-10-CM

## 2021-01-07 MED ORDER — CYCLOBENZAPRINE HCL 10 MG PO TABS: ORAL_TABLET | ORAL | 0 refills | Status: DC

## 2021-01-07 MED ORDER — HYDROXYZINE HCL 25 MG PO TABS: 25.0000 mg | ORAL_TABLET | Freq: Three times a day (TID) | ORAL | 0 refills | Status: DC | PRN

## 2021-01-07 MED ORDER — FLUOXETINE HCL 60 MG PO TABS: 60.0000 mg | ORAL_TABLET | Freq: Every day | ORAL | 0 refills | Status: DC

## 2021-01-16 ENCOUNTER — Encounter: Payer: Self-pay | Admitting: Family Medicine

## 2021-01-17 ENCOUNTER — Other Ambulatory Visit: Payer: Self-pay | Admitting: Family Medicine

## 2021-01-17 DIAGNOSIS — F411 Generalized anxiety disorder: Secondary | ICD-10-CM

## 2021-01-17 DIAGNOSIS — F331 Major depressive disorder, recurrent, moderate: Secondary | ICD-10-CM

## 2021-01-17 NOTE — Telephone Encounter (Signed)
LMTCB to schedule a sooner appt

## 2021-01-21 ENCOUNTER — Other Ambulatory Visit: Payer: Self-pay | Admitting: Family Medicine

## 2021-01-21 ENCOUNTER — Other Ambulatory Visit: Payer: Self-pay | Admitting: General Surgery

## 2021-01-21 DIAGNOSIS — F410 Panic disorder [episodic paroxysmal anxiety] without agoraphobia: Secondary | ICD-10-CM

## 2021-01-21 NOTE — Progress Notes (Signed)
Subjective:     Patient ID: Erin Dorsey is a 50 y.o. female.   HPI   The following portions of the patient's history were reviewed and updated as appropriate.   This is a new patient that is here today for: office visit. Patient has been referred today by Dr. Jeannie Fend for evaluation of gallstones and gallbladder polyp.   She admits to having right abdominal pain radiating up into her shoulder, lasting 30 minutes to an hour.  This began somewhere between June 2021 and December 2021.  It was hard to pinpoint a clear starting point.  She states the pain is associated with any kind of foods, and may be associated diarrhea.  (Watery with mucus.  No blood.)   She had 1 episode of what she described as black material on the toilet paper when wiping back in June 2021.  No recurrence.  She apparently does not frequently look at the stools, only what might be on the toilet paper.   She admits to about a 6 pound weight loss over the last month.  October 07, 2020 appointment with Dr. Amalia Hailey weight was almost 237 pounds.   PCP weight record:      Wt Readings from Last 3 Encounters:  08/17/20 228 lb 8 oz (103.6 kg)  05/26/20 222 lb 4.8 oz (100.8 kg)  04/28/20 217 lb 14.4 oz (98.8 kg)      She reports significant amount of heartburn and indigestion.   She denies any nausea or vomiting.   The patient's mother has undergone a cholecystectomy.  A maternal grandmother may have had colon cancer and a maternal aunt may have had breast cancer.   The patient reports she is followed at a pain clinic in [place], [place].      Chief Complaint  Patient presents with   Abdominal Pain      BP 114/72   Pulse 77   Temp 36.7 C (98.1 F)   Ht 175.3 cm (5\' 9" )   Wt (!) 104.3 kg (230 lb)   SpO2 97%   BMI 33.97 kg/m        Past Medical History:  Diagnosis Date   Anxiety     Cervical cancer (CMS-HCC) 2011   Chronic headache     Depression     Fall     GERD (gastroesophageal reflux  disease)     Lumbar radiculopathy             Past Surgical History:  Procedure Laterality Date   bladder suspension   04/12/2020   EXCISION TONGUE LESION   10/15/2019   foot surgery Right     LAPAROSCOPIC TUBAL LIGATION       LAPAROSCOPIC VAGINAL HYSTERECTOMY Bilateral 04/12/2020    salpingo oophorectomy   PAROTIDECTOMY Right 10/15/2019                OB History     Gravida  4   Para  3   Term      Preterm      AB      Living         SAB      IAB      Ectopic      Molar      Multiple      Live Births           Obstetric Comments  Age at first period 20 Age of first pregnancy 53  Social History           Socioeconomic History   Marital status: Married  Tobacco Use   Smoking status: Former Smoker      Packs/day: 0.25      Years: 20.00      Pack years: 5.00      Types: Cigarettes      Quit date: 09/01/2019      Years since quitting: 1.2   Smokeless tobacco: Never Used  Substance and Sexual Activity   Alcohol use: Never   Drug use: Never            Allergies  Allergen Reactions   Amoxil [Amoxicillin] Rash   Penicillin Rash      Current Medications        Current Outpatient Medications  Medication Sig Dispense Refill   amitriptyline (ELAVIL) 25 MG tablet Take by mouth nightly       busPIRone (BUSPAR) 15 MG tablet Take 15 mg by mouth 3 (three) times daily       cetirizine (ZYRTEC) 10 MG tablet Take 1 tablet by mouth once daily       cyclobenzaprine (FLEXERIL) 10 MG tablet nightly       FLUoxetine 60 mg Take by mouth once daily       gabapentin (NEURONTIN) 300 MG capsule Take 300 mg by mouth 3 (three) times daily       hydrOXYzine (ATARAX) 25 MG tablet Take 1 tablet by mouth every 8 (eight) hours as needed       methocarbamoL (ROBAXIN) 500 MG tablet Take 1 tablet by mouth every 8 (eight) hours as needed       naloxone (NARCAN) 4 mg/actuation nasal spray as needed       oxybutynin (DITROPAN-XL) 10 MG XL tablet Take 1  tablet by mouth nightly       oxyCODONE-acetaminophen (PERCOCET) 5-325 mg tablet as needed       ZTLIDO 1.8 % PtMd once daily        No current facility-administered medications for this visit.             Family History  Problem Relation Age of Onset   High blood pressure (Hypertension) Mother     Kidney disease Father     Cervical cancer Sister     Uterine cancer Sister     Cervical cancer Maternal Grandmother     Lung cancer Maternal Grandfather     Brain cancer Maternal Grandfather     Thyroid cancer Daughter     Breast cancer Paternal Aunt        Review of Systems  Constitutional: Negative for chills and fever.  Respiratory: Negative for cough.   Gastrointestinal: Positive for abdominal pain and diarrhea. Negative for nausea and vomiting.         Objective:   Physical Exam Exam conducted with a chaperone present.  Constitutional:      Appearance: Normal appearance.  Cardiovascular:     Rate and Rhythm: Normal rate and regular rhythm.     Pulses: Normal pulses.     Heart sounds: Normal heart sounds.  Pulmonary:     Effort: Pulmonary effort is normal.     Breath sounds: Normal breath sounds.  Abdominal:     General: Abdomen is protuberant. Bowel sounds are normal. There is abdominal bruit. There is no distension.     Palpations: Abdomen is soft.     Tenderness: There is abdominal tenderness in the right upper quadrant and epigastric  area. There is no guarding or rebound.       Comments: Area of tenderness extends above the level of the costochondral cartilage.  No peritoneal irritation, guarding or referred pain.  Musculoskeletal:     Cervical back: Neck supple.  Skin:    General: Skin is warm and dry.  Neurological:     Mental Status: She is alert and oriented to person, place, and time.  Psychiatric:        Mood and Affect: Mood normal.        Behavior: Behavior normal.        Labs and Radiology:    Component Ref Range & Units 7 mo ago 1 yr ago   Tricyclic, Ur Screen NONE DETECTED POSITIVE Abnormal   POSITIVE Abnormal    Amphetamines, Ur Screen NONE DETECTED NONE DETECTED  NONE DETECTED   MDMA (Ecstasy)Ur Screen NONE DETECTED NONE DETECTED  NONE DETECTED   Cocaine Metabolite,Ur [place] NONE DETECTED NONE DETECTED  NONE DETECTED   Opiate, Ur Screen NONE DETECTED NONE DETECTED  NONE DETECTED   Phencyclidine (PCP) Ur S NONE DETECTED NONE DETECTED  NONE DETECTED   Cannabinoid 50 Ng, Ur [place] NONE DETECTED POSITIVE Abnormal   POSITIVE Abnormal    Barbiturates, Ur Screen NONE DETECTED NONE DETECTED  NONE DETECTED   Benzodiazepine, Ur Scrn NONE DETECTED NONE DETECTED  NONE DETECTED   Methadone Scn, Ur NONE DETECTED NONE DETECTED  NONE DETECTED     Pathology April 12, 2020:   DIAGNOSIS:  A. UTERUS WITH CERVIX, BILATERAL FALLOPIAN TUBES AND OVARIES; TOTAL  HYSTERECTOMY WITH BILATERAL SALPINGO-OOPHORECTOMY:  - UTERINE CERVIX:       - BENIGN TRANSFORMATION ZONE.       - NEGATIVE FOR SQUAMOUS INTRAEPITHELIAL LESION AND MALIGNANCY.  - ENDOMETRIUM:       - WEAKLY PROLIFERATIVE ENDOMETRIUM.       - NEGATIVE FOR ATYPICAL HYPERPLASIA/EIN AND MALIGNANCY.  - MYOMETRIUM:       - LEIOMYOMATA UTERI, WITH FOCAL HYALINIZATION.       - NEGATIVE FOR FEATURES OF MALIGNANCY.  - FALLOPIAN TUBES:       - NO SIGNIFICANT HISTOPATHOLOGIC CHANGE.  - OVARIES:       - BENIGN PHYSIOLOGIC CHANGES.    August 06, 2019 laboratory:   Component Ref Range & Units 1 yr ago   Sodium 135 - 145 mEq/L 143   Potassium 3.5 - 5.1 mEq/L 3.5   Chloride 96 - 112 mEq/L 109   CO2 19 - 32 mEq/L 25   Glucose, Bld 70 - 99 mg/dL 74   BUN 6 - 23 mg/dL 6   Creatinine, Ser 0.40 - 1.20 mg/dL 0.70   Total Bilirubin 0.2 - 1.2 mg/dL 0.5   Alkaline Phosphatase 39 - 117 U/L 32 Low    AST 0 - 37 U/L 18   ALT 0 - 35 U/L 16   Total Protein 6.0 - 8.3 g/dL 7.2   Albumin 3.5 - 5.2 g/dL 4.2   GFR >60.00 mL/min 89.68   Calcium 8.4 - 10.5 mg/dL 9.1     Component Ref Range & Units 1 yr ago    WBC 4.0 - 10.5 K/uL 4.7   RBC 3.87 - 5.11 Mil/uL 4.22   Hemoglobin 12.0 - 15.0 g/dL 14.3   HCT 36.0 - 46.0 % 43.1   MCV 78.0 - 100.0 fl 102.0 High    MCHC 30.0 - 36.0 g/dL 33.3   RDW 11.5 - 15.5 % 13.2   Platelets 150.0 -  400.0 K/uL 161.0   Neutrophils Relative % 43.0 - 77.0 % 59.9   Lymphocytes Relative 12.0 - 46.0 % 31.1   Monocytes Relative 3.0 - 12.0 % 7.1   Eosinophils Relative 0.0 - 5.0 % 1.1   Basophils Relative 0.0 - 3.0 % 0.8   Neutro Abs 1.4 - 7.7 K/uL 2.8   Lymphs Abs 0.7 - 4.0 K/uL 1.5   Monocytes Absolute 0.1 - 1.0 K/uL 0.3   Eosinophils Absolute 0.0 - 0.7 K/uL 0.1   Basophils Absolute 0.0 - 0.1 K/uL 0.0   These are the most current labs available at the time of her assessment.    Abdominal ultrasound dated November 08, 2020:   IMPRESSION: 1. Gallstones. No gallbladder wall thickening, pericholecystic fluid or positive sonographic Murphy's sign. 2. Gallbladder polyp. This measures 8 mm. Follow-up ultrasound in 12 months is recommended to assess for interval growth. This recommendation follows ACR consensus guidelines: White Paper of the ACR Incidental Findings Committee II on Gallbladder and Biliary Findings. J Am Coll Radiol 2013:;10:953-956. 3. Hepatic steatosis.   This study was independently reviewed.    Assessment:     Cholelithiasis, possible cholecystitis.   Pronounced change in bowel habits with mucus.   Worsening gastroesophageal reflux.   Weight loss secondary to dietary avoidance.    Plan:     The case was discussed with Jeannie Fend, MD from GYN.   The patient's short-lived pain post any oral intake, 30 to 60 minutes, is somewhat atypical for biliary colic (she reports dry white toast is just is likely to produce symptoms as fried foods).  The area of tenderness on exam certainly extends beyond the costal margin.   She is being followed by pain management, and at the time of today's exam had a patch near the right flank at about the level of  T8-9.  It is certainly possible that there is a radicular component to her pain, but this would not explain the loose stools post meals.   She may benefit from cholecystectomy, but upper and lower endoscopy ahead of time is warranted.   We will arrange for current laboratory studies prior to the procedure scheduled for December 06, 2020.   Approximately 1 hour was spent with the patient and chart review/physician consultation.      This note is partially prepared by Ledell Noss, CMA acting as a scribe in the presence of Dr. Hervey Ard, MD.    The documentation recorded by the scribe accurately reflects the service I personally performed and the decisions made by me.    Robert Bellow, MD FACS

## 2021-01-25 ENCOUNTER — Telehealth: Payer: BC Managed Care – PPO | Admitting: Family Medicine

## 2021-01-25 ENCOUNTER — Encounter: Payer: Self-pay | Admitting: General Surgery

## 2021-01-26 ENCOUNTER — Ambulatory Visit
Admission: RE | Admit: 2021-01-26 | Discharge: 2021-01-26 | Disposition: A | Discharge status: Home / Self Care | Payer: BC Managed Care – PPO | Attending: General Surgery | Admitting: General Surgery

## 2021-01-26 ENCOUNTER — Ambulatory Visit: Payer: BC Managed Care – PPO | Admitting: Anesthesiology

## 2021-01-26 ENCOUNTER — Encounter
Admission: RE | Disposition: A | Discharge status: Home / Self Care | Payer: Self-pay | Source: Home / Self Care | Attending: General Surgery

## 2021-01-26 DIAGNOSIS — Z880 Allergy status to penicillin: Secondary | ICD-10-CM | POA: Diagnosis not present

## 2021-01-26 DIAGNOSIS — D122 Benign neoplasm of ascending colon: Secondary | ICD-10-CM | POA: Diagnosis not present

## 2021-01-26 DIAGNOSIS — K2970 Gastritis, unspecified, without bleeding: Secondary | ICD-10-CM | POA: Diagnosis not present

## 2021-01-26 DIAGNOSIS — R1013 Epigastric pain: Secondary | ICD-10-CM | POA: Diagnosis not present

## 2021-01-26 DIAGNOSIS — K635 Polyp of colon: Secondary | ICD-10-CM | POA: Diagnosis not present

## 2021-01-26 DIAGNOSIS — K5730 Diverticulosis of large intestine without perforation or abscess without bleeding: Secondary | ICD-10-CM | POA: Diagnosis not present

## 2021-01-26 DIAGNOSIS — K5790 Diverticulosis of intestine, part unspecified, without perforation or abscess without bleeding: Secondary | ICD-10-CM | POA: Diagnosis not present

## 2021-01-26 DIAGNOSIS — Z87891 Personal history of nicotine dependence: Secondary | ICD-10-CM | POA: Diagnosis not present

## 2021-01-26 DIAGNOSIS — K2950 Unspecified chronic gastritis without bleeding: Secondary | ICD-10-CM | POA: Insufficient documentation

## 2021-01-26 DIAGNOSIS — Z79890 Hormone replacement therapy (postmenopausal): Secondary | ICD-10-CM | POA: Diagnosis not present

## 2021-01-26 DIAGNOSIS — Z9181 History of falling: Secondary | ICD-10-CM | POA: Insufficient documentation

## 2021-01-26 DIAGNOSIS — B9681 Helicobacter pylori [H. pylori] as the cause of diseases classified elsewhere: Secondary | ICD-10-CM | POA: Diagnosis not present

## 2021-01-26 DIAGNOSIS — D124 Benign neoplasm of descending colon: Secondary | ICD-10-CM | POA: Diagnosis not present

## 2021-01-26 DIAGNOSIS — R197 Diarrhea, unspecified: Secondary | ICD-10-CM | POA: Insufficient documentation

## 2021-01-26 DIAGNOSIS — K6389 Other specified diseases of intestine: Secondary | ICD-10-CM | POA: Diagnosis not present

## 2021-01-26 DIAGNOSIS — D120 Benign neoplasm of cecum: Secondary | ICD-10-CM | POA: Diagnosis not present

## 2021-01-26 DIAGNOSIS — K6289 Other specified diseases of anus and rectum: Secondary | ICD-10-CM | POA: Insufficient documentation

## 2021-01-26 DIAGNOSIS — R1011 Right upper quadrant pain: Secondary | ICD-10-CM | POA: Insufficient documentation

## 2021-01-26 DIAGNOSIS — Z79899 Other long term (current) drug therapy: Secondary | ICD-10-CM | POA: Insufficient documentation

## 2021-01-26 DIAGNOSIS — Z8541 Personal history of malignant neoplasm of cervix uteri: Secondary | ICD-10-CM | POA: Diagnosis not present

## 2021-01-26 DIAGNOSIS — E669 Obesity, unspecified: Secondary | ICD-10-CM | POA: Diagnosis not present

## 2021-01-26 HISTORY — PX: COLONOSCOPY WITH PROPOFOL: SHX5780

## 2021-01-26 HISTORY — PX: ESOPHAGOGASTRODUODENOSCOPY (EGD) WITH PROPOFOL: SHX5813

## 2021-01-26 LAB — HM COLONOSCOPY

## 2021-01-26 SURGERY — ESOPHAGOGASTRODUODENOSCOPY (EGD) WITH PROPOFOL
Anesthesia: General

## 2021-01-26 MED ORDER — GLYCOPYRROLATE 0.2 MG/ML IJ SOLN
INTRAMUSCULAR | Status: DC | PRN
  Administered 2021-01-26: .2 mg via INTRAVENOUS

## 2021-01-26 MED ORDER — PROPOFOL 10 MG/ML IV BOLUS
INTRAVENOUS | Status: AC
  Filled 2021-01-26: qty 20

## 2021-01-26 MED ORDER — MIDAZOLAM HCL 2 MG/2ML IJ SOLN
INTRAMUSCULAR | Status: DC | PRN
  Administered 2021-01-26: 2 mg via INTRAVENOUS

## 2021-01-26 MED ORDER — SODIUM CHLORIDE 0.9 % IV SOLN
INTRAVENOUS | Status: DC
  Administered 2021-01-26: 20 mL/h via INTRAVENOUS

## 2021-01-26 MED ORDER — PROPOFOL 500 MG/50ML IV EMUL
INTRAVENOUS | Status: DC | PRN
  Administered 2021-01-26: 155 ug/kg/min via INTRAVENOUS

## 2021-01-26 MED ORDER — LIDOCAINE HCL (CARDIAC) PF 100 MG/5ML IV SOSY
PREFILLED_SYRINGE | INTRAVENOUS | Status: DC | PRN
  Administered 2021-01-26: 100 mg via INTRAVENOUS

## 2021-01-26 MED ORDER — MIDAZOLAM HCL 2 MG/2ML IJ SOLN
INTRAMUSCULAR | Status: AC
  Filled 2021-01-26: qty 2

## 2021-01-26 MED ORDER — PROPOFOL 10 MG/ML IV BOLUS
INTRAVENOUS | Status: DC | PRN
  Administered 2021-01-26: 40 mg via INTRAVENOUS
  Administered 2021-01-26: 20 mg via INTRAVENOUS
  Administered 2021-01-26: 60 mg via INTRAVENOUS

## 2021-01-26 NOTE — Op Note (Signed)
[facility] Patient Name: Erin Dorsey Procedure Date: 01/26/2021 9:42 AM MRN: [id number] Account #: [id number] Date of Birth: 22-Aug-1970 Admit Type: Outpatient Age: 50 Room: [facility] ENDO ROOM 1 Gender: Female Note Status: Finalized Instrument Name: Peds Colonoscope [id number] Procedure:             Colonoscopy Indications:           Clinically significant diarrhea of unexplained origin Providers:             Robert Bellow, MD Referring MD:          Jobe Marker. Einar Pheasant (Referring MD) Medicines:             Propofol per Anesthesia Complications:         No immediate complications. Procedure:             Pre-Anesthesia Assessment:                        - Prior to the procedure, a History and Physical was                         performed, and patient medications, allergies and                         sensitivities were reviewed. The patient's tolerance                         of previous anesthesia was reviewed.                        - The risks and benefits of the procedure and the                         sedation options and risks were discussed with the                         patient. All questions were answered and informed                         consent was obtained.                        After obtaining informed consent, the colonoscope was                         passed under direct vision. Throughout the procedure,                         the patient's blood pressure, pulse, and oxygen                         saturations were monitored continuously. The                         Colonoscope was introduced through the anus and                         advanced to the the cecum, identified by appendiceal  orifice and ileocecal valve. The colonoscopy was                         performed without difficulty. The patient tolerated                         the procedure well. The quality of the bowel                          preparation was excellent. Findings:      A few small-mouthed diverticula were found in the sigmoid colon.      Three sessile polyps were found in the descending colon and cecum. The       polyps were 5 to 10 mm in size. These polyps were removed with a hot       snare. Resection and retrieval were complete.      Diffuse mild inflammation characterized by mucus was found in the       rectum. Biopsies were taken with a cold forceps for histology.      Cold biopsies of the right colon for history of diarrhea included a       small,. 4 mm polyp. Random left colon biopsies were completed. Impression:            - Diverticulosis in the sigmoid colon.                        - Three 5 to 10 mm polyps in the descending colon and                         in the cecum, removed with a hot snare. Resected and                         retrieved.                        - Diffuse mild inflammation was found in the rectum.                         Biopsied. Recommendation:        - Telephone endoscopist for pathology results in 1                         week. Procedure Code(s):     --- Professional ---                        [phone], Colonoscopy, flexible; with removal of                         tumor(s), polyp(s), or other lesion(s) by snare                         technique                        [id number], 45, Colonoscopy, flexible; with biopsy, single                         or multiple Diagnosis Code(s):     --- Professional ---  K62.89, Other specified diseases of anus and rectum                        K63.5, Polyp of colon                        R19.7, Diarrhea, unspecified                        K57.30, Diverticulosis of large intestine without                         perforation or abscess without bleeding CPT copyright 2019 American Medical Association. All rights reserved. The codes documented in this report are preliminary and upon coder review may  be revised to meet current  compliance requirements. Robert Bellow, MD 01/26/2021 10:43:39 AM This report has been signed electronically. Number of Addenda: 0 Note Initiated On: 01/26/2021 9:42 AM Scope Withdrawal Time: 0 hours 20 minutes 3 seconds  Total Procedure Duration: 0 hours 25 minutes 7 seconds  Estimated Blood Loss:  Estimated blood loss was minimal.      [facility]

## 2021-01-26 NOTE — Anesthesia Procedure Notes (Signed)
Procedure Name: General with mask airway Date/Time: 01/26/2021 10:05 AM Performed by: Kelton Pillar, CRNA Pre-anesthesia Checklist: Patient identified, Emergency Drugs available, Suction available and Patient being monitored Patient Re-evaluated:Patient Re-evaluated prior to induction Oxygen Delivery Method: Simple face mask Induction Type: IV induction Placement Confirmation: positive ETCO2 and CO2 detector Dental Injury: Teeth and Oropharynx as per pre-operative assessment

## 2021-01-26 NOTE — Anesthesia Postprocedure Evaluation (Signed)
Anesthesia Post Note  Patient: Erin Dorsey  Procedure(s) Performed: ESOPHAGOGASTRODUODENOSCOPY (EGD) WITH PROPOFOL COLONOSCOPY WITH PROPOFOL  Patient location during evaluation: Endoscopy Anesthesia Type: General Level of consciousness: awake and alert and oriented Pain management: pain level controlled Vital Signs Assessment: post-procedure vital signs reviewed and stable Respiratory status: spontaneous breathing, nonlabored ventilation and respiratory function stable Cardiovascular status: blood pressure returned to baseline and stable Postop Assessment: no signs of nausea or vomiting Anesthetic complications: no   No notable events documented.   Last Vitals:  Vitals:   01/26/21 1041 01/26/21 1051  BP: (!) 110/57 104/76  Pulse:    Resp: 13 15  Temp:    SpO2:      Last Pain:  Vitals:   01/26/21 1101  TempSrc:   PainSc: 0-No pain                  

## 2021-01-26 NOTE — Transfer of Care (Signed)
Immediate Anesthesia Transfer of Care Note  Patient: Erin Dorsey  Procedure(s) Performed: ESOPHAGOGASTRODUODENOSCOPY (EGD) WITH PROPOFOL COLONOSCOPY WITH PROPOFOL  Patient Location: Endoscopy Unit  Anesthesia Type:General  Level of Consciousness: drowsy and patient cooperative  Airway & Oxygen Therapy: Patient Spontanous Breathing and Patient connected to face mask oxygen  Post-op Assessment: Report given to RN and Post -op Vital signs reviewed and stable  Post vital signs: Reviewed and stable  Last Vitals:  Vitals Value Taken Time  BP 110/57 01/26/21 1041  Temp    Pulse 88 01/26/21 1043  Resp 24 01/26/21 1043  SpO2 100 % 01/26/21 1043  Vitals shown include unvalidated device data.  Last Pain:  Vitals:   01/26/21 1041  TempSrc:   PainSc: Asleep         Complications: No notable events documented.

## 2021-01-26 NOTE — H&P (Signed)
Erin Dorsey [id number] 23-Oct-1970     HPI:  50 y/o woman with atypical abdominal pain and a change in bowel habits (mucous in the stools). For upper and lower endoscopy. Tolerated the prep well. (Mild nausea.)  Medications Prior to Admission  Medication Sig Dispense Refill Last Dose   busPIRone (BUSPAR) 15 MG tablet TAKE 1 TABLET BY MOUTH 3 TIMES DAILY. 270 tablet 0 01/26/2021 at 0100   cetirizine (ZYRTEC) 10 MG tablet TAKE 1 TABLET BY MOUTH EVERY DAY 90 tablet 0 01/25/2021   Chlorphen-PE-Acetaminophen (NOREL AD) 4-10-325 MG TABS Take 1 tablet by mouth every 4 (four) hours as needed (nasal congestion, cold symptoms). 20 tablet 1 01/25/2021   cyclobenzaprine (FLEXERIL) 10 MG tablet TAKE 1/2 TO 1 TABLETS BY MOUTH AT BEDTIME. Needs appt with PCP for further refills. 30 tablet 0 01/25/2021   FLUoxetine HCl 60 MG TABS Take 60 mg by mouth daily. Needs appt with PCP for further refills. 90 tablet 0 01/25/2021   gabapentin (NEURONTIN) 300 MG capsule Take 300 mg by mouth 3 (three) times daily.   01/25/2021   Hydrocortisone Acetate 1 % CREA Apply 1 application topically daily as needed (heat rash).   01/25/2021   hydrOXYzine (ATARAX/VISTARIL) 25 MG tablet Take 1 tablet (25 mg total) by mouth every 8 (eight) hours as needed. Needs appt with PCP for further refills. 90 tablet 0 01/25/2021   Lidocaine (ZTLIDO) 1.8 % PTCH Apply topically daily.   01/25/2021   methocarbamol (ROBAXIN) 500 MG tablet Take 1 tablet by mouth every 8 (eight) hours as needed.   01/25/2021   naloxone (NARCAN) nasal spray 4 mg/0.1 mL Place 1 spray into the nose as needed.   Past Month   nitrofurantoin, macrocrystal-monohydrate, (MACROBID) 100 MG capsule Take 1 capsule (100 mg total) by mouth 2 (two) times daily. 10 capsule 0 01/25/2021   nystatin (MYCOSTATIN/NYSTOP) powder APPLY TO AFFECTED AREA 3 TIMES A DAY 15 g 5 01/25/2021   oxybutynin (DITROPAN-XL) 10 MG 24 hr tablet TAKE 1 TABLET BY MOUTH EVERYDAY AT BEDTIME 30 tablet 2 01/26/2021 at  0100   oxyCODONE-acetaminophen (PERCOCET/ROXICET) 5-325 MG tablet Take by mouth every 4 (four) hours as needed for severe pain.   01/25/2021   traMADol (ULTRAM) 50 MG tablet Take 50 mg by mouth 3 (three) times daily as needed.   01/25/2021   amitriptyline (ELAVIL) 25 MG tablet Take 1 tablet (25 mg total) by mouth at bedtime. 90 tablet 1    diclofenac (VOLTAREN) 75 MG EC tablet Take 75 mg by mouth 2 (two) times daily as needed.      estradiol (ESTRACE) 1 MG tablet Take 1 tablet (1 mg total) by mouth daily. 90 tablet 0    Allergies  Allergen Reactions   Amoxicillin Rash   Penicillins Rash   Past Medical History:  Diagnosis Date   Allergy    Anxiety    Cervical cancer ([place]) 2011   stage 3   Chronic headaches    Depression    Frequent falls    GERD (gastroesophageal reflux disease)    Lumbar radiculopathy    Past Surgical History:  Procedure Laterality Date   ANTERIOR AND POSTERIOR REPAIR N/A 04/12/2020   Procedure: ANTERIOR (CYSTOCELE) AND POSTERIOR REPAIR (RECTOCELE);  Surgeon: Harlin Heys, MD;  Location: [facility] ORS;  Service: Gynecology;  Laterality: N/A;   BLADDER SUSPENSION N/A 04/12/2020   Procedure: [facility] PROCEDURE;  Surgeon: Harlin Heys, MD;  Location: [facility] ORS;  Service: Gynecology;  Laterality: N/A;  TOT   COLPOSCOPY  2011 or 2012   stage 3-removed cancerous spot on the cervix   EXCISION OF TONGUE LESION N/A 10/15/2019   Procedure: EXCISION OF TONGUE LESION;  Surgeon: Clyde Canterbury, MD;  Location: [facility] ORS;  Service: ENT;  Laterality: N/A;   FOOT SURGERY Right    x 2-took knots out from bottom of the foot-not sure of the technical name   [place] Bilateral 04/12/2020   Procedure: [place];  Surgeon: Harlin Heys, MD;  Location: [facility] ORS;  Service: Gynecology;  Laterality: Bilateral;   PAROTIDECTOMY Right 10/15/2019   Procedure: PAROTIDECTOMY;   Surgeon: Clyde Canterbury, MD;  Location: [facility] ORS;  Service: ENT;  Laterality: Right;   TUBAL LIGATION     Social History   Socioeconomic History   Marital status: Married    Spouse name: Erin Dorsey   Number of children: 3   Years of education: 7th grade   Highest education level: Not on file  Occupational History   Not on file  Tobacco Use   Smoking status: Former    Packs/day: 0.25    Years: 20.00    Pack years: 5.00    Types: Cigarettes    Quit date: 09/01/2019    Years since quitting: 1.4   Smokeless tobacco: Never   Tobacco comments:    not daily  Vaping Use   Vaping Use: Never used  Substance and Sexual Activity   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   Drug use: Yes    Frequency: 1.0 times per week    Types: Marijuana    Comment: monthly   Sexual activity: Yes    Birth control/protection: Surgical    Comment: tubal ligation  Other Topics Concern   Not on file  Social History Narrative   08/06/19   From: [place], came here to be close to daughter   Living: with husband Erin Dorsey together since 2011 and married since 2021   Work: not currently, use to work at [employer]: 3 daughter - in [place] - ok relationship - 1 grandchild / with Erin Dorsey has 18 step grandchildren      Enjoys: walk, watch movies, painting, cross stitch      Exercise: walking   Diet: eats whatever      Safety   Seat belts: Yes    Guns: No   Safe in relationships: Yes    Social Determinants of Radio broadcast assistant Strain: Not on file  Food Insecurity: Not on file  Transportation Needs: Not on file  Physical Activity: Not on file  Stress: Not on file  Social Connections: Not on file  Intimate Partner Violence: Not on file   Social History   Social History Narrative   08/06/19   From: [place], came here to be close to daughter   Living: with husband Erin Dorsey together since 2011 and married since 2021   Work: not currently, use to work at [employer]:  3 daughter - in [place] - ok relationship - 1 grandchild / with Erin Dorsey has 18 step grandchildren      Enjoys: walk, watch movies, painting, cross stitch      Exercise: walking   Diet: eats whatever      Safety   Seat belts: Yes    Guns: No   Safe in relationships: Yes      ROS:  Negative.     PE: HEENT: Negative. Lungs: Clear. Cardio: RR.   Assessment/Plan:  Proceed with planned upper and lower endoscopy.  Forest Gleason [facility] 01/26/2021

## 2021-01-26 NOTE — Op Note (Signed)
[facility] Gastroenterology Patient Name: Erin Dorsey Procedure Date: 01/26/2021 9:42 AM MRN: [id number] Account #: [id number] Date of Birth: [birth year] Admit Type: Outpatient Age: 50 Room: [facility] ENDO ROOM 1 Gender: Female Note Status: Finalized Instrument Name: Upper Endoscope [id number] Procedure:             Upper GI endoscopy Indications:           Epigastric abdominal pain, Abdominal pain in the right                         upper quadrant Providers:             Robert Bellow, MD Referring MD:          Jobe Marker. Einar Pheasant (Referring MD) Medicines:             Propofol per Anesthesia Complications:         No immediate complications. Procedure:             Pre-Anesthesia Assessment:                        - Prior to the procedure, a History and Physical was                         performed, and patient medications, allergies and                         sensitivities were reviewed. The patient's tolerance                         of previous anesthesia was reviewed.                        - The risks and benefits of the procedure and the                         sedation options and risks were discussed with the                         patient. All questions were answered and informed                         consent was obtained.                        After obtaining informed consent, the endoscope was                         passed under direct vision. Throughout the procedure,                         the patient's blood pressure, pulse, and oxygen                         saturations were monitored continuously. The Endoscope                         was introduced through the mouth, and advanced to the  second part of duodenum. The upper GI endoscopy was                         accomplished without difficulty. The patient tolerated                         the procedure well. Findings:      The esophagus was normal.      Scattered  minimal inflammation characterized by erythema was found in       the gastric antrum. Biopsies were taken with a cold forceps for       histology.      The examined duodenum was normal. Impression:            - Normal esophagus.                        - Chronic gastritis. Biopsied.                        - Normal examined duodenum. Recommendation:        - Perform a colonoscopy today.                        - Telephone endoscopist for pathology results in 1                         week. Procedure Code(s):     --- Professional ---                        [phone], Esophagogastroduodenoscopy, flexible,                         transoral; with biopsy, single or multiple Diagnosis Code(s):     --- Professional ---                        K29.50, Unspecified chronic gastritis without bleeding                        R10.13, Epigastric pain                        R10.11, Right upper quadrant pain CPT copyright 2019 American Medical Association. All rights reserved. The codes documented in this report are preliminary and upon coder review may  be revised to meet current compliance requirements. Robert Bellow, MD 01/26/2021 10:10:11 AM This report has been signed electronically. Number of Addenda: 0 Note Initiated On: 01/26/2021 9:42 AM Estimated Blood Loss:  Estimated blood loss: none.      [facility] [address]

## 2021-01-26 NOTE — Anesthesia Preprocedure Evaluation (Signed)
Anesthesia Evaluation  Patient identified by MRN, date of birth, ID band Patient awake    Reviewed: Allergy & Precautions, NPO status , Patient's Chart, lab work & pertinent test results  History of Anesthesia Complications Negative for: history of anesthetic complications  Airway Mallampati: II  TM Distance: >3 FB Neck ROM: Full    Dental  (+) Edentulous Upper, Edentulous Lower   Pulmonary neg sleep apnea, neg COPD, former smoker,    breath sounds clear to auscultation- rhonchi (-) wheezing      Cardiovascular hypertension, (-) CAD, (-) Past MI, (-) Cardiac Stents and (-) CABG  Rhythm:Regular Rate:Normal - Systolic murmurs and - Diastolic murmurs    Neuro/Psych  Headaches, neg Seizures PSYCHIATRIC DISORDERS Anxiety Depression    GI/Hepatic Neg liver ROS, GERD  ,  Endo/Other  negative endocrine ROSneg diabetes  Renal/GU negative Renal ROS     Musculoskeletal negative musculoskeletal ROS (+)   Abdominal (+) + obese,   Peds  Hematology negative hematology ROS (+)   Anesthesia Other Findings Past Medical History: No date: Allergy No date: Anxiety 2011: Cervical cancer ([place])     Comment:  stage 3 No date: Chronic headaches No date: Depression No date: Frequent falls No date: GERD (gastroesophageal reflux disease) No date: Lumbar radiculopathy   Reproductive/Obstetrics                             Anesthesia Physical Anesthesia Plan  ASA: 3  Anesthesia Plan: General   Post-op Pain Management:    Induction: Intravenous  PONV Risk Score and Plan: 2 and Propofol infusion  Airway Management Planned: Natural Airway  Additional Equipment:   Intra-op Plan:   Post-operative Plan:   Informed Consent: I have reviewed the patients History and Physical, chart, labs and discussed the procedure including the risks, benefits and alternatives for the proposed anesthesia with the patient or  authorized representative who has indicated his/her understanding and acceptance.     Dental advisory given  Plan Discussed with: CRNA and Anesthesiologist  Anesthesia Plan Comments:         Anesthesia Quick Evaluation

## 2021-01-27 ENCOUNTER — Encounter: Payer: Self-pay | Admitting: General Surgery

## 2021-01-27 LAB — SURGICAL PATHOLOGY

## 2021-02-01 DIAGNOSIS — M5416 Radiculopathy, lumbar region: Secondary | ICD-10-CM | POA: Diagnosis not present

## 2021-02-01 DIAGNOSIS — M461 Sacroiliitis, not elsewhere classified: Secondary | ICD-10-CM | POA: Diagnosis not present

## 2021-02-01 DIAGNOSIS — M545 Low back pain, unspecified: Secondary | ICD-10-CM | POA: Diagnosis not present

## 2021-02-01 DIAGNOSIS — M7061 Trochanteric bursitis, right hip: Secondary | ICD-10-CM | POA: Diagnosis not present

## 2021-02-08 ENCOUNTER — Encounter: Payer: BC Managed Care – PPO | Admitting: Obstetrics and Gynecology

## 2021-02-08 DIAGNOSIS — M545 Low back pain, unspecified: Secondary | ICD-10-CM | POA: Diagnosis not present

## 2021-02-08 DIAGNOSIS — M5416 Radiculopathy, lumbar region: Secondary | ICD-10-CM | POA: Diagnosis not present

## 2021-02-17 ENCOUNTER — Other Ambulatory Visit: Payer: Self-pay | Admitting: Family Medicine

## 2021-02-17 ENCOUNTER — Telehealth: Payer: BC Managed Care – PPO | Admitting: Family Medicine

## 2021-02-18 NOTE — Telephone Encounter (Signed)
Last office visit 08/17/2020 for GAD & Depression.  Last refilled 01/07/2021 for #30 with no refills.  Next Appt: 03/03/2021.

## 2021-02-21 ENCOUNTER — Ambulatory Visit: Payer: BC Managed Care – PPO | Admitting: Family Medicine

## 2021-02-22 DIAGNOSIS — R197 Diarrhea, unspecified: Secondary | ICD-10-CM | POA: Diagnosis not present

## 2021-02-22 DIAGNOSIS — A048 Other specified bacterial intestinal infections: Secondary | ICD-10-CM | POA: Diagnosis not present

## 2021-02-28 DIAGNOSIS — M549 Dorsalgia, unspecified: Secondary | ICD-10-CM | POA: Diagnosis not present

## 2021-02-28 DIAGNOSIS — M5416 Radiculopathy, lumbar region: Secondary | ICD-10-CM | POA: Diagnosis not present

## 2021-02-28 DIAGNOSIS — G894 Chronic pain syndrome: Secondary | ICD-10-CM | POA: Diagnosis not present

## 2021-03-01 DIAGNOSIS — M5416 Radiculopathy, lumbar region: Secondary | ICD-10-CM | POA: Diagnosis not present

## 2021-03-03 ENCOUNTER — Telehealth: Payer: BC Managed Care – PPO | Admitting: Family Medicine

## 2021-03-03 ENCOUNTER — Ambulatory Visit: Payer: BC Managed Care – PPO | Admitting: Family Medicine

## 2021-03-07 ENCOUNTER — Telehealth: Payer: BC Managed Care – PPO | Admitting: Family Medicine

## 2021-03-07 ENCOUNTER — Telehealth: Payer: Self-pay

## 2021-03-07 ENCOUNTER — Other Ambulatory Visit: Payer: Self-pay

## 2021-03-07 NOTE — Telephone Encounter (Signed)
[place] Night - Client Nonclinical Telephone Record  [facility] Client [place] Night - Client Client Site [place] Physician Waunita Schooner- MD Contact Type Call Who Is Calling Patient / Member / Family / Caregiver Caller Name [place] Phone Number [phone] Patient Name Erin Dorsey Patient DOB May 04, 1970 Call Type Message Only Information Provided Reason for Call Request to Reschedule Office Appointment Initial Comment Caller states she wants to rescheduled her 8am appointment. Patient request to speak to RN No Additional Comment Please call as soon as possible. Disp. Time Disposition Final User 03/07/2021 5:27:24 AM General Information Provided Yes Clydene Laming, Amy Call Closed By: Rogers Lions Transaction Date/Time: 03/07/2021 5:25:21 AM (ET

## 2021-03-08 NOTE — Progress Notes (Signed)
Error/disregard

## 2021-03-09 ENCOUNTER — Encounter: Payer: Self-pay | Admitting: Family Medicine

## 2021-03-09 NOTE — Telephone Encounter (Signed)
Please triage

## 2021-03-11 ENCOUNTER — Encounter: Payer: Self-pay | Admitting: Family Medicine

## 2021-03-14 ENCOUNTER — Other Ambulatory Visit: Payer: Self-pay | Admitting: Obstetrics and Gynecology

## 2021-03-14 DIAGNOSIS — N3941 Urge incontinence: Secondary | ICD-10-CM

## 2021-03-17 ENCOUNTER — Other Ambulatory Visit: Payer: Self-pay | Admitting: Family Medicine

## 2021-03-17 DIAGNOSIS — F411 Generalized anxiety disorder: Secondary | ICD-10-CM

## 2021-03-17 DIAGNOSIS — F331 Major depressive disorder, recurrent, moderate: Secondary | ICD-10-CM

## 2021-03-18 MED ORDER — FLUOXETINE HCL 60 MG PO TABS: 60.0000 mg | ORAL_TABLET | Freq: Every day | ORAL | 0 refills | Status: DC

## 2021-03-23 ENCOUNTER — Other Ambulatory Visit: Payer: Self-pay | Admitting: Family Medicine

## 2021-03-23 ENCOUNTER — Encounter: Payer: Self-pay | Admitting: Family Medicine

## 2021-03-23 NOTE — Telephone Encounter (Signed)
I spoke with pt; pt had neck surgery to remove a knot 2021. Pt said another knot has come up on rt side of face under chin and near rt ear. Pt said face is swollen but throat is not swollen. Pt said bad S/T that really hurts to swallow on 03/22/21; pt has prod cough with yellow phlegm that started on 03/21/21; pt is not sure if has fever or not; has not taken temp but sometimes she feels really hot and then feels really cold. Pt has H/A with pain level now of 10. Pt has SOB with exertion and pt is wheezing. No CP. Pt started couple of days ago with vomiting and watery diarrhea; last vomiting and diarrhea was this morning; pt has dry mouth, abd pain at waist line and dizziness. Concerned with multiple symptoms and possible dehydration. Pt is going to [facility] ED for eval and any needed testing. Sending note to Dr Lorelei Pont for [place] since Dr Einar Pheasant and Gentry Fitz NP is out of office.also sending note to [facility] CMA who asked med to triage pt.

## 2021-03-23 NOTE — Telephone Encounter (Signed)
This definitely needs asap eval, and I think a higher level of care makes some sense.

## 2021-03-24 ENCOUNTER — Encounter: Payer: Self-pay | Admitting: Family Medicine

## 2021-03-28 DIAGNOSIS — M549 Dorsalgia, unspecified: Secondary | ICD-10-CM | POA: Diagnosis not present

## 2021-03-28 DIAGNOSIS — M5416 Radiculopathy, lumbar region: Secondary | ICD-10-CM | POA: Diagnosis not present

## 2021-03-28 DIAGNOSIS — A048 Other specified bacterial intestinal infections: Secondary | ICD-10-CM | POA: Diagnosis not present

## 2021-03-30 ENCOUNTER — Other Ambulatory Visit: Payer: Self-pay | Admitting: Family Medicine

## 2021-03-31 DIAGNOSIS — M5416 Radiculopathy, lumbar region: Secondary | ICD-10-CM | POA: Diagnosis not present

## 2021-04-10 ENCOUNTER — Other Ambulatory Visit: Payer: Self-pay | Admitting: Family Medicine

## 2021-04-11 ENCOUNTER — Other Ambulatory Visit: Payer: Self-pay

## 2021-04-11 ENCOUNTER — Other Ambulatory Visit: Payer: Self-pay | Admitting: Family Medicine

## 2021-04-11 DIAGNOSIS — B009 Herpesviral infection, unspecified: Secondary | ICD-10-CM

## 2021-04-11 MED ORDER — VALACYCLOVIR HCL 500 MG PO TABS: 500.0000 mg | ORAL_TABLET | Freq: Two times a day (BID) | ORAL | 3 refills | Status: AC

## 2021-04-11 NOTE — Telephone Encounter (Signed)
Refills refused.

## 2021-04-20 ENCOUNTER — Other Ambulatory Visit: Payer: Self-pay | Admitting: Family Medicine

## 2021-04-25 ENCOUNTER — Other Ambulatory Visit: Payer: Self-pay | Admitting: Family Medicine

## 2021-04-25 DIAGNOSIS — F331 Major depressive disorder, recurrent, moderate: Secondary | ICD-10-CM

## 2021-04-25 DIAGNOSIS — F411 Generalized anxiety disorder: Secondary | ICD-10-CM

## 2021-04-26 ENCOUNTER — Encounter: Payer: Self-pay | Admitting: Family

## 2021-04-27 ENCOUNTER — Encounter: Payer: Self-pay | Admitting: Family

## 2021-04-27 ENCOUNTER — Other Ambulatory Visit: Payer: Self-pay

## 2021-04-27 ENCOUNTER — Telehealth (INDEPENDENT_AMBULATORY_CARE_PROVIDER_SITE_OTHER): Payer: BC Managed Care – PPO | Admitting: Family

## 2021-04-27 ENCOUNTER — Other Ambulatory Visit: Payer: Self-pay | Admitting: Family Medicine

## 2021-04-27 VITALS — BP 147/90 | HR 102 | Ht 69.0 in | Wt 258.0 lb

## 2021-04-27 DIAGNOSIS — F419 Anxiety disorder, unspecified: Secondary | ICD-10-CM

## 2021-04-27 DIAGNOSIS — L304 Erythema intertrigo: Secondary | ICD-10-CM | POA: Diagnosis not present

## 2021-04-27 DIAGNOSIS — K8020 Calculus of gallbladder without cholecystitis without obstruction: Secondary | ICD-10-CM | POA: Insufficient documentation

## 2021-04-27 DIAGNOSIS — G8929 Other chronic pain: Secondary | ICD-10-CM | POA: Insufficient documentation

## 2021-04-27 DIAGNOSIS — N393 Stress incontinence (female) (male): Secondary | ICD-10-CM

## 2021-04-27 DIAGNOSIS — I10 Essential (primary) hypertension: Secondary | ICD-10-CM | POA: Diagnosis not present

## 2021-04-27 DIAGNOSIS — F411 Generalized anxiety disorder: Secondary | ICD-10-CM

## 2021-04-27 DIAGNOSIS — F410 Panic disorder [episodic paroxysmal anxiety] without agoraphobia: Secondary | ICD-10-CM

## 2021-04-27 DIAGNOSIS — F331 Major depressive disorder, recurrent, moderate: Secondary | ICD-10-CM

## 2021-04-27 MED ORDER — HYDROCODONE-ACETAMINOPHEN 5-325 MG PO TABS: 1.0000 | ORAL_TABLET | Freq: Four times a day (QID) | ORAL | 0 refills | Status: DC | PRN

## 2021-04-27 MED ORDER — FLUOXETINE HCL 60 MG PO TABS: 60.0000 mg | ORAL_TABLET | Freq: Every day | ORAL | 0 refills | Status: DC

## 2021-04-27 MED ORDER — BUSPIRONE HCL 15 MG PO TABS: 15.0000 mg | ORAL_TABLET | Freq: Three times a day (TID) | ORAL | 0 refills | Status: DC

## 2021-04-27 MED ORDER — TRAMADOL HCL 50 MG PO TABS: ORAL_TABLET | ORAL | 0 refills | Status: DC

## 2021-04-27 MED ORDER — HYDROXYZINE HCL 25 MG PO TABS: 25.0000 mg | ORAL_TABLET | Freq: Three times a day (TID) | ORAL | 0 refills | Status: AC | PRN

## 2021-04-27 NOTE — Assessment & Plan Note (Signed)
Patient to continue oxybutynin as well as follow-up with her urologist as scheduled.

## 2021-04-27 NOTE — Assessment & Plan Note (Signed)
Patient advised over the next 1 week to check her blood pressure daily, advised her to keep her flat feet flat on the floor for at least 5 minutes and then to take her blood pressure with her machine.  Advised patient to keep a blood pressure log and follow-up in 1 week with her findings.  We may consider adding blood pressure medication.  Suspect that headaches are related to elevated blood pressure.

## 2021-04-27 NOTE — Assessment & Plan Note (Signed)
Continue follow-up with surgeon as scheduled, follow-up appointment in January.  Continue gallbladder healthy diet

## 2021-04-27 NOTE — Assessment & Plan Note (Signed)
Long discussion with patient on her muscle relaxant intake as she is currently taking 1 muscle relaxer (robaxin) in the morning as well as another in the afternoon (flexeril) per her own direction.  Advised patient that this was not safe, and not as directed and patient is to follow-up with her pain management to see if he even wants her taking muscle relaxers at this time due to her also taking Norco as well as tramadol.  Patient states she has a follow-up with him in January and will discuss with him then.

## 2021-04-27 NOTE — Assessment & Plan Note (Addendum)
Refills provided for fluoxetine 60 mg once p.o. daily as well as BuSpar 15 mg to take 1 p.o. 3 times daily.  If any SI/HI please report immediately.  Refill also sent for hydroxyzine 25 mg tablet to take as needed for anxiety attacks as patient find these helpful

## 2021-04-27 NOTE — Assessment & Plan Note (Signed)
Stable and controlled on current, nystatin as needed.

## 2021-04-27 NOTE — Patient Instructions (Addendum)
Start monitoring your blood pressure daily, around the same time of day, for the next 2-3 weeks.  Ensure that you have rested for 30 minutes prior to checking your blood pressure. Record your readings and bring them to your next visit.  I want you to follow up with pain management in regards to usage of your muscle relaxers.  You should not be taking 2 different muscle relaxers daily along with your pain medication.  Continue fluoxetine 60 mg once daily as well as buspirone 3 times a day.  I look forward to following up with you in the next week, be sure to bring your blood pressure log along with you and try to come fasting to the visit so that we can consider labs if needed.  Fasting means only black coffee and/or water prior to the appointment.  It was a pleasure seeing you today! Please do not hesitate to reach out with any questions and or concerns.  Regards,   Eugenia Pancoast FNP-C

## 2021-04-27 NOTE — Progress Notes (Signed)
MyChart Video Visit    Virtual Visit via Video Note   This visit type was conducted due to national recommendations for restrictions regarding the COVID-19 Pandemic (e.g. social distancing) in an effort to limit this patient's exposure and mitigate transmission in our community. This patient is at least at moderate risk for complications without adequate follow up. This format is felt to be most appropriate for this patient at this time. Physical exam was limited by quality of the video and audio technology used for the visit. CMA was able to get the patient set up on a video visit.  Patient location: Home. Patient and provider in visit Provider location: Office  I discussed the limitations of evaluation and management by telemedicine and the availability of in person appointments. The patient expressed understanding and agreed to proceed.  Visit Date: 04/27/2021  Today's healthcare provider: Eugenia Pancoast, FNP     Subjective:    Patient ID: Erin Dorsey, female    DOB: 1970-08-24, 50 y.o.   MRN: [id number]  Chief Complaint  Patient presents with   Medication Refill    HPI 50 y/o here today for f/u and medication refills.  Stress incontinence, sees urology. On oxybutynin.   Chronic pain: right hip, pelvic perineal pain: sees pain management Dr. Laroy Apple once a month. Tramadol 50 mg, only rarely, doesn't take daily. Takes norco about three times daily on average 5/325 mg. She is asking today for refill flexeril, she states she is currently taking robaxin 500 mg QAM and flexeril 10 mg QHS. She states she got these from two different doctors at different times, and she does not get these from her pain management doctor.   HTN: pt not currently on any medication, today her blood pressure at home was elevated, reported at 147/90. Denies blurry vision but does have headaches/dull achy over the last few weeks. She denies lower ext edema. Denies cp palp and or  sob.  Depression: currently taking fluoxetine 60 mg po qd as well as buspar 15 mg tid. She likes this current regimen, and states her depressive symptoms are well controlled. Denies si /hi.  General surgeon, Dr. Ilona Sorrel, has f/u in January. Cholecystitis, without cholecystectomy. prior h pylori infection, treated. Upper EGD 01/27/21 showed gastritis, colonscopy also same day with mucous and mild inflammation as well as diverticula and polyps. Per pt polyps were benign. Was told to stop taking ppi for two weeks, due for f/u and they will rediscuss.  Past Medical History:  Diagnosis Date   Allergy    Anxiety    Cervical cancer ([place]) 2011   stage 3   Chronic headaches    Depression    Frequent falls    GERD (gastroesophageal reflux disease)    Lumbar radiculopathy     Past Surgical History:  Procedure Laterality Date   ANTERIOR AND POSTERIOR REPAIR N/A 04/12/2020   Procedure: ANTERIOR (CYSTOCELE) AND POSTERIOR REPAIR (RECTOCELE);  Surgeon: Harlin Heys, MD;  Location: [facility] ORS;  Service: Gynecology;  Laterality: N/A;   BLADDER SUSPENSION N/A 04/12/2020   Procedure: [facility] PROCEDURE;  Surgeon: Harlin Heys, MD;  Location: [facility] ORS;  Service: Gynecology;  Laterality: N/A;  TOT   COLONOSCOPY WITH PROPOFOL N/A 01/26/2021   Procedure: COLONOSCOPY WITH PROPOFOL;  Surgeon: Robert Bellow, MD;  Location: [facility] ENDOSCOPY;  Service: Endoscopy;  Laterality: N/A;   COLPOSCOPY  2011 or 2012   stage 3-removed cancerous spot on the cervix   ESOPHAGOGASTRODUODENOSCOPY (EGD) WITH PROPOFOL  N/A 01/26/2021   Procedure: ESOPHAGOGASTRODUODENOSCOPY (EGD) WITH PROPOFOL;  Surgeon: Robert Bellow, MD;  Location: [facility] ENDOSCOPY;  Service: Endoscopy;  Laterality: N/A;   EXCISION OF TONGUE LESION N/A 10/15/2019   Procedure: EXCISION OF TONGUE LESION;  Surgeon: Clyde Canterbury, MD;  Location: [facility] ORS;  Service: ENT;  Laterality: N/A;   FOOT SURGERY Right    x 2-took knots out from bottom of the  foot-not sure of the technical name   [place] Bilateral 04/12/2020   Procedure: [place];  Surgeon: Harlin Heys, MD;  Location: [facility] ORS;  Service: Gynecology;  Laterality: Bilateral;   PAROTIDECTOMY Right 10/15/2019   Procedure: PAROTIDECTOMY;  Surgeon: Clyde Canterbury, MD;  Location: [facility] ORS;  Service: ENT;  Laterality: Right;   TUBAL LIGATION      Family History  Problem Relation Age of Onset   Hypertension Mother    Kidney disease Father        was on dialysis   Uterine cancer Sister    Cervical cancer Sister    Deafness Daughter    Cervical cancer Maternal Grandmother    Lung cancer Maternal Grandfather    Brain cancer Maternal Grandfather    Thyroid disease Daughter    Breast cancer Paternal Aunt     Social History   Socioeconomic History   Marital status: Married    Spouse name: [name]   Number of children: 3   Years of education: 7th grade   Highest education level: Not on file  Occupational History   Not on file  Tobacco Use   Smoking status: Former    Packs/day: 0.25    Years: 20.00    Pack years: 5.00    Types: Cigarettes    Quit date: 09/01/2019    Years since quitting: 1.6   Smokeless tobacco: Never   Tobacco comments:    not daily  Vaping Use   Vaping Use: Never used  Substance and Sexual Activity   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   Drug use: Yes    Frequency: 1.0 times per week    Types: Marijuana    Comment: monthly   Sexual activity: Yes    Birth control/protection: Surgical    Comment: tubal ligation  Other Topics Concern   Not on file  Social History Narrative   08/06/19   From: [place], came here to be close to daughter   Living: with husband [name] together since 2011 and married since 2021   Work: not currently, use to work at [employer]: 3 daughter - in [place] - ok relationship - 1 grandchild  / with [name] has 18 step grandchildren      Enjoys: walk, watch movies, painting, cross stitch      Exercise: walking   Diet: eats whatever      Safety   Seat belts: Yes    Guns: No   Safe in relationships: Yes    Social Determinants of Radio broadcast assistant Strain: Not on file  Food Insecurity: Not on file  Transportation Needs: Not on file  Physical Activity: Not on file  Stress: Not on file  Social Connections: Not on file  Intimate Partner Violence: Not on file    Outpatient Medications Prior to Visit  Medication Sig Dispense Refill   cetirizine (ZYRTEC) 10 MG tablet TAKE 1 TABLET BY MOUTH EVERY DAY 90 tablet 0  cyclobenzaprine (FLEXERIL) 10 MG tablet TAKE 1/2 TO 1 TABLETS BY MOUTH AT BEDTIME. NEEDS APPT WITH PCP FOR FURTHER REFILLS. 30 tablet 0   gabapentin (NEURONTIN) 300 MG capsule Take 300 mg by mouth 3 (three) times daily.     Hydrocortisone Acetate 1 % CREA Apply 1 application topically daily as needed (heat rash).     Lidocaine (ZTLIDO) 1.8 % PTCH Apply topically daily.     naloxone (NARCAN) nasal spray 4 mg/0.1 mL Place 1 spray into the nose as needed.     nystatin (MYCOSTATIN/NYSTOP) powder APPLY TO AFFECTED AREA 3 TIMES A DAY 15 g 5   oxybutynin (DITROPAN-XL) 10 MG 24 hr tablet TAKE 1 TABLET BY MOUTH EVERYDAY AT BEDTIME 90 tablet 1   busPIRone (BUSPAR) 15 MG tablet TAKE 1 TABLET BY MOUTH 3 TIMES DAILY. 270 tablet 0   Chlorphen-PE-Acetaminophen (NOREL AD) 4-10-325 MG TABS Take 1 tablet by mouth every 4 (four) hours as needed (nasal congestion, cold symptoms). 20 tablet 1   diclofenac (VOLTAREN) 75 MG EC tablet Take 75 mg by mouth 2 (two) times daily as needed.     FLUoxetine HCl 60 MG TABS Take 60 mg by mouth daily. Needs appt with PCP for further refills. 90 tablet 0   hydrOXYzine (ATARAX/VISTARIL) 25 MG tablet Take 1 tablet (25 mg total) by mouth every 8 (eight) hours as needed. Needs appt with PCP for further refills. 90 tablet 0   methocarbamol  (ROBAXIN) 500 MG tablet Take 1 tablet by mouth every 8 (eight) hours as needed.     nitrofurantoin, macrocrystal-monohydrate, (MACROBID) 100 MG capsule Take 1 capsule (100 mg total) by mouth 2 (two) times daily. 10 capsule 0   oxyCODONE-acetaminophen (PERCOCET/ROXICET) 5-325 MG tablet Take by mouth every 4 (four) hours as needed for severe pain.     traMADol (ULTRAM) 50 MG tablet Take 50 mg by mouth 3 (three) times daily as needed.     amitriptyline (ELAVIL) 25 MG tablet Take 1 tablet (25 mg total) by mouth at bedtime. 90 tablet 1   estradiol (ESTRACE) 1 MG tablet Take 1 tablet (1 mg total) by mouth daily. 90 tablet 0   No facility-administered medications prior to visit.    Allergies  Allergen Reactions   Amoxicillin Rash   Penicillins Rash    Review of Systems  Constitutional:  Negative for chills, fever and weight loss.  Eyes:  Negative for blurred vision.  Respiratory:  Negative for cough, shortness of breath and wheezing.   Cardiovascular:  Negative for chest pain, palpitations and leg swelling.  Gastrointestinal:  Negative for constipation and diarrhea.  Musculoskeletal:  Positive for joint pain (chronic pain).  Neurological:  Positive for headaches (dull achy). Negative for dizziness and weakness.  Psychiatric/Behavioral:  Negative for depression and suicidal ideas.       Objective:    Physical Exam Constitutional:      Appearance: Normal appearance. She is obese.  HENT:     Head: Normocephalic.  Pulmonary:     Effort: Pulmonary effort is normal.  Neurological:     General: No focal deficit present.     Mental Status: She is alert and oriented to person, place, and time.  Psychiatric:        Mood and Affect: Mood normal.        Behavior: Behavior normal.        Thought Content: Thought content normal.        Judgment: Judgment normal.    BP (!) 147/90  Pulse (!) 102    Ht 5\' 9"  (1.753 m)    Wt 258 lb (117 kg)    LMP 06/23/2015    BMI 38.10 kg/m  Wt Readings  from Last 3 Encounters:  04/27/21 258 lb (117 kg)  01/26/21 258 lb (117 kg)  10/07/20 236 lb 14.4 oz (107.5 kg)       Assessment & Plan:   Problem List Items Addressed This Visit       Cardiovascular and Mediastinum   Essential hypertension    Patient advised over the next 1 week to check her blood pressure daily, advised her to keep her flat feet flat on the floor for at least 5 minutes and then to take her blood pressure with her machine.  Advised patient to keep a blood pressure log and follow-up in 1 week with her findings.  We may consider adding blood pressure medication.  Suspect that headaches are related to elevated blood pressure.        Digestive   Calculus of gallbladder without cholecystitis without obstruction    Continue follow-up with surgeon as scheduled, follow-up appointment in January.  Continue gallbladder healthy diet        Musculoskeletal and Integument   Intertrigo - Primary    Stable and controlled on current, nystatin as needed.        Other   Stress incontinence    Patient to continue oxybutynin as well as follow-up with her urologist as scheduled.      Recurrent moderate major depressive disorder with anxiety ([place])    Refills provided for fluoxetine 60 mg once p.o. daily as well as BuSpar 15 mg to take 1 p.o. 3 times daily.  If any SI/HI please report immediately.  Refill also sent for hydroxyzine 25 mg tablet to take as needed for anxiety attacks as patient find these helpful      Relevant Medications   FLUoxetine HCl 60 MG TABS   busPIRone (BUSPAR) 15 MG tablet   hydrOXYzine (ATARAX) 25 MG tablet   Other chronic pain    Long discussion with patient on her muscle relaxant intake as she is currently taking 1 muscle relaxer (robaxin) in the morning as well as another in the afternoon (flexeril) per her own direction.  Advised patient that this was not safe, and not as directed and patient is to follow-up with her pain management to see if he  even wants her taking muscle relaxers at this time due to her also taking Norco as well as tramadol.  Patient states she has a follow-up with him in January and will discuss with him then.      Relevant Medications   HYDROcodone-acetaminophen (NORCO) 5-325 MG tablet   traMADol (ULTRAM) 50 MG tablet   FLUoxetine HCl 60 MG TABS   RESOLVED: Moderate episode of recurrent major depressive disorder (HCC)   Relevant Medications   FLUoxetine HCl 60 MG TABS   busPIRone (BUSPAR) 15 MG tablet   hydrOXYzine (ATARAX) 25 MG tablet   RESOLVED: Generalized anxiety disorder   Relevant Medications   FLUoxetine HCl 60 MG TABS   busPIRone (BUSPAR) 15 MG tablet   hydrOXYzine (ATARAX) 25 MG tablet   Other Visit Diagnoses     Generalized anxiety disorder with panic attacks       Relevant Medications   FLUoxetine HCl 60 MG TABS   busPIRone (BUSPAR) 15 MG tablet   hydrOXYzine (ATARAX) 25 MG tablet       I have discontinued Joelene Millin  A. Eklund "Kim"'s Norel AD, traMADol, diclofenac, estradiol, nitrofurantoin (macrocrystal-monohydrate), and oxyCODONE-acetaminophen. I have also changed her busPIRone and hydrOXYzine. Additionally, I am having her start on HYDROcodone-acetaminophen and traMADol. Lastly, I am having her maintain her Hydrocortisone Acetate, gabapentin, cetirizine, nystatin, naloxone, ZTlido, cyclobenzaprine, oxybutynin, and FLUoxetine HCl.  Meds ordered this encounter  Medications   HYDROcodone-acetaminophen (NORCO) 5-325 MG tablet    Sig: Take 1 tablet by mouth every 6 (six) hours as needed for moderate pain.    Dispense:  30 tablet    Refill:  0    Order Specific Question:   Supervising Provider    Answer:   BEDSOLE, AMY E [[id number]]   traMADol (ULTRAM) 50 MG tablet    Sig: Take one po qd prn pain    Dispense:  15 tablet    Refill:  0    Order Specific Question:   Supervising Provider    Answer:   BEDSOLE, AMY E [[id number]]   FLUoxetine HCl 60 MG TABS    Sig: Take 60 mg by mouth daily. Needs  appt with PCP for further refills.    Dispense:  90 tablet    Refill:  0    Order Specific Question:   Supervising Provider    Answer:   BEDSOLE, AMY E [[id number]]   busPIRone (BUSPAR) 15 MG tablet    Sig: Take 1 tablet (15 mg total) by mouth 3 (three) times daily.    Dispense:  270 tablet    Refill:  0    Order Specific Question:   Supervising Provider    Answer:   BEDSOLE, AMY E [[id number]]   hydrOXYzine (ATARAX) 25 MG tablet    Sig: Take 1 tablet (25 mg total) by mouth every 8 (eight) hours as needed. Needs appt with PCP for further refills.    Dispense:  90 tablet    Refill:  0    Order Specific Question:   Supervising Provider    Answer:   BEDSOLE, AMY E [[id number]]    I discussed the assessment and treatment plan with the patient. The patient was provided an opportunity to ask questions and all were answered. The patient agreed with the plan and demonstrated an understanding of the instructions.   The patient was advised to call back or seek an in-person evaluation if the symptoms worsen or if the condition fails to improve as anticipated.  I provided 46 minutes of face-to-face time during this encounter.   Eugenia Pancoast, [place] [facility] [place] [phone] (phone) [phone] (fax)  [place]

## 2021-05-02 DIAGNOSIS — M549 Dorsalgia, unspecified: Secondary | ICD-10-CM | POA: Diagnosis not present

## 2021-05-02 DIAGNOSIS — M5416 Radiculopathy, lumbar region: Secondary | ICD-10-CM | POA: Diagnosis not present

## 2021-05-02 DIAGNOSIS — G894 Chronic pain syndrome: Secondary | ICD-10-CM | POA: Diagnosis not present

## 2021-05-02 DIAGNOSIS — M461 Sacroiliitis, not elsewhere classified: Secondary | ICD-10-CM | POA: Diagnosis not present

## 2021-05-02 DIAGNOSIS — Z79891 Long term (current) use of opiate analgesic: Secondary | ICD-10-CM | POA: Diagnosis not present

## 2021-05-05 ENCOUNTER — Ambulatory Visit: Payer: BC Managed Care – PPO | Admitting: Family

## 2021-05-07 ENCOUNTER — Other Ambulatory Visit: Payer: Self-pay | Admitting: Family

## 2021-05-20 ENCOUNTER — Ambulatory Visit: Payer: BC Managed Care – PPO | Admitting: Family

## 2021-06-06 ENCOUNTER — Telehealth: Payer: BC Managed Care – PPO | Admitting: Family Medicine

## 2021-06-06 ENCOUNTER — Ambulatory Visit: Payer: BC Managed Care – PPO | Admitting: Family Medicine

## 2021-06-06 DIAGNOSIS — G894 Chronic pain syndrome: Secondary | ICD-10-CM | POA: Diagnosis not present

## 2021-06-06 DIAGNOSIS — M549 Dorsalgia, unspecified: Secondary | ICD-10-CM | POA: Diagnosis not present

## 2021-06-06 DIAGNOSIS — M5416 Radiculopathy, lumbar region: Secondary | ICD-10-CM | POA: Diagnosis not present

## 2021-06-06 DIAGNOSIS — Z79891 Long term (current) use of opiate analgesic: Secondary | ICD-10-CM | POA: Diagnosis not present

## 2021-06-06 DIAGNOSIS — M461 Sacroiliitis, not elsewhere classified: Secondary | ICD-10-CM | POA: Diagnosis not present

## 2021-06-07 ENCOUNTER — Telehealth: Payer: BC Managed Care – PPO | Admitting: Family Medicine

## 2021-06-07 ENCOUNTER — Ambulatory Visit: Payer: BC Managed Care – PPO | Admitting: Family

## 2021-06-09 DIAGNOSIS — R300 Dysuria: Secondary | ICD-10-CM | POA: Diagnosis not present

## 2021-06-09 DIAGNOSIS — R197 Diarrhea, unspecified: Secondary | ICD-10-CM | POA: Diagnosis not present

## 2021-06-09 DIAGNOSIS — K8020 Calculus of gallbladder without cholecystitis without obstruction: Secondary | ICD-10-CM | POA: Diagnosis not present

## 2021-06-13 ENCOUNTER — Ambulatory Visit: Payer: BC Managed Care – PPO | Admitting: Family Medicine

## 2021-06-16 ENCOUNTER — Ambulatory Visit: Payer: BC Managed Care – PPO | Admitting: Family Medicine

## 2021-06-20 ENCOUNTER — Other Ambulatory Visit: Payer: Self-pay | Admitting: Family Medicine

## 2021-06-21 NOTE — Telephone Encounter (Signed)
Needs OV to discuss

## 2021-06-21 NOTE — Telephone Encounter (Signed)
Can not tell when this was filled last per chart, has it listed as historical. LOV 04/27/21 with Tabitha Dugal No future appointments. Multiple appointment cancellations in the chart.

## 2021-06-23 ENCOUNTER — Other Ambulatory Visit: Payer: Self-pay | Admitting: Family Medicine

## 2021-06-23 DIAGNOSIS — J301 Allergic rhinitis due to pollen: Secondary | ICD-10-CM

## 2021-06-24 MED ORDER — CETIRIZINE HCL 10 MG PO TABS: 10.0000 mg | ORAL_TABLET | Freq: Every day | ORAL | 0 refills | Status: DC

## 2021-06-29 DIAGNOSIS — M5416 Radiculopathy, lumbar region: Secondary | ICD-10-CM | POA: Diagnosis not present

## 2021-07-04 DIAGNOSIS — M461 Sacroiliitis, not elsewhere classified: Secondary | ICD-10-CM | POA: Diagnosis not present

## 2021-07-04 DIAGNOSIS — M5416 Radiculopathy, lumbar region: Secondary | ICD-10-CM | POA: Diagnosis not present

## 2021-07-04 DIAGNOSIS — G894 Chronic pain syndrome: Secondary | ICD-10-CM | POA: Diagnosis not present

## 2021-07-04 DIAGNOSIS — Z79891 Long term (current) use of opiate analgesic: Secondary | ICD-10-CM | POA: Diagnosis not present

## 2021-07-04 DIAGNOSIS — M549 Dorsalgia, unspecified: Secondary | ICD-10-CM | POA: Diagnosis not present

## 2021-07-12 ENCOUNTER — Encounter: Payer: Self-pay | Admitting: Family Medicine

## 2021-07-14 ENCOUNTER — Encounter: Payer: Self-pay | Admitting: Family Medicine

## 2021-07-14 ENCOUNTER — Telehealth (INDEPENDENT_AMBULATORY_CARE_PROVIDER_SITE_OTHER): Payer: BC Managed Care – PPO | Admitting: Family Medicine

## 2021-07-14 VITALS — Wt 183.0 lb

## 2021-07-14 DIAGNOSIS — J40 Bronchitis, not specified as acute or chronic: Secondary | ICD-10-CM | POA: Diagnosis not present

## 2021-07-14 DIAGNOSIS — F331 Major depressive disorder, recurrent, moderate: Secondary | ICD-10-CM | POA: Diagnosis not present

## 2021-07-14 DIAGNOSIS — J0100 Acute maxillary sinusitis, unspecified: Secondary | ICD-10-CM

## 2021-07-14 DIAGNOSIS — J301 Allergic rhinitis due to pollen: Secondary | ICD-10-CM

## 2021-07-14 DIAGNOSIS — F419 Anxiety disorder, unspecified: Secondary | ICD-10-CM

## 2021-07-14 DIAGNOSIS — R110 Nausea: Secondary | ICD-10-CM

## 2021-07-14 MED ORDER — ALBUTEROL SULFATE HFA 108 (90 BASE) MCG/ACT IN AERS: 2.0000 | INHALATION_SPRAY | Freq: Four times a day (QID) | RESPIRATORY_TRACT | 2 refills | Status: DC | PRN

## 2021-07-14 MED ORDER — DOXYCYCLINE HYCLATE 100 MG PO TABS: 100.0000 mg | ORAL_TABLET | Freq: Two times a day (BID) | ORAL | 0 refills | Status: AC

## 2021-07-14 NOTE — Progress Notes (Signed)
? ? ?I connected with Hermenia Bers on 07/14/21 at 12:00 PM EDT by video and verified that I am speaking with the correct person using two identifiers. ?  ?I discussed the limitations, risks, security and privacy concerns of performing an evaluation and management service by video and the availability of in person appointments. I also discussed with the patient that there may be a patient responsible charge related to this service. The patient expressed understanding and agreed to proceed. ? ?Patient location: Home ?Provider Location: [facility] ?Participants: [name] and Hermenia Bers ? ? ?Subjective:  ? ?  ?MAKHAYLA MCMURRY is a 51 y.o. female presenting for Cough (X 3 weeks. 2 covid tests were negative ), Nasal Congestion, and Headache ?  ? ? ?Cough ?This is a new problem. The current episode started 1 to 4 weeks ago. The cough is Non-productive. Associated symptoms include headaches, nasal congestion, rhinorrhea, a sore throat (resolved) and wheezing. Pertinent negatives include no chills, fever or shortness of breath. Treatments tried: mucinex, robutussin, zyrtec. The treatment provided no relief.  ?Headache  ?Associated symptoms include coughing, rhinorrhea, sinus pressure and a sore throat (resolved). Pertinent negatives include no fever.  ? ? ?Review of Systems  ?Constitutional:  Negative for chills and fever.  ?HENT:  Positive for rhinorrhea, sinus pressure, sinus pain and sore throat (resolved).   ?Respiratory:  Positive for cough and wheezing. Negative for shortness of breath.   ?Neurological:  Positive for headaches.  ? ? ?Social History  ? ?Tobacco Use  ?Smoking Status Former  ? Packs/day: 0.25  ? Years: 20.00  ? Pack years: 5.00  ? Types: Cigarettes  ? Quit date: 09/01/2019  ? Years since quitting: 1.8  ?Smokeless Tobacco Never  ?Tobacco Comments  ? not daily  ? ? ? ?   ?Objective:  ? ?BP Readings from Last 3 Encounters:  ?04/27/21 (!) 147/90  ?01/26/21 104/76  ?10/07/20 118/80   ? ?Wt Readings from Last 3 Encounters:  ?07/14/21 183 lb (83 kg)  ?04/27/21 258 lb (117 kg)  ?01/26/21 258 lb (117 kg)  ? ?Wt 183 lb (83 kg)   LMP 06/23/2015   BMI 27.02 kg/m?  ? ?Physical Exam ?Constitutional:   ?   Appearance: Normal appearance. She is not ill-appearing.  ?HENT:  ?   Head: Normocephalic and atraumatic.  ?   Right Ear: External ear normal.  ?   Left Ear: External ear normal.  ?Eyes:  ?   Conjunctiva/sclera: Conjunctivae normal.  ?Pulmonary:  ?   Effort: Pulmonary effort is normal. No respiratory distress.  ?Neurological:  ?   Mental Status: She is alert. Mental status is at baseline.  ?Psychiatric:     ?   Mood and Affect: Mood normal.     ?   Behavior: Behavior normal.     ?   Thought Content: Thought content normal.     ?   Judgment: Judgment normal.  ? ? ? ? ? ? ? ?   ?Assessment & Plan:  ? ?Problem List Items Addressed This Visit   ? ?  ? Respiratory  ? Seasonal allergic rhinitis due to pollen - Primary  ?  ? Other  ? Recurrent moderate major depressive disorder with anxiety ([place])  ?  She notes she is doing okay currently but does not seem in full remission.  Continue BuSpar 15 mg 3 times a day and fluoxetine 60 mg.  She is willing to try therapy referral placed today. ?  ?  ?  Relevant Orders  ? Ambulatory referral to Psychology  ? ?Other Visit Diagnoses   ? ? Acute non-recurrent maxillary sinusitis      ? Relevant Medications  ? doxycycline (VIBRA-TABS) 100 MG tablet  ? Bronchitis      ? Relevant Medications  ? albuterol (VENTOLIN HFA) 108 (90 Base) MCG/ACT inhaler  ? ?  ? ?Suspect sinus infection complicated by bronchitis which has previously needed albuterol in the past.  Refill of albuterol antibiotics prescribed.  Discussed if she has not improved giving smoking history if she still having wheezing would consider trial of steroids. ? ?Return if symptoms worsen or fail to improve. ? ?[name], MD ? ?

## 2021-07-14 NOTE — Assessment & Plan Note (Signed)
She notes she is doing okay currently but does not seem in full remission.  Continue BuSpar 15 mg 3 times a day and fluoxetine 60 mg.  She is willing to try therapy referral placed today. ?

## 2021-07-18 MED ORDER — PREDNISONE 20 MG PO TABS: 40.0000 mg | ORAL_TABLET | Freq: Every day | ORAL | 0 refills | Status: AC

## 2021-07-18 MED ORDER — ONDANSETRON HCL 4 MG PO TABS: 4.0000 mg | ORAL_TABLET | Freq: Three times a day (TID) | ORAL | 0 refills | Status: DC | PRN

## 2021-07-18 NOTE — Addendum Note (Signed)
Addended by: [name] on: 07/18/2021 01:59 PM ? ? Modules accepted: Orders ? ?

## 2021-07-24 ENCOUNTER — Other Ambulatory Visit: Payer: Self-pay | Admitting: Family

## 2021-07-24 DIAGNOSIS — F331 Major depressive disorder, recurrent, moderate: Secondary | ICD-10-CM

## 2021-07-24 DIAGNOSIS — F411 Generalized anxiety disorder: Secondary | ICD-10-CM

## 2021-07-30 DIAGNOSIS — M5416 Radiculopathy, lumbar region: Secondary | ICD-10-CM | POA: Diagnosis not present

## 2021-08-01 DIAGNOSIS — G894 Chronic pain syndrome: Secondary | ICD-10-CM | POA: Diagnosis not present

## 2021-08-01 DIAGNOSIS — Z79891 Long term (current) use of opiate analgesic: Secondary | ICD-10-CM | POA: Diagnosis not present

## 2021-08-01 DIAGNOSIS — M5416 Radiculopathy, lumbar region: Secondary | ICD-10-CM | POA: Diagnosis not present

## 2021-08-14 ENCOUNTER — Other Ambulatory Visit: Payer: Self-pay | Admitting: Family Medicine

## 2021-08-15 ENCOUNTER — Ambulatory Visit: Payer: BC Managed Care – PPO | Admitting: Family Medicine

## 2021-08-16 NOTE — Telephone Encounter (Signed)
Last filled 02-23-21 #30 ?[facility] [address] ?Multiple canceled appts ?LAst OV/VV 04-27-21 with Tabitha for a problem visit ?

## 2021-08-29 DIAGNOSIS — M549 Dorsalgia, unspecified: Secondary | ICD-10-CM | POA: Diagnosis not present

## 2021-08-29 DIAGNOSIS — M5416 Radiculopathy, lumbar region: Secondary | ICD-10-CM | POA: Diagnosis not present

## 2021-08-29 DIAGNOSIS — M461 Sacroiliitis, not elsewhere classified: Secondary | ICD-10-CM | POA: Diagnosis not present

## 2021-08-29 DIAGNOSIS — Z79891 Long term (current) use of opiate analgesic: Secondary | ICD-10-CM | POA: Diagnosis not present

## 2021-08-29 DIAGNOSIS — G894 Chronic pain syndrome: Secondary | ICD-10-CM | POA: Diagnosis not present

## 2021-09-08 ENCOUNTER — Other Ambulatory Visit: Payer: Self-pay | Admitting: Family Medicine

## 2021-09-13 DIAGNOSIS — K219 Gastro-esophageal reflux disease without esophagitis: Secondary | ICD-10-CM | POA: Diagnosis not present

## 2021-09-13 DIAGNOSIS — K580 Irritable bowel syndrome with diarrhea: Secondary | ICD-10-CM | POA: Diagnosis not present

## 2021-09-29 DIAGNOSIS — M5416 Radiculopathy, lumbar region: Secondary | ICD-10-CM | POA: Diagnosis not present

## 2021-09-30 ENCOUNTER — Encounter: Payer: BC Managed Care – PPO | Admitting: Family Medicine

## 2021-10-03 DIAGNOSIS — M549 Dorsalgia, unspecified: Secondary | ICD-10-CM | POA: Diagnosis not present

## 2021-10-03 DIAGNOSIS — Z79891 Long term (current) use of opiate analgesic: Secondary | ICD-10-CM | POA: Diagnosis not present

## 2021-10-03 DIAGNOSIS — M5416 Radiculopathy, lumbar region: Secondary | ICD-10-CM | POA: Diagnosis not present

## 2021-10-03 DIAGNOSIS — G894 Chronic pain syndrome: Secondary | ICD-10-CM | POA: Diagnosis not present

## 2021-10-05 ENCOUNTER — Other Ambulatory Visit: Payer: Self-pay | Admitting: General Surgery

## 2021-10-05 DIAGNOSIS — K8020 Calculus of gallbladder without cholecystitis without obstruction: Secondary | ICD-10-CM

## 2021-10-06 ENCOUNTER — Other Ambulatory Visit: Payer: Self-pay | Admitting: Family Medicine

## 2021-10-07 ENCOUNTER — Other Ambulatory Visit: Payer: Self-pay | Admitting: Family Medicine

## 2021-10-10 ENCOUNTER — Ambulatory Visit: Payer: BC Managed Care – PPO | Admitting: Family Medicine

## 2021-10-11 ENCOUNTER — Other Ambulatory Visit: Payer: Self-pay | Admitting: Family Medicine

## 2021-10-12 MED ORDER — CYCLOBENZAPRINE HCL 10 MG PO TABS: ORAL_TABLET | ORAL | 0 refills | Status: DC

## 2021-10-13 ENCOUNTER — Telehealth: Payer: BC Managed Care – PPO | Admitting: Family Medicine

## 2021-10-13 ENCOUNTER — Other Ambulatory Visit: Payer: Self-pay | Admitting: Family Medicine

## 2021-10-13 DIAGNOSIS — J301 Allergic rhinitis due to pollen: Secondary | ICD-10-CM

## 2021-10-14 ENCOUNTER — Encounter: Payer: Self-pay | Admitting: Family Medicine

## 2021-10-17 ENCOUNTER — Telehealth: Payer: BC Managed Care – PPO | Admitting: Family Medicine

## 2021-10-19 ENCOUNTER — Ambulatory Visit
Admission: RE | Admit: 2021-10-19 | Discharge: 2021-10-19 | Disposition: A | Discharge status: Home / Self Care | Payer: BC Managed Care – PPO | Source: Ambulatory Visit | Attending: General Surgery | Admitting: General Surgery

## 2021-10-19 DIAGNOSIS — K8020 Calculus of gallbladder without cholecystitis without obstruction: Secondary | ICD-10-CM | POA: Insufficient documentation

## 2021-10-19 DIAGNOSIS — R1011 Right upper quadrant pain: Secondary | ICD-10-CM | POA: Diagnosis not present

## 2021-10-19 MED ORDER — TECHNETIUM TC 99M MEBROFENIN IV KIT
5.2900 | PACK | Freq: Once | INTRAVENOUS | Status: AC | PRN
  Administered 2021-10-19: 5.29 via INTRAVENOUS

## 2021-10-25 DIAGNOSIS — R197 Diarrhea, unspecified: Secondary | ICD-10-CM | POA: Diagnosis not present

## 2021-10-25 DIAGNOSIS — R1010 Upper abdominal pain, unspecified: Secondary | ICD-10-CM | POA: Diagnosis not present

## 2021-10-29 DIAGNOSIS — M5416 Radiculopathy, lumbar region: Secondary | ICD-10-CM | POA: Diagnosis not present

## 2021-11-03 DIAGNOSIS — G894 Chronic pain syndrome: Secondary | ICD-10-CM | POA: Diagnosis not present

## 2021-11-03 DIAGNOSIS — M549 Dorsalgia, unspecified: Secondary | ICD-10-CM | POA: Diagnosis not present

## 2021-11-03 DIAGNOSIS — Z79891 Long term (current) use of opiate analgesic: Secondary | ICD-10-CM | POA: Diagnosis not present

## 2021-11-03 DIAGNOSIS — Z1389 Encounter for screening for other disorder: Secondary | ICD-10-CM | POA: Diagnosis not present

## 2021-11-06 ENCOUNTER — Encounter (HOSPITAL_COMMUNITY): Payer: Self-pay | Admitting: Radiology

## 2021-11-08 ENCOUNTER — Other Ambulatory Visit: Payer: Self-pay | Admitting: Family Medicine

## 2021-11-08 DIAGNOSIS — F411 Generalized anxiety disorder: Secondary | ICD-10-CM

## 2021-11-08 DIAGNOSIS — F331 Major depressive disorder, recurrent, moderate: Secondary | ICD-10-CM

## 2021-11-09 ENCOUNTER — Other Ambulatory Visit: Payer: Self-pay | Admitting: Family Medicine

## 2021-11-09 DIAGNOSIS — F410 Panic disorder [episodic paroxysmal anxiety] without agoraphobia: Secondary | ICD-10-CM

## 2021-11-16 DIAGNOSIS — M5416 Radiculopathy, lumbar region: Secondary | ICD-10-CM | POA: Diagnosis not present

## 2021-11-16 DIAGNOSIS — G894 Chronic pain syndrome: Secondary | ICD-10-CM | POA: Diagnosis not present

## 2021-11-16 DIAGNOSIS — Z79891 Long term (current) use of opiate analgesic: Secondary | ICD-10-CM | POA: Diagnosis not present

## 2021-11-16 NOTE — Telephone Encounter (Signed)
Patient has an appointment scheduled for 7/82

## 2021-11-25 ENCOUNTER — Ambulatory Visit: Payer: BC Managed Care – PPO | Admitting: Family Medicine

## 2021-11-25 VITALS — BP 128/90 | HR 74 | Temp 97.4°F | Wt 214.1 lb

## 2021-11-25 DIAGNOSIS — F331 Major depressive disorder, recurrent, moderate: Secondary | ICD-10-CM

## 2021-11-25 DIAGNOSIS — Z114 Encounter for screening for human immunodeficiency virus [HIV]: Secondary | ICD-10-CM

## 2021-11-25 DIAGNOSIS — I10 Essential (primary) hypertension: Secondary | ICD-10-CM

## 2021-11-25 DIAGNOSIS — R110 Nausea: Secondary | ICD-10-CM | POA: Diagnosis not present

## 2021-11-25 DIAGNOSIS — Z1159 Encounter for screening for other viral diseases: Secondary | ICD-10-CM

## 2021-11-25 DIAGNOSIS — A6000 Herpesviral infection of urogenital system, unspecified: Secondary | ICD-10-CM | POA: Diagnosis not present

## 2021-11-25 DIAGNOSIS — F411 Generalized anxiety disorder: Secondary | ICD-10-CM

## 2021-11-25 DIAGNOSIS — Z13220 Encounter for screening for lipoid disorders: Secondary | ICD-10-CM

## 2021-11-25 DIAGNOSIS — G8929 Other chronic pain: Secondary | ICD-10-CM

## 2021-11-25 LAB — COMPREHENSIVE METABOLIC PANEL
ALT: 32 U/L (ref 0–35)
AST: 33 U/L (ref 0–37)
Albumin: 4.8 g/dL (ref 3.5–5.2)
Alkaline Phosphatase: 43 U/L (ref 39–117)
BUN: 8 mg/dL (ref 6–23)
CO2: 23 mEq/L (ref 19–32)
Calcium: 9.9 mg/dL (ref 8.4–10.5)
Chloride: 102 mEq/L (ref 96–112)
Creatinine, Ser: 0.76 mg/dL (ref 0.40–1.20)
GFR: 92.06 mL/min (ref >60.00)
Glucose, Bld: 80 mg/dL (ref 70–99)
Potassium: 3.7 mEq/L (ref 3.5–5.1)
Sodium: 140 mEq/L (ref 135–145)
Total Bilirubin: 0.7 mg/dL (ref 0.2–1.2)
Total Protein: 8 g/dL (ref 6.0–8.3)

## 2021-11-25 LAB — LIPID PANEL
Cholesterol: 212 mg/dL — ABNORMAL HIGH (ref 0–200)
HDL: 40.5 mg/dL (ref >39.00)
LDL Cholesterol: 134 mg/dL — ABNORMAL HIGH (ref 0–99)
NonHDL: 171.67
Total CHOL/HDL Ratio: 5
Triglycerides: 188 mg/dL — ABNORMAL HIGH (ref 0.0–149.0)
VLDL: 37.6 mg/dL (ref 0.0–40.0)

## 2021-11-25 LAB — CBC
HCT: 47.8 % — ABNORMAL HIGH (ref 36.0–46.0)
Hemoglobin: 16 g/dL — ABNORMAL HIGH (ref 12.0–15.0)
MCHC: 33.5 g/dL (ref 30.0–36.0)
MCV: 100.4 fl — ABNORMAL HIGH (ref 78.0–100.0)
Platelets: 209 10*3/uL (ref 150.0–400.0)
RBC: 4.76 Mil/uL (ref 3.87–5.11)
RDW: 14.4 % (ref 11.5–15.5)
WBC: 4.2 10*3/uL (ref 4.0–10.5)

## 2021-11-25 LAB — TSH: TSH: 1.64 u[IU]/mL (ref 0.35–5.50)

## 2021-11-25 MED ORDER — ONDANSETRON HCL 4 MG PO TABS: 4.0000 mg | ORAL_TABLET | Freq: Three times a day (TID) | ORAL | 0 refills | Status: DC | PRN

## 2021-11-25 MED ORDER — HYDROXYZINE HCL 25 MG PO TABS: 25.0000 mg | ORAL_TABLET | Freq: Every evening | ORAL | 1 refills | Status: DC

## 2021-11-25 MED ORDER — CYCLOBENZAPRINE HCL 10 MG PO TABS: ORAL_TABLET | ORAL | 0 refills | Status: DC

## 2021-11-25 MED ORDER — BUSPIRONE HCL 15 MG PO TABS: 15.0000 mg | ORAL_TABLET | Freq: Three times a day (TID) | ORAL | 1 refills | Status: DC

## 2021-11-25 MED ORDER — VALACYCLOVIR HCL 500 MG PO TABS: 1000.0000 mg | ORAL_TABLET | Freq: Every day | ORAL | 1 refills | Status: DC

## 2021-11-25 NOTE — Assessment & Plan Note (Signed)
Improved from prior, however still slightly elevated.  Courage patient to start monitoring blood pressure.  Labs today to assess thyroid and kidney function.  May need medication in the future.

## 2021-11-25 NOTE — Assessment & Plan Note (Signed)
Stable, continue fluoxetine 60 mg and BuSpar 15 mg 3 times daily.  Encouraged her to consider therapy, she is doing group therapy for chronic pain.

## 2021-11-25 NOTE — Progress Notes (Signed)
Subjective:     Erin Dorsey is a 51 y.o. female presenting for Spasms (All over body )     HPI  #Anxiety - has been worse in the last few days - has been dealing with more pain - including GB pain - is taking fluoxetine 60 mg, buspar 15 mg tid - overall feels these are helping - does not want to change medication - doing group therapy at the pain doctor - also taking hydroxyzine 25 mg nightly  Follows with Dr. Leeroy Cha in [place] - gabapentin - norco 10 mg TID - lidocaine patches - also taking flexeril 1 nightly - helps with sleep and spasms as night - not working with physical therapy   Zofran as needed for severe nausea  Genital herpes - has not been taking valtrex   Review of Systems   Social History   Tobacco Use  Smoking Status Former   Packs/day: 0.25   Years: 20.00   Total pack years: 5.00   Types: Cigarettes   Quit date: 09/01/2019   Years since quitting: 2.2  Smokeless Tobacco Never  Tobacco Comments   not daily        Objective:    BP Readings from Last 3 Encounters:  11/25/21 128/90  04/27/21 (!) 147/90  01/26/21 104/76   Wt Readings from Last 3 Encounters:  11/25/21 214 lb 2 oz (97.1 kg)  07/14/21 183 lb (83 kg)  04/27/21 258 lb (117 kg)    BP 128/90   Pulse 74   Temp (!) 97.4 F (36.3 C) (Temporal)   Wt 214 lb 2 oz (97.1 kg)   LMP 06/23/2015   SpO2 99%   BMI 31.62 kg/m    Physical Exam Constitutional:      General: She is not in acute distress.    Appearance: She is well-developed. She is not diaphoretic.  HENT:     Right Ear: External ear normal.     Left Ear: External ear normal.     Nose: Nose normal.  Eyes:     Conjunctiva/sclera: Conjunctivae normal.  Cardiovascular:     Rate and Rhythm: Normal rate and regular rhythm.     Heart sounds: No murmur heard. Pulmonary:     Effort: Pulmonary effort is normal. No respiratory distress.     Breath sounds: Normal breath sounds. No wheezing.  Musculoskeletal:      Cervical back: Neck supple.  Skin:    General: Skin is warm and dry.     Capillary Refill: Capillary refill takes less than 2 seconds.  Neurological:     Mental Status: She is alert. Mental status is at baseline.  Psychiatric:        Mood and Affect: Mood normal.        Behavior: Behavior normal.           Assessment & Plan:   Problem List Items Addressed This Visit       Cardiovascular and Mediastinum   Essential hypertension    Improved from prior, however still slightly elevated.  Courage patient to start monitoring blood pressure.  Labs today to assess thyroid and kidney function.  May need medication in the future.      Relevant Orders   Comprehensive metabolic panel   CBC   TSH     Genitourinary   Genital herpes simplex    Patient notes approximately an outbreak once a month.  We will start Valtrex 1000 mg daily, advised continuing for at least  6 months before consideration for dose reduction to 500 daily      Relevant Medications   valACYclovir (VALTREX) 500 MG tablet     Other   Moderate episode of recurrent major depressive disorder (HCC)    Stable, continue fluoxetine 60 mg and BuSpar 15 mg 3 times daily.  Encouraged her to consider therapy, she is doing group therapy for chronic pain.      Relevant Medications   hydrOXYzine (ATARAX) 25 MG tablet   busPIRone (BUSPAR) 15 MG tablet   Generalized anxiety disorder - Primary    Slight worsening recently due to some life stressors, but overall she notes symptoms are improved.  Continue fluoxetine 60 mg and BuSpar 15 mg 3 times daily.      Relevant Medications   hydrOXYzine (ATARAX) 25 MG tablet   busPIRone (BUSPAR) 15 MG tablet   Other chronic pain    She follows with a pain specialist who prescribes her hydrocodone and lidocaine patches.  He is also on gabapentin, but continues to have muscle spasms at night.  She takes Flexeril nightly to help with sleep and muscle spasms.  Discussed that she may want  to consider moving this medication over to her pain medicine doctor.  Refill provided.      Relevant Medications   HYDROcodone-acetaminophen (NORCO) 10-325 MG tablet   cyclobenzaprine (FLEXERIL) 10 MG tablet   Nausea    She follows with GI for some gallbladder flares, response to as needed Zofran.  Refill provided.      Relevant Medications   ondansetron (ZOFRAN) 4 MG tablet   Other Visit Diagnoses     Need for hepatitis C screening test       Relevant Orders   Hepatitis C antibody   Encounter for screening for HIV       Relevant Medications   valACYclovir (VALTREX) 500 MG tablet   Other Relevant Orders   HIV Antibody (routine testing w rflx)   Screening for hyperlipidemia       Relevant Orders   Lipid panel        Return in about 4 months (around 03/28/2022) for TOC - anxiety with Tabitha.  [name], MD

## 2021-11-25 NOTE — Assessment & Plan Note (Signed)
She follows with a pain specialist who prescribes her hydrocodone and lidocaine patches.  He is also on gabapentin, but continues to have muscle spasms at night.  She takes Flexeril nightly to help with sleep and muscle spasms.  Discussed that she may want to consider moving this medication over to her pain medicine doctor.  Refill provided.

## 2021-11-25 NOTE — Assessment & Plan Note (Signed)
She follows with GI for some gallbladder flares, response to as needed Zofran.  Refill provided.

## 2021-11-25 NOTE — Assessment & Plan Note (Signed)
Patient notes approximately an outbreak once a month.  We will start Valtrex 1000 mg daily, advised continuing for at least 6 months before consideration for dose reduction to 500 daily

## 2021-11-25 NOTE — Patient Instructions (Signed)
Refill flexeril today - maybe check with pain doctor to take over prescription   Herpes - take 2 pills every day - if no outbreaks after 6 months - can consider decreasing to 1 pill daily   Anxiety - continue medication - update if no improvement

## 2021-11-25 NOTE — Assessment & Plan Note (Signed)
Slight worsening recently due to some life stressors, but overall she notes symptoms are improved.  Continue fluoxetine 60 mg and BuSpar 15 mg 3 times daily.

## 2021-11-27 ENCOUNTER — Encounter: Payer: Self-pay | Admitting: Family Medicine

## 2021-11-27 ENCOUNTER — Other Ambulatory Visit: Payer: Self-pay | Admitting: Family Medicine

## 2021-11-27 DIAGNOSIS — J301 Allergic rhinitis due to pollen: Secondary | ICD-10-CM

## 2021-11-27 DIAGNOSIS — R200 Anesthesia of skin: Secondary | ICD-10-CM

## 2021-11-28 DIAGNOSIS — Z79891 Long term (current) use of opiate analgesic: Secondary | ICD-10-CM | POA: Diagnosis not present

## 2021-11-28 DIAGNOSIS — M5416 Radiculopathy, lumbar region: Secondary | ICD-10-CM | POA: Diagnosis not present

## 2021-11-28 DIAGNOSIS — G894 Chronic pain syndrome: Secondary | ICD-10-CM | POA: Diagnosis not present

## 2021-11-28 DIAGNOSIS — M549 Dorsalgia, unspecified: Secondary | ICD-10-CM | POA: Diagnosis not present

## 2021-11-28 MED ORDER — CETIRIZINE HCL 10 MG PO TABS: 10.0000 mg | ORAL_TABLET | Freq: Every day | ORAL | 1 refills | Status: DC

## 2021-11-28 MED ORDER — HYDROCORTISONE ACETATE 1 % EX CREA: 1.0000 "application " | TOPICAL_CREAM | Freq: Every day | CUTANEOUS | 0 refills | Status: DC | PRN

## 2021-11-29 DIAGNOSIS — M5416 Radiculopathy, lumbar region: Secondary | ICD-10-CM | POA: Diagnosis not present

## 2021-11-29 LAB — HEPATITIS C ANTIBODY: Hepatitis C Ab: NONREACTIVE

## 2021-11-29 LAB — HIV ANTIBODY (ROUTINE TESTING W REFLEX): HIV 1&2 Ab, 4th Generation: NONREACTIVE

## 2021-11-30 MED ORDER — NYSTATIN 100000 UNIT/GM EX POWD: CUTANEOUS | 5 refills | Status: DC

## 2021-12-02 MED ORDER — AMITRIPTYLINE HCL 25 MG PO TABS: 25.0000 mg | ORAL_TABLET | Freq: Every day | ORAL | 0 refills | Status: DC

## 2021-12-02 NOTE — Addendum Note (Signed)
Addended by: Waunita Schooner R on: 12/02/2021 10:29 AM   Modules accepted: Orders

## 2021-12-13 IMAGING — DX DG HIP (WITH OR WITHOUT PELVIS) 2-3V*R*
3 series · 3 of 3 positions shown · non-contrast
Comparison: None.

CLINICAL DATA: Right hip pain.

EXAM:
DG HIP (WITH OR WITHOUT PELVIS) 2-3V RIGHT

[pelvis ap]
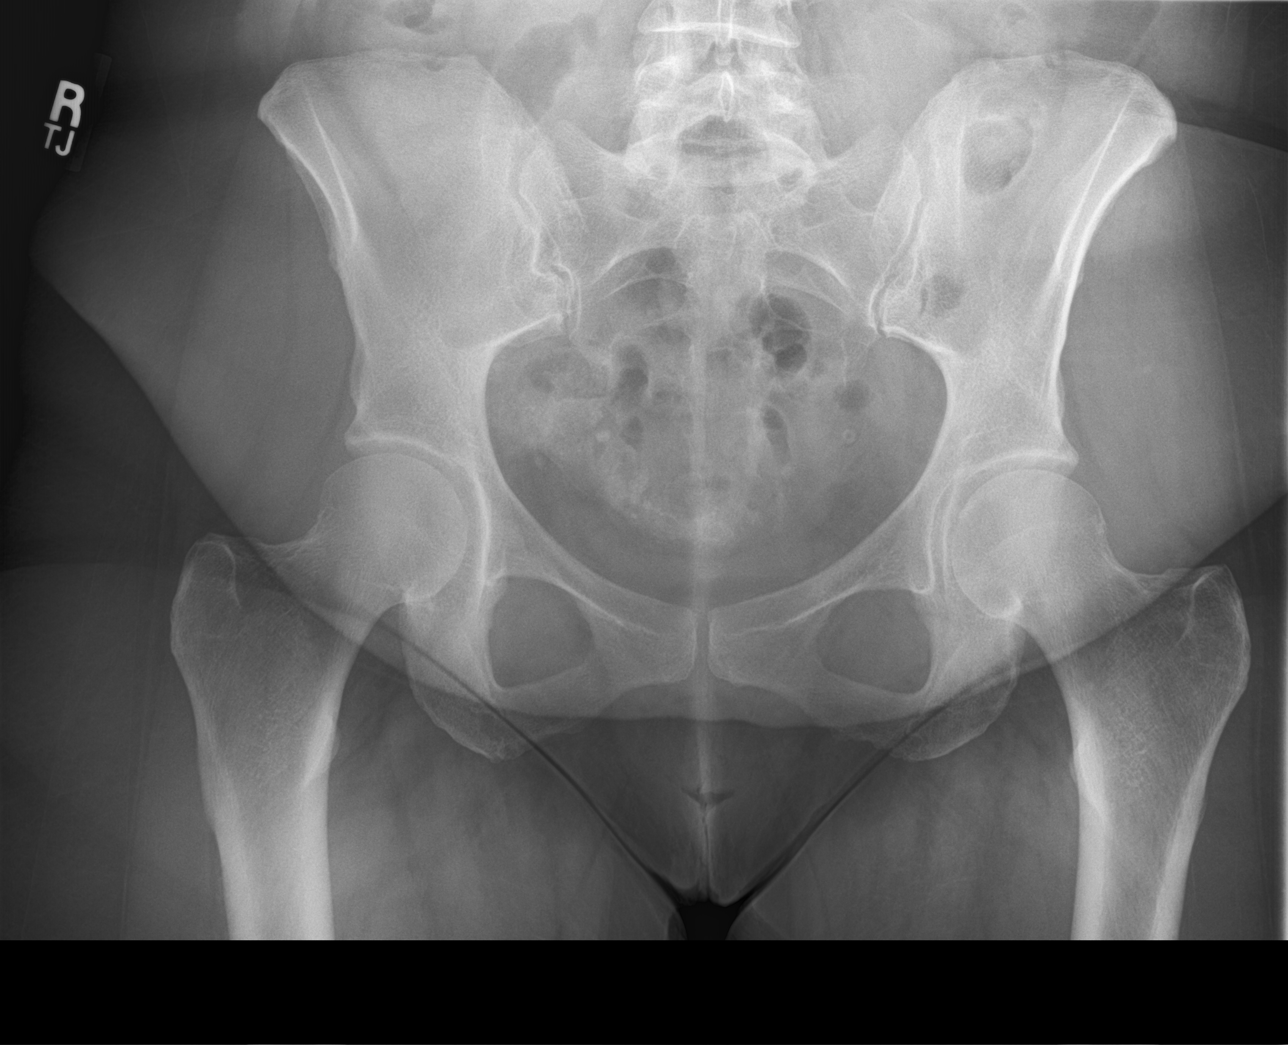

[hip ap]
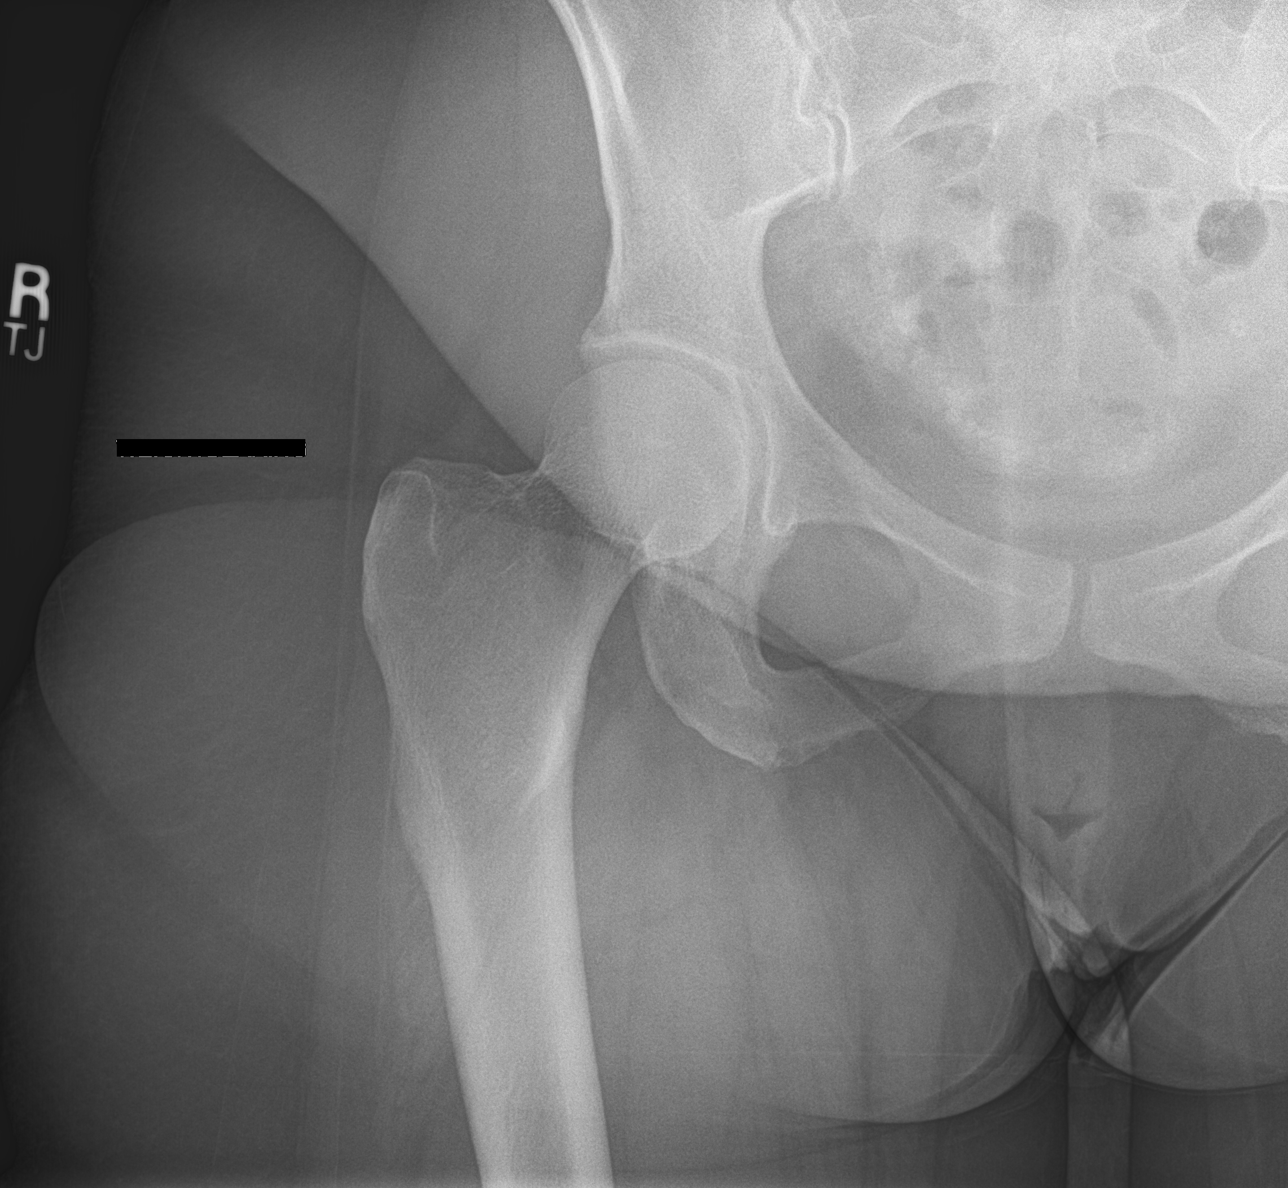

[hip lat]
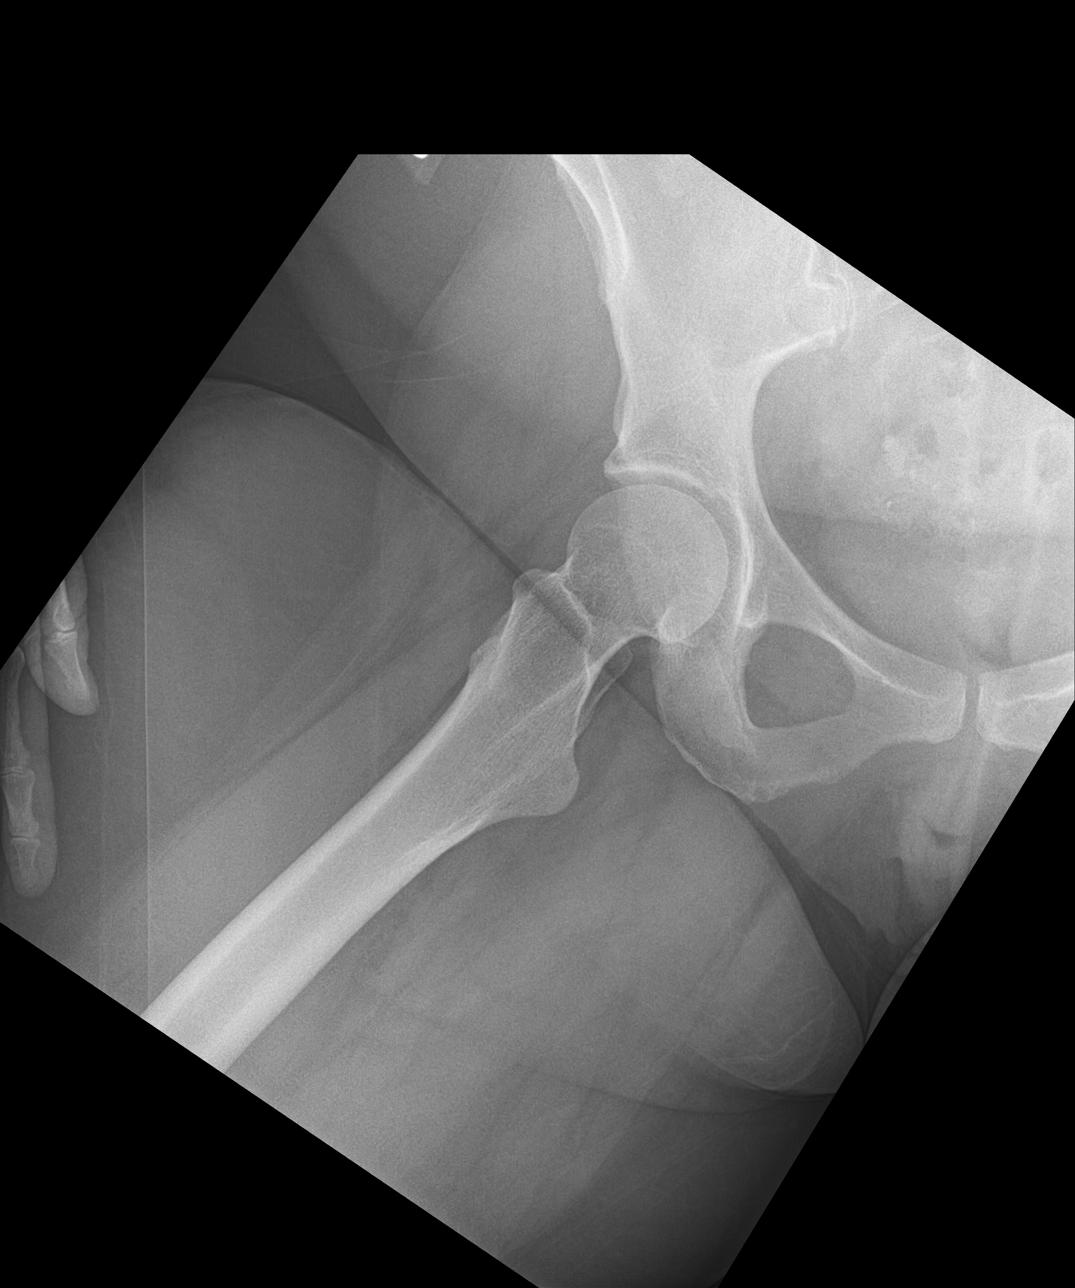

[3 of 3 positions shown; findings below may reference images not displayed]

FINDINGS: There is no evidence of hip fracture or dislocation. The bony pelvis
is intact without evidence of fracture, lesion or diastasis. There
is no evidence of arthropathy or other focal bone abnormality. No
soft tissue abnormalities identified.
IMPRESSION: Negative.

## 2021-12-19 ENCOUNTER — Encounter: Payer: Self-pay | Admitting: Family Medicine

## 2021-12-20 ENCOUNTER — Other Ambulatory Visit: Payer: Self-pay | Admitting: Family Medicine

## 2021-12-20 DIAGNOSIS — R110 Nausea: Secondary | ICD-10-CM

## 2021-12-26 DIAGNOSIS — G894 Chronic pain syndrome: Secondary | ICD-10-CM | POA: Diagnosis not present

## 2021-12-26 DIAGNOSIS — Z79891 Long term (current) use of opiate analgesic: Secondary | ICD-10-CM | POA: Diagnosis not present

## 2021-12-26 DIAGNOSIS — M549 Dorsalgia, unspecified: Secondary | ICD-10-CM | POA: Diagnosis not present

## 2021-12-30 DIAGNOSIS — M5416 Radiculopathy, lumbar region: Secondary | ICD-10-CM | POA: Diagnosis not present

## 2021-12-30 DIAGNOSIS — E109 Type 1 diabetes mellitus without complications: Secondary | ICD-10-CM | POA: Diagnosis not present

## 2022-01-10 ENCOUNTER — Other Ambulatory Visit: Payer: Self-pay | Admitting: Family Medicine

## 2022-01-11 MED ORDER — HYDROCORTISONE ACETATE 1 % EX CREA: 1.0000 "application " | TOPICAL_CREAM | Freq: Every day | CUTANEOUS | 0 refills | Status: AC | PRN

## 2022-01-23 DIAGNOSIS — Z79891 Long term (current) use of opiate analgesic: Secondary | ICD-10-CM | POA: Diagnosis not present

## 2022-01-23 DIAGNOSIS — M549 Dorsalgia, unspecified: Secondary | ICD-10-CM | POA: Diagnosis not present

## 2022-01-23 DIAGNOSIS — G894 Chronic pain syndrome: Secondary | ICD-10-CM | POA: Diagnosis not present

## 2022-01-29 DIAGNOSIS — M5416 Radiculopathy, lumbar region: Secondary | ICD-10-CM | POA: Diagnosis not present

## 2022-01-29 DIAGNOSIS — E109 Type 1 diabetes mellitus without complications: Secondary | ICD-10-CM | POA: Diagnosis not present

## 2022-02-06 ENCOUNTER — Ambulatory Visit: Payer: BC Managed Care – PPO | Admitting: Family Medicine

## 2022-02-06 ENCOUNTER — Ambulatory Visit: Payer: BC Managed Care – PPO | Admitting: Family

## 2022-02-09 DIAGNOSIS — M549 Dorsalgia, unspecified: Secondary | ICD-10-CM | POA: Diagnosis not present

## 2022-02-09 DIAGNOSIS — Z79891 Long term (current) use of opiate analgesic: Secondary | ICD-10-CM | POA: Diagnosis not present

## 2022-02-09 DIAGNOSIS — G894 Chronic pain syndrome: Secondary | ICD-10-CM | POA: Diagnosis not present

## 2022-02-20 ENCOUNTER — Other Ambulatory Visit: Payer: Self-pay

## 2022-02-20 DIAGNOSIS — R200 Anesthesia of skin: Secondary | ICD-10-CM

## 2022-02-20 MED ORDER — AMITRIPTYLINE HCL 25 MG PO TABS: 25.0000 mg | ORAL_TABLET | Freq: Every day | ORAL | 0 refills | Status: DC

## 2022-02-25 ENCOUNTER — Encounter: Payer: Self-pay | Admitting: Family

## 2022-02-27 ENCOUNTER — Telehealth: Payer: Self-pay

## 2022-02-27 ENCOUNTER — Other Ambulatory Visit: Payer: Self-pay

## 2022-02-27 DIAGNOSIS — G8929 Other chronic pain: Secondary | ICD-10-CM

## 2022-02-27 MED ORDER — CYCLOBENZAPRINE HCL 10 MG PO TABS: ORAL_TABLET | ORAL | 0 refills | Status: DC

## 2022-02-27 NOTE — Telephone Encounter (Signed)
Last refill: 11/25/21 #90 Last OV: 11/25/21 TOC scheduled for 03/31/22

## 2022-02-28 ENCOUNTER — Ambulatory Visit: Payer: BC Managed Care – PPO | Admitting: Family

## 2022-03-01 ENCOUNTER — Other Ambulatory Visit: Payer: Self-pay

## 2022-03-01 DIAGNOSIS — E109 Type 1 diabetes mellitus without complications: Secondary | ICD-10-CM | POA: Diagnosis not present

## 2022-03-01 DIAGNOSIS — R110 Nausea: Secondary | ICD-10-CM

## 2022-03-01 DIAGNOSIS — M5416 Radiculopathy, lumbar region: Secondary | ICD-10-CM | POA: Diagnosis not present

## 2022-03-01 MED ORDER — ONDANSETRON HCL 4 MG PO TABS: ORAL_TABLET | ORAL | 0 refills | Status: DC

## 2022-03-05 ENCOUNTER — Encounter: Payer: Self-pay | Admitting: Family

## 2022-03-06 ENCOUNTER — Other Ambulatory Visit: Payer: Self-pay | Admitting: Family

## 2022-03-31 ENCOUNTER — Ambulatory Visit: Payer: BC Managed Care – PPO | Admitting: Family

## 2022-03-31 DIAGNOSIS — M79606 Pain in leg, unspecified: Secondary | ICD-10-CM | POA: Diagnosis not present

## 2022-03-31 DIAGNOSIS — M25552 Pain in left hip: Secondary | ICD-10-CM | POA: Diagnosis not present

## 2022-03-31 DIAGNOSIS — M25551 Pain in right hip: Secondary | ICD-10-CM | POA: Diagnosis not present

## 2022-03-31 DIAGNOSIS — Z79899 Other long term (current) drug therapy: Secondary | ICD-10-CM | POA: Diagnosis not present

## 2022-03-31 DIAGNOSIS — M545 Low back pain, unspecified: Secondary | ICD-10-CM | POA: Diagnosis not present

## 2022-03-31 DIAGNOSIS — Z23 Encounter for immunization: Secondary | ICD-10-CM | POA: Diagnosis not present

## 2022-03-31 DIAGNOSIS — E109 Type 1 diabetes mellitus without complications: Secondary | ICD-10-CM | POA: Diagnosis not present

## 2022-03-31 DIAGNOSIS — Z6832 Body mass index (BMI) 32.0-32.9, adult: Secondary | ICD-10-CM | POA: Diagnosis not present

## 2022-03-31 DIAGNOSIS — M129 Arthropathy, unspecified: Secondary | ICD-10-CM | POA: Diagnosis not present

## 2022-03-31 DIAGNOSIS — M5416 Radiculopathy, lumbar region: Secondary | ICD-10-CM | POA: Diagnosis not present

## 2022-04-04 ENCOUNTER — Other Ambulatory Visit: Payer: Self-pay

## 2022-04-04 DIAGNOSIS — A6000 Herpesviral infection of urogenital system, unspecified: Secondary | ICD-10-CM

## 2022-04-04 MED ORDER — VALACYCLOVIR HCL 500 MG PO TABS: 1000.0000 mg | ORAL_TABLET | Freq: Every day | ORAL | 1 refills | Status: DC

## 2022-04-08 ENCOUNTER — Other Ambulatory Visit: Payer: Self-pay | Admitting: Obstetrics and Gynecology

## 2022-04-08 DIAGNOSIS — N3941 Urge incontinence: Secondary | ICD-10-CM

## 2022-04-14 DIAGNOSIS — M25551 Pain in right hip: Secondary | ICD-10-CM | POA: Diagnosis not present

## 2022-04-14 DIAGNOSIS — M129 Arthropathy, unspecified: Secondary | ICD-10-CM | POA: Diagnosis not present

## 2022-04-14 DIAGNOSIS — M79606 Pain in leg, unspecified: Secondary | ICD-10-CM | POA: Diagnosis not present

## 2022-04-14 DIAGNOSIS — Z79899 Other long term (current) drug therapy: Secondary | ICD-10-CM | POA: Diagnosis not present

## 2022-04-14 DIAGNOSIS — M545 Low back pain, unspecified: Secondary | ICD-10-CM | POA: Diagnosis not present

## 2022-04-14 DIAGNOSIS — Z6832 Body mass index (BMI) 32.0-32.9, adult: Secondary | ICD-10-CM | POA: Diagnosis not present

## 2022-04-20 DIAGNOSIS — Z79899 Other long term (current) drug therapy: Secondary | ICD-10-CM | POA: Diagnosis not present

## 2022-04-26 DIAGNOSIS — M5416 Radiculopathy, lumbar region: Secondary | ICD-10-CM | POA: Diagnosis not present

## 2022-04-27 ENCOUNTER — Ambulatory Visit: Payer: BC Managed Care – PPO | Admitting: Family

## 2022-04-30 DIAGNOSIS — M5416 Radiculopathy, lumbar region: Secondary | ICD-10-CM | POA: Diagnosis not present

## 2022-05-01 DIAGNOSIS — E109 Type 1 diabetes mellitus without complications: Secondary | ICD-10-CM | POA: Diagnosis not present

## 2022-05-03 ENCOUNTER — Other Ambulatory Visit: Payer: Self-pay | Admitting: Family

## 2022-05-03 DIAGNOSIS — R200 Anesthesia of skin: Secondary | ICD-10-CM

## 2022-05-08 ENCOUNTER — Ambulatory Visit: Payer: BC Managed Care – PPO | Admitting: Family

## 2022-05-14 ENCOUNTER — Other Ambulatory Visit: Payer: Self-pay | Admitting: Family

## 2022-05-14 DIAGNOSIS — G8929 Other chronic pain: Secondary | ICD-10-CM

## 2022-05-15 DIAGNOSIS — Z6830 Body mass index (BMI) 30.0-30.9, adult: Secondary | ICD-10-CM | POA: Diagnosis not present

## 2022-05-15 DIAGNOSIS — M25551 Pain in right hip: Secondary | ICD-10-CM | POA: Diagnosis not present

## 2022-05-15 DIAGNOSIS — M545 Low back pain, unspecified: Secondary | ICD-10-CM | POA: Diagnosis not present

## 2022-05-15 DIAGNOSIS — M79606 Pain in leg, unspecified: Secondary | ICD-10-CM | POA: Diagnosis not present

## 2022-05-15 DIAGNOSIS — M129 Arthropathy, unspecified: Secondary | ICD-10-CM | POA: Diagnosis not present

## 2022-05-15 DIAGNOSIS — Z79899 Other long term (current) drug therapy: Secondary | ICD-10-CM | POA: Diagnosis not present

## 2022-05-17 DIAGNOSIS — Z79899 Other long term (current) drug therapy: Secondary | ICD-10-CM | POA: Diagnosis not present

## 2022-05-19 DIAGNOSIS — K625 Hemorrhage of anus and rectum: Secondary | ICD-10-CM | POA: Diagnosis not present

## 2022-05-19 DIAGNOSIS — R030 Elevated blood-pressure reading, without diagnosis of hypertension: Secondary | ICD-10-CM | POA: Diagnosis not present

## 2022-05-19 DIAGNOSIS — R32 Unspecified urinary incontinence: Secondary | ICD-10-CM | POA: Diagnosis not present

## 2022-05-19 DIAGNOSIS — Z6832 Body mass index (BMI) 32.0-32.9, adult: Secondary | ICD-10-CM | POA: Diagnosis not present

## 2022-05-19 DIAGNOSIS — R3989 Other symptoms and signs involving the genitourinary system: Secondary | ICD-10-CM | POA: Diagnosis not present

## 2022-05-19 DIAGNOSIS — R159 Full incontinence of feces: Secondary | ICD-10-CM | POA: Diagnosis not present

## 2022-06-01 DIAGNOSIS — E109 Type 1 diabetes mellitus without complications: Secondary | ICD-10-CM | POA: Diagnosis not present

## 2022-06-01 DIAGNOSIS — M5416 Radiculopathy, lumbar region: Secondary | ICD-10-CM | POA: Diagnosis not present

## 2022-06-15 DIAGNOSIS — Z79899 Other long term (current) drug therapy: Secondary | ICD-10-CM | POA: Diagnosis not present

## 2022-06-15 DIAGNOSIS — M545 Low back pain, unspecified: Secondary | ICD-10-CM | POA: Diagnosis not present

## 2022-06-15 DIAGNOSIS — M25551 Pain in right hip: Secondary | ICD-10-CM | POA: Diagnosis not present

## 2022-06-15 DIAGNOSIS — M25552 Pain in left hip: Secondary | ICD-10-CM | POA: Diagnosis not present

## 2022-06-15 DIAGNOSIS — Z6832 Body mass index (BMI) 32.0-32.9, adult: Secondary | ICD-10-CM | POA: Diagnosis not present

## 2022-06-15 DIAGNOSIS — M79606 Pain in leg, unspecified: Secondary | ICD-10-CM | POA: Diagnosis not present

## 2022-06-19 DIAGNOSIS — Z79899 Other long term (current) drug therapy: Secondary | ICD-10-CM | POA: Diagnosis not present

## 2022-06-20 ENCOUNTER — Other Ambulatory Visit: Payer: Self-pay | Admitting: Obstetrics and Gynecology

## 2022-06-20 DIAGNOSIS — N3941 Urge incontinence: Secondary | ICD-10-CM

## 2022-06-20 NOTE — Telephone Encounter (Signed)
1 additional refill until patient can be seen for annual exam. Patient made aware to schedule visit via mychart

## 2022-06-21 ENCOUNTER — Other Ambulatory Visit: Payer: Self-pay

## 2022-06-21 DIAGNOSIS — F331 Major depressive disorder, recurrent, moderate: Secondary | ICD-10-CM

## 2022-06-21 DIAGNOSIS — F411 Generalized anxiety disorder: Secondary | ICD-10-CM

## 2022-06-21 MED ORDER — BUSPIRONE HCL 15 MG PO TABS: 15.0000 mg | ORAL_TABLET | Freq: Three times a day (TID) | ORAL | 0 refills | Status: DC

## 2022-06-22 ENCOUNTER — Encounter: Payer: BC Managed Care – PPO | Admitting: Family

## 2022-06-30 DIAGNOSIS — M5416 Radiculopathy, lumbar region: Secondary | ICD-10-CM | POA: Diagnosis not present

## 2022-06-30 DIAGNOSIS — E109 Type 1 diabetes mellitus without complications: Secondary | ICD-10-CM | POA: Diagnosis not present

## 2022-07-06 ENCOUNTER — Telehealth: Payer: Self-pay

## 2022-07-06 NOTE — Telephone Encounter (Signed)
Prescription Request  07/06/2022  LOV: 11/25/21 acute anxiety  What is the name of the medication or equipment? Prednisone 20 mg Last refilled # 6 on 07/18/21   Have you contacted your pharmacy to request a refill? Yes   Which pharmacy would you like this sent to?  [id number] [place] [id number]: [phone]: [phone]   Patient notified that their request is being sent to the clinical staff for review and that they should receive a response within 2 business days.   Please advise at  [facility]    Pt has TOC already scheduled with TRed ChristiansFNP on 08/29/22.  I was unable to contact pt by any contact # to see why needing prednisone.  Sending note to PDutch QuintFNP.

## 2022-07-11 DIAGNOSIS — M5416 Radiculopathy, lumbar region: Secondary | ICD-10-CM | POA: Diagnosis not present

## 2022-07-13 DIAGNOSIS — Z79899 Other long term (current) drug therapy: Secondary | ICD-10-CM | POA: Diagnosis not present

## 2022-07-13 DIAGNOSIS — M25551 Pain in right hip: Secondary | ICD-10-CM | POA: Diagnosis not present

## 2022-07-13 DIAGNOSIS — E6609 Other obesity due to excess calories: Secondary | ICD-10-CM | POA: Diagnosis not present

## 2022-07-13 DIAGNOSIS — M545 Low back pain, unspecified: Secondary | ICD-10-CM | POA: Diagnosis not present

## 2022-07-13 DIAGNOSIS — M25552 Pain in left hip: Secondary | ICD-10-CM | POA: Diagnosis not present

## 2022-07-13 DIAGNOSIS — M79671 Pain in right foot: Secondary | ICD-10-CM | POA: Diagnosis not present

## 2022-07-13 DIAGNOSIS — Z6831 Body mass index (BMI) 31.0-31.9, adult: Secondary | ICD-10-CM | POA: Diagnosis not present

## 2022-07-17 DIAGNOSIS — Z79899 Other long term (current) drug therapy: Secondary | ICD-10-CM | POA: Diagnosis not present

## 2022-07-21 ENCOUNTER — Ambulatory Visit: Payer: BC Managed Care – PPO | Admitting: Family

## 2022-07-25 DIAGNOSIS — M5136 Other intervertebral disc degeneration, lumbar region: Secondary | ICD-10-CM | POA: Diagnosis not present

## 2022-07-25 DIAGNOSIS — M5416 Radiculopathy, lumbar region: Secondary | ICD-10-CM | POA: Diagnosis not present

## 2022-07-25 DIAGNOSIS — M6281 Muscle weakness (generalized): Secondary | ICD-10-CM | POA: Diagnosis not present

## 2022-07-29 DIAGNOSIS — M25552 Pain in left hip: Secondary | ICD-10-CM | POA: Diagnosis not present

## 2022-07-29 DIAGNOSIS — E6609 Other obesity due to excess calories: Secondary | ICD-10-CM | POA: Diagnosis not present

## 2022-07-29 DIAGNOSIS — M25551 Pain in right hip: Secondary | ICD-10-CM | POA: Diagnosis not present

## 2022-07-29 DIAGNOSIS — M79606 Pain in leg, unspecified: Secondary | ICD-10-CM | POA: Diagnosis not present

## 2022-07-29 DIAGNOSIS — Z6830 Body mass index (BMI) 30.0-30.9, adult: Secondary | ICD-10-CM | POA: Diagnosis not present

## 2022-07-29 DIAGNOSIS — Z79899 Other long term (current) drug therapy: Secondary | ICD-10-CM | POA: Diagnosis not present

## 2022-07-29 DIAGNOSIS — R030 Elevated blood-pressure reading, without diagnosis of hypertension: Secondary | ICD-10-CM | POA: Diagnosis not present

## 2022-08-27 ENCOUNTER — Other Ambulatory Visit: Payer: Self-pay | Admitting: Family

## 2022-08-27 DIAGNOSIS — F411 Generalized anxiety disorder: Secondary | ICD-10-CM

## 2022-08-27 DIAGNOSIS — F331 Major depressive disorder, recurrent, moderate: Secondary | ICD-10-CM

## 2022-08-29 ENCOUNTER — Encounter: Payer: Self-pay | Admitting: Family

## 2022-08-29 ENCOUNTER — Ambulatory Visit: Payer: Medicaid Other | Admitting: Family

## 2022-08-29 VITALS — BP 128/74 | HR 92 | Temp 97.9°F | Ht 69.0 in | Wt 206.4 lb

## 2022-08-29 DIAGNOSIS — M7070 Other bursitis of hip, unspecified hip: Secondary | ICD-10-CM | POA: Insufficient documentation

## 2022-08-29 DIAGNOSIS — F419 Anxiety disorder, unspecified: Secondary | ICD-10-CM

## 2022-08-29 DIAGNOSIS — G8929 Other chronic pain: Secondary | ICD-10-CM

## 2022-08-29 DIAGNOSIS — I10 Essential (primary) hypertension: Secondary | ICD-10-CM

## 2022-08-29 DIAGNOSIS — L304 Erythema intertrigo: Secondary | ICD-10-CM

## 2022-08-29 DIAGNOSIS — R739 Hyperglycemia, unspecified: Secondary | ICD-10-CM | POA: Diagnosis not present

## 2022-08-29 DIAGNOSIS — E782 Mixed hyperlipidemia: Secondary | ICD-10-CM | POA: Diagnosis not present

## 2022-08-29 DIAGNOSIS — F331 Major depressive disorder, recurrent, moderate: Secondary | ICD-10-CM

## 2022-08-29 DIAGNOSIS — M7071 Other bursitis of hip, right hip: Secondary | ICD-10-CM

## 2022-08-29 DIAGNOSIS — D582 Other hemoglobinopathies: Secondary | ICD-10-CM

## 2022-08-29 DIAGNOSIS — Z8659 Personal history of other mental and behavioral disorders: Secondary | ICD-10-CM | POA: Insufficient documentation

## 2022-08-29 DIAGNOSIS — K2100 Gastro-esophageal reflux disease with esophagitis, without bleeding: Secondary | ICD-10-CM | POA: Insufficient documentation

## 2022-08-29 DIAGNOSIS — K130 Diseases of lips: Secondary | ICD-10-CM

## 2022-08-29 DIAGNOSIS — R110 Nausea: Secondary | ICD-10-CM

## 2022-08-29 DIAGNOSIS — J301 Allergic rhinitis due to pollen: Secondary | ICD-10-CM

## 2022-08-29 DIAGNOSIS — F411 Generalized anxiety disorder: Secondary | ICD-10-CM

## 2022-08-29 DIAGNOSIS — Z79899 Other long term (current) drug therapy: Secondary | ICD-10-CM

## 2022-08-29 LAB — LIPID PANEL
Cholesterol: 169 mg/dL (ref 0–200)
HDL: 45.4 mg/dL (ref >39.00)
LDL Cholesterol: 98 mg/dL (ref 0–99)
NonHDL: 123.89
Total CHOL/HDL Ratio: 4
Triglycerides: 127 mg/dL (ref 0.0–149.0)
VLDL: 25.4 mg/dL (ref 0.0–40.0)

## 2022-08-29 LAB — CBC
HCT: 41.7 % (ref 36.0–46.0)
Hemoglobin: 14 g/dL (ref 12.0–15.0)
MCHC: 33.6 g/dL (ref 30.0–36.0)
MCV: 100.9 fl — ABNORMAL HIGH (ref 78.0–100.0)
Platelets: 235 10*3/uL (ref 150.0–400.0)
RBC: 4.13 Mil/uL (ref 3.87–5.11)
RDW: 15.3 % (ref 11.5–15.5)
WBC: 7.3 10*3/uL (ref 4.0–10.5)

## 2022-08-29 LAB — COMPREHENSIVE METABOLIC PANEL
ALT: 18 U/L (ref 0–35)
AST: 17 U/L (ref 0–37)
Albumin: 4.4 g/dL (ref 3.5–5.2)
Alkaline Phosphatase: 28 U/L — ABNORMAL LOW (ref 39–117)
BUN: 9 mg/dL (ref 6–23)
CO2: 30 mEq/L (ref 19–32)
Calcium: 9.7 mg/dL (ref 8.4–10.5)
Chloride: 103 mEq/L (ref 96–112)
Creatinine, Ser: 0.65 mg/dL (ref 0.40–1.20)
GFR: 102.89 mL/min (ref >60.00)
Glucose, Bld: 81 mg/dL (ref 70–99)
Potassium: 3.1 mEq/L — ABNORMAL LOW (ref 3.5–5.1)
Sodium: 141 mEq/L (ref 135–145)
Total Bilirubin: 0.4 mg/dL (ref 0.2–1.2)
Total Protein: 7.5 g/dL (ref 6.0–8.3)

## 2022-08-29 LAB — HEMOGLOBIN A1C: Hgb A1c MFr Bld: 6 % (ref 4.6–6.5)

## 2022-08-29 LAB — VITAMIN B12: Vitamin B-12: 186 pg/mL — ABNORMAL LOW (ref 211–911)

## 2022-08-29 MED ORDER — MUPIROCIN 2 % EX OINT
1.0000 | TOPICAL_OINTMENT | Freq: Two times a day (BID) | CUTANEOUS | 0 refills | Status: DC
Start: 2022-08-29 — End: 2022-10-25

## 2022-08-29 MED ORDER — CLOTRIMAZOLE-BETAMETHASONE 1-0.05 % EX CREA
1.0000 | TOPICAL_CREAM | Freq: Every day | CUTANEOUS | 0 refills | Status: DC
Start: 2022-08-29 — End: 2022-10-23

## 2022-08-29 MED ORDER — OMEPRAZOLE 20 MG PO CPDR
20.0000 mg | DELAYED_RELEASE_CAPSULE | Freq: Every day | ORAL | 3 refills | Status: AC
Start: 2022-08-29 — End: ?

## 2022-08-29 NOTE — Assessment & Plan Note (Signed)
Stable

## 2022-08-29 NOTE — Progress Notes (Signed)
Established Patient Office Visit  Subjective:  Patient ID: Erin Dorsey, female    DOB: Oct 01, 1970  Age: 52 y.o. MRN: [id number]  CC:  Chief Complaint  Patient presents with   Establish Care    TOC from Dr Selena Batten    HPI Erin Dorsey is here for a transition of care visit.  Prior provider was: Dr. Gweneth Dimitri    Pt is with acute concerns.   chronic concerns:  Bil sciatica with neuropathy: on gabapentin 300 mg three times daily.  Currently seeing pain management as well for chronic pain medication. Also on flexeril, tramadol as well as norco 10-325 mg    MDD: does state many years ago was diagnosed with bipolar. On buspar 15 mg tid, prozac 60 mg once daily. Anxiety is not controlled, often with increased anxiety states. Also on amitriptyline as well for depression and headaches.   Intertrigo: uses nystatin powder however now also in the corners of her mouth as well as bil lower breasts.   Gerd, worsening on otc omeprazole.   Past Medical History:  Diagnosis Date   Allergy    Anxiety    Cervical cancer (HCC) 2011   stage 3   Chronic headaches    Depression    Frequent falls    GERD (gastroesophageal reflux disease)    Lumbar radiculopathy     Past Surgical History:  Procedure Laterality Date   ANTERIOR AND POSTERIOR REPAIR N/A 04/12/2020   Procedure: ANTERIOR (CYSTOCELE) AND POSTERIOR REPAIR (RECTOCELE);  Surgeon: Linzie Collin, MD;  Location: [facility];  Service: Gynecology;  Laterality: N/A;   BLADDER SUSPENSION N/A 04/12/2020   Procedure: [facility] PROCEDURE;  Surgeon: Linzie Collin, MD;  Location: [facility];  Service: Gynecology;  Laterality: N/A;  TOT   COLONOSCOPY WITH PROPOFOL N/A 01/26/2021   Procedure: COLONOSCOPY WITH PROPOFOL;  Surgeon: Earline Mayotte, MD;  Location: [facility];  Service: Endoscopy;  Laterality: N/A;   COLPOSCOPY  2011 or 2012   stage 3-removed cancerous spot on the cervix   ESOPHAGOGASTRODUODENOSCOPY (EGD) WITH  PROPOFOL N/A 01/26/2021   Procedure: ESOPHAGOGASTRODUODENOSCOPY (EGD) WITH PROPOFOL;  Surgeon: Earline Mayotte, MD;  Location: [facility];  Service: Endoscopy;  Laterality: N/A;   EXCISION OF TONGUE LESION N/A 10/15/2019   Procedure: EXCISION OF TONGUE LESION;  Surgeon: Geanie Logan, MD;  Location: [facility];  Service: ENT;  Laterality: N/A;   FOOT SURGERY Right    x 2-took knots out from bottom of the foot-not sure of the technical name   LAPAROSCOPIC VAGINAL HYSTERECTOMY WITH SALPINGO OOPHORECTOMY Bilateral 04/12/2020   Procedure: LAPAROSCOPIC ASSISTED VAGINAL HYSTERECTOMY WITH SALPINGO OOPHORECTOMY;  Surgeon: Linzie Collin, MD;  Location: [facility];  Service: Gynecology;  Laterality: Bilateral;   PAROTIDECTOMY Right 10/15/2019   Procedure: PAROTIDECTOMY;  Surgeon: Geanie Logan, MD;  Location: [facility];  Service: ENT;  Laterality: Right;   TUBAL LIGATION      Family History  Problem Relation Age of Onset   Hypertension Mother    Kidney disease Father        was on dialysis   Uterine cancer Sister    Cervical cancer Sister    Deafness Daughter    Cervical cancer Maternal Grandmother    Lung cancer Maternal Grandfather    Brain cancer Maternal Grandfather    Thyroid disease Daughter    Breast cancer Paternal Aunt     Social History   Socioeconomic History   Marital status: Married    Spouse  name: [name]   Number of children: 3   Years of education: 7th grade   Highest education level: Not on file  Occupational History    Employer: Not Employed  Tobacco Use   Smoking status: Former    Packs/day: 0.25    Years: 20.00    Additional pack years: 0.00    Total pack years: 5.00    Types: Cigarettes    Quit date: 09/01/2019    Years since quitting: 2.9   Smokeless tobacco: Never   Tobacco comments:    not daily  Vaping Use   Vaping Use: Never used  Substance and Sexual Activity   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   Drug use: Yes    Frequency: 1.0 times  per week    Types: Marijuana    Comment: monthly   Sexual activity: Yes    Partners: Male    Birth control/protection: Surgical    Comment: tubal ligation  Other Topics Concern   Not on file  Social History Narrative   08/06/19   From: [place], came here to be close to daughter   Living: with husband [name] together since 2011 and married since 2021   Work: not currently, use to work at [employer]      Family: 3 daughter - in [place] - ok relationship - 1 grandchild / with [name] has 18 step grandchildren      Enjoys: walk, watch movies, painting, cross stitch      Exercise: walking   Diet: eats whatever      Safety   Seat belts: Yes    Guns: No   Safe in relationships: Yes    Social Determinants of Corporate investment banker Strain: Not on file  Food Insecurity: Not on file  Transportation Needs: Not on file  Physical Activity: Not on file  Stress: Not on file  Social Connections: Not on file  Intimate Partner Violence: Not on file    Outpatient Medications Prior to Visit  Medication Sig Dispense Refill   albuterol (VENTOLIN HFA) 108 (90 Base) MCG/ACT inhaler Inhale 2 puffs into the lungs every 6 (six) hours as needed for wheezing or shortness of breath. 8 g 2   amitriptyline (ELAVIL) 25 MG tablet Take 1 tablet (25 mg total) by mouth at bedtime. 90 tablet 0   busPIRone (BUSPAR) 15 MG tablet Take 1 tablet (15 mg total) by mouth 3 (three) times daily. Will need to be seen in office for more refills. 270 tablet 0   cyclobenzaprine (FLEXERIL) 10 MG tablet Take 1/2 to 1 tablet by mouth at bedtime. 90 tablet 0   FLUoxetine HCl 60 MG TABS Take 60 mg by mouth daily. Needs appt with PCP for further refills. 90 tablet 0   gabapentin (NEURONTIN) 300 MG capsule Take 300 mg by mouth 3 (three) times daily. 1 tablet in AM, 1 tablet at noon, then 3 tablets nightly     HYDROcodone-acetaminophen (NORCO) 10-325 MG tablet Take 1 tablet by mouth 3 (three) times daily as needed.      Hydrocortisone Acetate 1 % CREA Apply 1 application  topically daily as needed (heat rash). 15 g 0   hydrOXYzine (ATARAX) 25 MG tablet Take 1 tablet (25 mg total) by mouth at bedtime. 90 tablet 1   Lidocaine (ZTLIDO) 1.8 % PTCH Apply topically daily.     naloxone (NARCAN) nasal spray 4 mg/0.1 mL Place 1 spray into the nose as needed.  nystatin (MYCOSTATIN/NYSTOP) powder APPLY TO AFFECTED AREA 3 TIMES A DAY 15 g 5   ondansetron (ZOFRAN) 4 MG tablet TAKE 1 TABLET BY MOUTH EVERY 8 HOURS AS NEEDED FOR NAUSEA AND VOMITING 20 tablet 0   oxybutynin (DITROPAN-XL) 10 MG 24 hr tablet TAKE 1 TABLET BY MOUTH EVERYDAY AT BEDTIME 90 tablet 0   traMADol (ULTRAM) 50 MG tablet Take one po qd prn pain 15 tablet 0   valACYclovir (VALTREX) 500 MG tablet Take 2 tablets (1,000 mg total) by mouth daily. 90 tablet 1   cetirizine (ZYRTEC) 10 MG tablet Take 1 tablet (10 mg total) by mouth daily. 90 tablet 1   No facility-administered medications prior to visit.    Allergies  Allergen Reactions   Amoxicillin Rash   Penicillins Rash    ROS: Pertinent symptoms negative unless otherwise noted in HPI      Objective:    Physical Exam Vitals reviewed.  Constitutional:      General: She is not in acute distress.    Appearance: Normal appearance. She is obese. She is not ill-appearing or toxic-appearing.  HENT:     Right Ear: Tympanic membrane normal.     Left Ear: Tympanic membrane normal.     Mouth/Throat:     Mouth: Mucous membranes are moist.     Pharynx: No pharyngeal swelling.     Tonsils: No tonsillar exudate.  Eyes:     Extraocular Movements: Extraocular movements intact.     Conjunctiva/sclera: Conjunctivae normal.     Pupils: Pupils are equal, round, and reactive to light.  Neck:     Thyroid: No thyroid mass.  Cardiovascular:     Rate and Rhythm: Normal rate and regular rhythm.  Pulmonary:     Effort: Pulmonary effort is normal.     Breath sounds: Normal breath sounds.  Abdominal:      General: Abdomen is flat. Bowel sounds are normal.     Palpations: Abdomen is soft.  Musculoskeletal:        General: Normal range of motion.  Lymphadenopathy:     Cervical:     Right cervical: No superficial cervical adenopathy.    Left cervical: No superficial cervical adenopathy.  Skin:    General: Skin is warm.     Capillary Refill: Capillary refill takes less than 2 seconds.  Neurological:     General: No focal deficit present.     Mental Status: She is alert and oriented to person, place, and time.  Psychiatric:        Mood and Affect: Mood normal.        Behavior: Behavior normal.        Thought Content: Thought content normal.        Judgment: Judgment normal.       BP 128/74   Pulse 92   Temp 97.9 F (36.6 C) (Temporal)   Ht 5\' 9"  (1.753 m)   Wt 206 lb 6.4 oz (93.6 kg)   LMP 06/23/2015   SpO2 98%   BMI 30.48 kg/m  Wt Readings from Last 3 Encounters:  08/29/22 206 lb 6.4 oz (93.6 kg)  11/25/21 214 lb 2 oz (97.1 kg)  07/14/21 183 lb (83 kg)     Health Maintenance Due  Topic Date Due   PAP SMEAR-Modifier  08/25/2022    There are no preventive care reminders to display for this patient.  Lab Results  Component Value Date   TSH 1.64 11/25/2021   Lab Results  Component Value  Date   WBC 4.2 11/25/2021   HGB 16.0 (H) 11/25/2021   HCT 47.8 (H) 11/25/2021   MCV 100.4 (H) 11/25/2021   PLT 209.0 11/25/2021   Lab Results  Component Value Date   NA 140 11/25/2021   K 3.7 11/25/2021   CO2 23 11/25/2021   GLUCOSE 80 11/25/2021   BUN 8 11/25/2021   CREATININE 0.76 11/25/2021   BILITOT 0.7 11/25/2021   ALKPHOS 43 11/25/2021   AST 33 11/25/2021   ALT 32 11/25/2021   PROT 8.0 11/25/2021   ALBUMIN 4.8 11/25/2021   CALCIUM 9.9 11/25/2021   ANIONGAP 7 07/22/2015   GFR 92.06 11/25/2021   Lab Results  Component Value Date   CHOL 212 (H) 11/25/2021   Lab Results  Component Value Date   HDL 40.50 11/25/2021   Lab Results  Component Value Date    LDLCALC 134 (H) 11/25/2021   Lab Results  Component Value Date   TRIG 188.0 (H) 11/25/2021   Lab Results  Component Value Date   CHOLHDL 5 11/25/2021   Lab Results  Component Value Date   HGBA1C 5.4 08/06/2019      Assessment & Plan:   Chronic nausea  Elevated hemoglobin (HCC) -     Vitamin B12 -     CBC  Hyperglycemia -     Comprehensive metabolic panel -     Hemoglobin A1c  Essential hypertension Assessment & Plan: Stable  Orders: -     Comprehensive metabolic panel  Mixed hyperlipidemia -     Hemoglobin A1c -     Lipid panel  Polypharmacy Assessment & Plan: Worry for this with combination of medications Referral placed to pharmacist for eval Also sending pt to psychiatry for appropriate evaluation/treat as possible bipolar in the past and worsening anxiety on current medication regimen  Orders: -     AMB Referral to Community Care Coordinaton ([facility] Patients) -     Ambulatory referral to Psychiatry  History of bipolar disorder -     Ambulatory referral to Psychiatry  Generalized anxiety disorder -     Ambulatory referral to Psychiatry  Recurrent moderate major depressive disorder with anxiety (HCC) -     Ambulatory referral to Psychiatry  Intertrigo Assessment & Plan: Stop nystatin for now  Start lotrisone cream sent to pharmacy to see if improvement Advised to keep area dry as able  Orders: -     Clotrimazole-Betamethasone; Apply 1 Application topically daily.  Dispense: 30 g; Refill: 0  Chronic bilateral low back pain with bilateral sciatica  Bursitis of other bursa of right hip  Gastroesophageal reflux disease with esophagitis without hemorrhage -     Omeprazole; Take 1 capsule (20 mg total) by mouth daily.  Dispense: 30 capsule; Refill: 3  Cheilitis Assessment & Plan: Worsening Will assess b12 pending labs  Rx mupirocin apply at night time and clotrimazole in am.  See fungal vs bacterial   Orders: -     Mupirocin; Apply 1  Application topically 2 (two) times daily.  Dispense: 22 g; Refill: 0  Seasonal allergic rhinitis due to pollen Assessment & Plan: Zyrtec prn      Meds ordered this encounter  Medications   clotrimazole-betamethasone (LOTRISONE) cream    Sig: Apply 1 Application topically daily.    Dispense:  30 g    Refill:  0    Order Specific Question:   Supervising Provider    Answer:   BEDSOLE, AMY E [[id number]]   omeprazole (PRILOSEC) 20  MG capsule    Sig: Take 1 capsule (20 mg total) by mouth daily.    Dispense:  30 capsule    Refill:  3    Order Specific Question:   Supervising Provider    Answer:   BEDSOLE, AMY E [[id number]]   mupirocin ointment (BACTROBAN) 2 %    Sig: Apply 1 Application topically 2 (two) times daily.    Dispense:  22 g    Refill:  0    Order Specific Question:   Supervising Provider    Answer:   Ermalene Searing, AMY E [[id number]]    Follow-up: Return in about 6 months (around 02/28/2023) for f/u anxiety, f/u depression, f/u cholesterol, f/u CPE.    Mort Sawyers, FNP

## 2022-08-29 NOTE — Assessment & Plan Note (Signed)
Zyrtec prn  

## 2022-08-29 NOTE — Patient Instructions (Addendum)
  Get some over the counter clotrimazole cream and apply to the corners of your lips.  Sending in a new cream for your rash under your breasts.  Using something on a daily basis such as body glide for her (found on amazon) to try to keep dry.   A referral was placed today to the pharmacist as well as to psychiatry for evaluation of your medications.  Please let us know if you have not heard back within 2 weeks about the referral.   Regards,     FNP-C

## 2022-08-29 NOTE — Assessment & Plan Note (Signed)
Worry for this with combination of medications Referral placed to pharmacist for eval Also sending pt to psychiatry for appropriate evaluation/treat as possible bipolar in the past and worsening anxiety on current medication regimen

## 2022-08-29 NOTE — Assessment & Plan Note (Signed)
Worsening Will assess b12 pending labs  Rx mupirocin apply at night time and clotrimazole in am.  See fungal vs bacterial

## 2022-08-29 NOTE — Assessment & Plan Note (Signed)
Stop nystatin for now  Start lotrisone cream sent to pharmacy to see if improvement Advised to keep area dry as able

## 2022-08-30 ENCOUNTER — Other Ambulatory Visit: Payer: Self-pay | Admitting: Family

## 2022-08-30 DIAGNOSIS — E876 Hypokalemia: Secondary | ICD-10-CM

## 2022-08-30 MED ORDER — POTASSIUM CHLORIDE CRYS ER 20 MEQ PO TBCR
20.0000 meq | EXTENDED_RELEASE_TABLET | Freq: Two times a day (BID) | ORAL | 0 refills | Status: DC
Start: 2022-08-30 — End: 2022-11-30

## 2022-08-30 NOTE — Progress Notes (Signed)
Vitamin B12 very low, please set up for B12 injections, 1000 mcg IM once monthly for three months, also schedule 3 month f/u appt to repeat labs and f/u in office. Recommend also oral otc 1000 mcg once daily vitamin B12  Potassium on lower end.sending in potassium 20 meq twice daily to take for seven days to replenish.   You are prediabetic as well, work on diabetic diet

## 2022-08-31 ENCOUNTER — Encounter: Payer: Self-pay | Admitting: Family

## 2022-08-31 DIAGNOSIS — F411 Generalized anxiety disorder: Secondary | ICD-10-CM

## 2022-08-31 DIAGNOSIS — F331 Major depressive disorder, recurrent, moderate: Secondary | ICD-10-CM

## 2022-08-31 MED ORDER — BUSPIRONE HCL 15 MG PO TABS
15.0000 mg | ORAL_TABLET | Freq: Three times a day (TID) | ORAL | 3 refills | Status: DC
Start: 2022-08-31 — End: 2023-02-09

## 2022-09-04 ENCOUNTER — Other Ambulatory Visit: Payer: Self-pay | Admitting: Family

## 2022-09-05 ENCOUNTER — Ambulatory Visit (INDEPENDENT_AMBULATORY_CARE_PROVIDER_SITE_OTHER): Payer: Medicaid Other

## 2022-09-05 DIAGNOSIS — E538 Deficiency of other specified B group vitamins: Secondary | ICD-10-CM

## 2022-09-05 MED ORDER — CYANOCOBALAMIN 1000 MCG/ML IJ SOLN
1000.0000 ug | Freq: Once | INTRAMUSCULAR | Status: AC
Start: 2022-09-05 — End: 2022-09-05
  Administered 2022-09-05: 1000 ug via INTRAMUSCULAR

## 2022-09-05 NOTE — Progress Notes (Signed)
Per orders of Mort Sawyers, NP, #1 of 3 monthly injection of B12 1000 mcg/ml given by Eual Fines, in Left Deltoid. Patient tolerated injection well.  Sending to Dr Ermalene Searing in Tabitha's absence.

## 2022-09-07 ENCOUNTER — Encounter: Payer: Self-pay | Admitting: Family

## 2022-09-08 ENCOUNTER — Other Ambulatory Visit: Payer: Self-pay | Admitting: Family

## 2022-09-08 DIAGNOSIS — F411 Generalized anxiety disorder: Secondary | ICD-10-CM

## 2022-09-08 DIAGNOSIS — F331 Major depressive disorder, recurrent, moderate: Secondary | ICD-10-CM

## 2022-09-08 NOTE — Telephone Encounter (Signed)
  FLUoxetine HCl 60 MG TABS   LV- 08/29/22 LR- 04/27/21 NV-  10/10/22

## 2022-09-13 ENCOUNTER — Telehealth: Payer: Self-pay

## 2022-09-13 NOTE — Progress Notes (Signed)
   Care Guide Note  09/13/2022 Name: DALAINA NOYER MRN: [id number] DOB: 04/27/1971  Referred by: Mort Sawyers, FNP Reason for referral : Care Coordination (Outreach to schedule with Pharm d )   SHANTICE SCHEPPS is a 52 y.o. year old female who is a primary care patient of Mort Sawyers, FNP. [name] was referred to the pharmacist for assistance related to HTN.    Successful contact was made with the patient to discuss pharmacy services including being ready for the pharmacist to call at least 5 minutes before the scheduled appointment time, to have medication bottles and any blood sugar or blood pressure readings ready for review. The patient agreed to meet with the pharmacist via with the pharmacist via telephone visit on (date/time).  10/06/2022  Penne Lash, RMA Care Guide [facility]  [place], [place] [address] Direct Dial: [phone] [email or web address]

## 2022-09-21 ENCOUNTER — Telehealth: Payer: Self-pay

## 2022-09-21 ENCOUNTER — Other Ambulatory Visit: Payer: Self-pay

## 2022-09-21 NOTE — Telephone Encounter (Signed)
PA initiated via Covermymeds; KEY: [id number]. Awaiting determination.

## 2022-09-22 ENCOUNTER — Telehealth: Payer: Self-pay

## 2022-09-22 NOTE — Telephone Encounter (Signed)
Pharmacy Patient Advocate Encounter   Prior Authorization for Fluoxetine has been approved by [employer] (ins).     PA # n/a Effective dates: 09/21/2022 through 09/21/2023 

## 2022-09-23 ENCOUNTER — Other Ambulatory Visit: Payer: Self-pay | Admitting: Family

## 2022-09-23 DIAGNOSIS — A6000 Herpesviral infection of urogenital system, unspecified: Secondary | ICD-10-CM

## 2022-09-23 DIAGNOSIS — F411 Generalized anxiety disorder: Secondary | ICD-10-CM

## 2022-09-27 NOTE — Telephone Encounter (Signed)
Refill- valACYclovir (VALTREX) 500 MG tablet (LR- 04/04/22);  hydrOXYzine (ATARAX) 25 MG tablet (LR- 11/25/21)  LV- 08/29/22 NV- 10/10/22

## 2022-09-27 NOTE — Telephone Encounter (Signed)
Pharmacy Patient Advocate Encounter   Prior Authorization for Fluoxetine has been approved by [employer] (ins).     PA # n/a Effective dates: 09/21/2022 through 09/21/2023

## 2022-09-28 MED ORDER — HYDROXYZINE HCL 25 MG PO TABS: 25.0000 mg | ORAL_TABLET | Freq: Every evening | ORAL | 1 refills | Status: DC

## 2022-10-01 ENCOUNTER — Emergency Department: Payer: Medicaid Other

## 2022-10-01 ENCOUNTER — Encounter: Payer: Self-pay | Admitting: Emergency Medicine

## 2022-10-01 ENCOUNTER — Other Ambulatory Visit: Payer: Self-pay

## 2022-10-01 ENCOUNTER — Emergency Department
Admission: EM | Admit: 2022-10-01 | Discharge: 2022-10-01 | Disposition: A | Discharge status: Home / Self Care | Payer: Medicaid Other | Attending: Emergency Medicine | Admitting: Emergency Medicine

## 2022-10-01 DIAGNOSIS — J069 Acute upper respiratory infection, unspecified: Secondary | ICD-10-CM | POA: Insufficient documentation

## 2022-10-01 DIAGNOSIS — Z1152 Encounter for screening for COVID-19: Secondary | ICD-10-CM | POA: Insufficient documentation

## 2022-10-01 DIAGNOSIS — R509 Fever, unspecified: Secondary | ICD-10-CM | POA: Diagnosis present

## 2022-10-01 DIAGNOSIS — R1011 Right upper quadrant pain: Secondary | ICD-10-CM | POA: Diagnosis not present

## 2022-10-01 LAB — CBC WITH DIFFERENTIAL/PLATELET
Abs Immature Granulocytes: 0.01 10*3/uL (ref 0.00–0.07)
Basophils Absolute: 0 10*3/uL (ref 0.0–0.1)
Basophils Relative: 0 %
Eosinophils Absolute: 0 10*3/uL (ref 0.0–0.5)
Eosinophils Relative: 0 %
HCT: 43.5 % (ref 36.0–46.0)
Hemoglobin: 14.4 g/dL (ref 12.0–15.0)
Immature Granulocytes: 0 %
Lymphocytes Relative: 22 %
Lymphs Abs: 1.1 10*3/uL (ref 0.7–4.0)
MCH: 33.6 pg (ref 26.0–34.0)
MCHC: 33.1 g/dL (ref 30.0–36.0)
MCV: 101.4 fL — ABNORMAL HIGH (ref 80.0–100.0)
Monocytes Absolute: 0.3 10*3/uL (ref 0.1–1.0)
Monocytes Relative: 5 %
Neutro Abs: 3.7 10*3/uL (ref 1.7–7.7)
Neutrophils Relative %: 73 %
Platelets: 201 10*3/uL (ref 150–400)
RBC: 4.29 MIL/uL (ref 3.87–5.11)
RDW: 14.1 % (ref 11.5–15.5)
WBC: 5.1 10*3/uL (ref 4.0–10.5)
nRBC: 0 % (ref 0.0–0.2)

## 2022-10-01 LAB — URINALYSIS, ROUTINE W REFLEX MICROSCOPIC
Bilirubin Urine: NEGATIVE
Glucose, UA: NEGATIVE mg/dL
Hgb urine dipstick: NEGATIVE
Ketones, ur: 20 mg/dL — AB
Leukocytes,Ua: NEGATIVE
Nitrite: NEGATIVE
Protein, ur: NEGATIVE mg/dL
Specific Gravity, Urine: 1.016 (ref 1.005–1.030)
pH: 7 (ref 5.0–8.0)

## 2022-10-01 LAB — COMPREHENSIVE METABOLIC PANEL
ALT: 18 U/L (ref 0–44)
AST: 22 U/L (ref 15–41)
Albumin: 4.2 g/dL (ref 3.5–5.0)
Alkaline Phosphatase: 30 U/L — ABNORMAL LOW (ref 38–126)
Anion gap: 9 (ref 5–15)
BUN: 9 mg/dL (ref 6–20)
CO2: 26 mmol/L (ref 22–32)
Calcium: 9 mg/dL (ref 8.9–10.3)
Chloride: 105 mmol/L (ref 98–111)
Creatinine, Ser: 0.61 mg/dL (ref 0.44–1.00)
GFR, Estimated: >60 mL/min (ref >60)
Glucose, Bld: 114 mg/dL — ABNORMAL HIGH (ref 70–99)
Potassium: 3.1 mmol/L — ABNORMAL LOW (ref 3.5–5.1)
Sodium: 140 mmol/L (ref 135–145)
Total Bilirubin: 0.8 mg/dL (ref 0.3–1.2)
Total Protein: 7.5 g/dL (ref 6.5–8.1)

## 2022-10-01 LAB — SARS CORONAVIRUS 2 BY RT PCR: SARS Coronavirus 2 by RT PCR: NEGATIVE

## 2022-10-01 LAB — LIPASE, BLOOD: Lipase: 27 U/L (ref 11–51)

## 2022-10-01 MED ORDER — METOCLOPRAMIDE HCL 5 MG/ML IJ SOLN
10.0000 mg | Freq: Once | INTRAMUSCULAR | Status: AC
  Administered 2022-10-01: 10 mg via INTRAVENOUS
  Filled 2022-10-01: qty 2

## 2022-10-01 MED ORDER — DOXYCYCLINE HYCLATE 100 MG PO TABS: 100.0000 mg | ORAL_TABLET | Freq: Two times a day (BID) | ORAL | 0 refills | Status: DC

## 2022-10-01 MED ORDER — SODIUM CHLORIDE 0.9 % IV BOLUS
1000.0000 mL | Freq: Once | INTRAVENOUS | Status: AC
  Administered 2022-10-01: 1000 mL via INTRAVENOUS

## 2022-10-01 MED ORDER — ONDANSETRON 4 MG PO TBDP: 4.0000 mg | ORAL_TABLET | Freq: Three times a day (TID) | ORAL | 0 refills | Status: DC | PRN

## 2022-10-01 MED ORDER — GADOBUTROL 1 MMOL/ML IV SOLN
10.0000 mL | Freq: Once | INTRAVENOUS | Status: AC | PRN
  Administered 2022-10-01: 10 mL via INTRAVENOUS

## 2022-10-01 NOTE — ED Provider Notes (Signed)
Shared visit.  Patient with subjective fever at home.  Abdominal pain with nausea, vomiting, cough and congestion.  Right upper quadrant ultrasound with chronic gallstones with common bile duct that was dilated at 9 mm concerning for possible choledocholithiasis.  Ordered MRCP for further evaluation.  Patient afebrile in the emergency department.  Normal LFTs and T. bili.  MRCP without findings of choledocholithiasis.  Chronic kidney stones but no signs of acute cholecystitis.  Most likely with viral illness.  COVID testing is negative.  Discharge home with outpatient follow-up.  Given return precautions.   Corena Herter, MD 10/01/22 1046

## 2022-10-01 NOTE — ED Provider Notes (Signed)
[facility] Provider Note    Event Date/Time   First MD Initiated Contact with Patient 10/01/22 0715     (approximate)   History   Fever   HPI  AONESTY SIMSON is a 52 y.o. female history of GERD, chronic pain, lumbar radiculopathy, chronic headaches presents emergency department with headache, fever, nausea and vomiting and abdominal pain since Thursday.  Patient states she feels dehydrated.  Unsure if she has been running 1 else that is sick.  Did not do a home COVID test.  Has not taken anything for her symptoms as she states she takes chronic pain medication.  Patient does still have her gallbladder and has a history of gallstones      Physical Exam   Triage Vital Signs: ED Triage Vitals  Enc Vitals Group     BP 10/01/22 0711 (!) 137/100     Pulse Rate 10/01/22 0711 78     Resp 10/01/22 0711 20     Temp 10/01/22 0711 98.3 F (36.8 C)     Temp Source 10/01/22 0711 Oral     SpO2 10/01/22 0711 99 %     Weight 10/01/22 0705 220 lb (99.8 kg)     Height 10/01/22 0705 5\' 9"  (1.753 m)     Head Circumference --      Peak Flow --      Pain Score 10/01/22 0705 10     Pain Loc --      Pain Edu? --      Excl. in GC? --     Most recent vital signs: Vitals:   10/01/22 0711  BP: (!) 137/100  Pulse: 78  Resp: 20  Temp: 98.3 F (36.8 C)  SpO2: 99%     General: Awake, no distress.   CV:  Good peripheral perfusion. regular rate and  rhythm Resp:  Normal effort. Lungs cta Abd:  No distention.  Tender in the right upper quadrant Other:      ED Results / Procedures / Treatments   Labs (all labs ordered are listed, but only abnormal results are displayed) Labs Reviewed  COMPREHENSIVE METABOLIC PANEL - Abnormal; Notable for the following components:      Result Value   Potassium 3.1 (*)    Glucose, Bld 114 (*)    Alkaline Phosphatase 30 (*)    All other components within normal limits  CBC WITH DIFFERENTIAL/PLATELET - Abnormal; Notable  for the following components:   MCV 101.4 (*)    All other components within normal limits  URINALYSIS, ROUTINE W REFLEX MICROSCOPIC - Abnormal; Notable for the following components:   Color, Urine YELLOW (*)    APPearance HAZY (*)    Ketones, ur 20 (*)    All other components within normal limits  SARS CORONAVIRUS 2 BY RT PCR  LIPASE, BLOOD     EKG     RADIOLOGY Ultrasound right upper quadrant    PROCEDURES:   Procedures   MEDICATIONS ORDERED IN ED: Medications  sodium chloride 0.9 % bolus 1,000 mL (1,000 mLs Intravenous New Bag/Given 10/01/22 0749)  metoCLOPramide (REGLAN) injection 10 mg (10 mg Intravenous Given 10/01/22 0750)  gadobutrol (GADAVIST) 1 MMOL/ML injection 10 mL (10 mLs Intravenous Contrast Given 10/01/22 0945)     IMPRESSION / MDM / ASSESSMENT AND PLAN / ED COURSE  I reviewed the triage vital signs and the nursing notes.  Differential diagnosis includes, but is not limited to, COVID, acute cholecystitis, pancreatitis, gastroenteritis, pyelonephritis, UTI  Patient's presentation is most consistent with acute presentation with potential threat to life or bodily function.   Labs and imaging ordered, patient be given normal saline 1 L IV along with Reglan for nausea   Labs are reassuring, Ultrasound right upper quadrant concerning for choledocholithiasis, this was independently reviewed and interpreted by me.  Discussed with Dr. Arnoldo Morale, she will going to see the patient.  States we need a MRCP.  She did try to move the patient to the major side due to time constraints and care but there are no beds at this time.  MRCP independently reviewed and interpreted by me as being negative for acute abnormality,  Patient will be discharged with antibiotic and Zofran for nausea and vomiting.  Follow-up with your regular doctor in 2 to 3 days if worsening or not improving.  Return if worsening.  She is in agreement treatment plan.  She was  discharged with a work note.  She is in stable condition at the time and in agreement with treatment plan.   FINAL CLINICAL IMPRESSION(S) / ED DIAGNOSES   Final diagnoses:  Acute URI  RUQ pain     Rx / DC Orders   ED Discharge Orders          Ordered    doxycycline (VIBRA-TABS) 100 MG tablet  2 times daily        10/01/22 1048    ondansetron (ZOFRAN-ODT) 4 MG disintegrating tablet  Every 8 hours PRN        10/01/22 1048             Note:  This document was prepared using Dragon voice recognition software and may include unintentional dictation errors.    Faythe Ghee, PA-C 10/01/22 1050    Corena Herter, MD 10/01/22 [phone]

## 2022-10-01 NOTE — Discharge Instructions (Signed)
Follow-up with your regular doctor if not improving in 2 days.  Return if worsening.  Take medication as prescribed

## 2022-10-01 NOTE — ED Notes (Signed)
See triage note  Presents with some upper abd pain with some n/v/ and fever   States sxs' started about 2-3 days ago Afebrile on arrival

## 2022-10-05 ENCOUNTER — Telehealth: Payer: Self-pay | Admitting: *Deleted

## 2022-10-05 NOTE — Transitions of Care (Post Inpatient/ED Visit) (Signed)
   10/05/2022  Name: Erin Dorsey MRN: [id number] DOB: 1971/02/08  Today's TOC FU Call Status: Today's TOC FU Call Status:: Unsuccessul Call (1st Attempt) Unsuccessful Call (1st Attempt) Date: 10/05/22  Attempted to reach the patient regarding the most recent Inpatient/ED visit.  Follow Up Plan: Additional outreach attempts will be made to reach the patient to complete the Transitions of Care (Post Inpatient/ED visit) call.   Gean Maidens BSN RN [facility] [phone]

## 2022-10-06 ENCOUNTER — Telehealth: Payer: Self-pay | Admitting: Pharmacist

## 2022-10-06 ENCOUNTER — Telehealth: Payer: Self-pay | Admitting: *Deleted

## 2022-10-06 ENCOUNTER — Other Ambulatory Visit: Payer: Medicaid Other | Admitting: Pharmacist

## 2022-10-06 NOTE — Transitions of Care (Post Inpatient/ED Visit) (Signed)
   10/06/2022  Name: Erin Dorsey MRN: [id number] DOB: 1971-01-14  Today's TOC FU Call Status: Today's TOC FU Call Status:: Unsuccessful Call (2nd Attempt) Unsuccessful Call (2nd Attempt) Date: 10/06/22  Attempted to reach the patient regarding the most recent Inpatient/ED visit.  Follow Up Plan: Additional outreach attempts will be made to reach the patient to complete the Transitions of Care (Post Inpatient/ED visit) call.   Gean Maidens BSN RN [facility] [phone]

## 2022-10-06 NOTE — Progress Notes (Unsigned)
Attempted to contact patient for scheduled appointment for medication management. Left HIPAA compliant message for patient to return my call at their convenience.   Will send MyChart.  Catie T. Amaro Mangold, PharmD, BCACP, CPP [facility] [phone]  

## 2022-10-06 NOTE — Transitions of Care (Post Inpatient/ED Visit) (Signed)
10/06/2022  Name: Erin Dorsey MRN: [id number] DOB: 01-Feb-1971  Today's TOC FU Call Status: Today's TOC FU Call Status:: Successful TOC FU Call Competed TOC FU Call Complete Date: 10/06/22  Transition Care Management Follow-up Telephone Call Date of Discharge: 10/01/22 Discharge Facility: [facility]) Type of Discharge: Emergency Department Reason for ED Visit: Other:, Respiratory Respiratory Diagnosis:  (URI) How have you been since you were released from the hospital?: Same  Items Reviewed: Medications obtained,verified, and reconciled?: Yes (Medications Reviewed) Any new allergies since your discharge?: No Dietary orders reviewed?: No Do you have support at home?: Yes People in Home: spouse Name of Support/Comfort Primary Source: [name]  Medications Reviewed Today: Medications Reviewed Today     Reviewed by Luella Cook, RN (Case Manager) on 10/06/22 at 1104  Med List Status: <None>   Medication Order Taking? Sig Documenting Provider Last Dose Status Informant  albuterol (VENTOLIN HFA) 108 (90 Base) MCG/ACT inhaler [id number] Yes Inhale 2 puffs into the lungs every 6 (six) hours as needed for wheezing or shortness of breath. Gweneth Dimitri, MD Taking Active   amitriptyline (ELAVIL) 25 MG tablet [id number] Yes Take 1 tablet (25 mg total) by mouth at bedtime. Worthy Rancher B, FNP Taking Active   busPIRone (BUSPAR) 15 MG tablet [id number] Yes Take 1 tablet (15 mg total) by mouth 3 (three) times daily. Will need to be seen in office for more refills. Mort Sawyers, FNP Taking Active   clotrimazole-betamethasone (LOTRISONE) cream [id number] Yes Apply 1 Application topically daily. Mort Sawyers, FNP Taking Active   cyclobenzaprine (FLEXERIL) 10 MG tablet [id number] Yes Take 1/2 to 1 tablet by mouth at bedtime. Worthy Rancher B, FNP Taking Active   doxycycline (VIBRA-TABS) 100 MG tablet [id number] Yes Take 1 tablet (100 mg total) by mouth 2 (two)  times daily. Faythe Ghee, PA-C Taking Active   FLUoxetine HCl 60 MG TABS [id number] Yes TAKE 1 TABLET BY MOUTH DAILY. NEEDS APPT WITH PCP FOR FURTHER REFILLS. Mort Sawyers, FNP Taking Active   gabapentin (NEURONTIN) 300 MG capsule [id number] Yes Take 300 mg by mouth 3 (three) times daily. 1 tablet in AM, 1 tablet at noon, then 3 tablets nightly [provider] Taking Active Self  HYDROcodone-acetaminophen (NORCO) 10-325 MG tablet [id number] Yes Take 1 tablet by mouth 3 (three) times daily as needed. [provider] Taking Active   Hydrocortisone Acetate 1 % CREA [id number] Yes Apply 1 application  topically daily as needed (heat rash). Gweneth Dimitri, MD Taking Active   hydrOXYzine (ATARAX) 25 MG tablet [id number] Yes Take 1 tablet (25 mg total) by mouth at bedtime. Mort Sawyers, FNP Taking Active   Lidocaine (ZTLIDO) 1.8 % PTCH [id number] Yes Apply topically daily. [provider] Taking Active   mupirocin ointment (BACTROBAN) 2 % [id number] Yes Apply 1 Application topically 2 (two) times daily. Mort Sawyers, FNP Taking Active   naloxone [facility]) nasal spray 4 mg/0.1 mL [id number] Yes Place 1 spray into the nose as needed. [provider] Taking Active   nystatin (MYCOSTATIN/NYSTOP) powder [id number] Yes APPLY TO AFFECTED AREA 3 TIMES A Burnice Logan, MD Taking Active   omeprazole (PRILOSEC) 20 MG capsule [id number] Yes Take 1 capsule (20 mg total) by mouth daily. Mort Sawyers, FNP Taking Active   ondansetron (ZOFRAN) 4 MG tablet [id number] Yes TAKE 1 TABLET BY MOUTH EVERY 8 HOURS AS NEEDED FOR NAUSEA AND VOMITING Worthy Rancher B, FNP Taking Active   ondansetron (ZOFRAN-ODT) 4 MG disintegrating tablet [id number] Yes  Take 1 tablet (4 mg total) by mouth every 8 (eight) hours as needed. Faythe Ghee, PA-C Taking Active   oxybutynin (DITROPAN-XL) 10 MG 24 hr tablet [id number] Yes TAKE 1 TABLET BY MOUTH EVERYDAY AT BEDTIME Linzie Collin, MD Taking Active    potassium chloride SA (KLOR-CON M) 20 MEQ tablet [id number]  Take 1 tablet (20 mEq total) by mouth 2 (two) times daily for 7 days. Mort Sawyers, FNP  Expired 09/06/22 2359   traMADol (ULTRAM) 50 MG tablet [id number] Yes Take one po qd prn pain Mort Sawyers, FNP Taking Active   valACYclovir (VALTREX) 500 MG tablet [id number] Yes TAKE 2 TABLETS BY MOUTH EVERY DAY Mort Sawyers, FNP Taking Active             Home Care and Equipment/Supplies: Were Home Health Services Ordered?: NA Any new equipment or medical supplies ordered?: NA  Functional Questionnaire: Do you need assistance with bathing/showering or dressing?: No Do you need assistance with meal preparation?: No Do you need assistance with eating?: No Do you have difficulty maintaining continence: No Do you need assistance with getting out of bed/getting out of a chair/moving?: No Do you have difficulty managing or taking your medications?: No  Follow up appointments reviewed: PCP Follow-up appointment confirmed?: Yes Date of PCP follow-up appointment?: 10/11/22 Follow-up Provider: [facility] Sawyers [facility] Follow-up appointment confirmed?: NA Do you need transportation to your follow-up appointment?: No Do you understand care options if your condition(s) worsen?: Yes-patient verbalized understanding  SDOH Interventions Today    Flowsheet Row Most Recent Value  SDOH Interventions   Food Insecurity Interventions Intervention Not Indicated  Housing Interventions Intervention Not Indicated  Transportation Interventions Intervention Not Indicated      TOC Interventions Today    Flowsheet Row Most Recent Value  TOC Interventions   TOC Interventions Discussed/Reviewed TOC Interventions Discussed, TOC Interventions Reviewed, Arranged PCP follow up less than 12 days/Care Guide scheduled      Interventions Today    Flowsheet Row Most Recent Value  General Interventions   General Interventions  Discussed/Reviewed General Interventions Discussed, General Interventions Reviewed, Doctor Visits  Doctor Visits Discussed/Reviewed Doctor Visits Discussed, Doctor Visits Reviewed  Pharmacy Interventions   Pharmacy Dicussed/Reviewed Pharmacy Topics Discussed        Gean Maidens BSN RN [facility] [phone]

## 2022-10-09 ENCOUNTER — Other Ambulatory Visit: Payer: Medicaid Other | Admitting: Pharmacist

## 2022-10-10 ENCOUNTER — Ambulatory Visit (INDEPENDENT_AMBULATORY_CARE_PROVIDER_SITE_OTHER): Payer: Medicaid Other | Admitting: *Deleted

## 2022-10-10 DIAGNOSIS — E538 Deficiency of other specified B group vitamins: Secondary | ICD-10-CM

## 2022-10-10 MED ORDER — CYANOCOBALAMIN 1000 MCG/ML IJ SOLN
1000.0000 ug | Freq: Once | INTRAMUSCULAR | Status: AC
Start: 2022-10-10 — End: 2022-10-10
  Administered 2022-10-10: 1000 ug via INTRAMUSCULAR

## 2022-10-10 NOTE — Progress Notes (Signed)
Per orders of Mort Sawyers FNP, injection of Vitamin B 12 given by Sydell Axon. Patient tolerated injection well.

## 2022-10-11 ENCOUNTER — Ambulatory Visit: Payer: Medicaid Other | Admitting: Family

## 2022-10-13 ENCOUNTER — Other Ambulatory Visit: Payer: Self-pay | Admitting: Family

## 2022-10-13 DIAGNOSIS — F411 Generalized anxiety disorder: Secondary | ICD-10-CM

## 2022-10-13 DIAGNOSIS — F331 Major depressive disorder, recurrent, moderate: Secondary | ICD-10-CM

## 2022-10-13 MED ORDER — FLUOXETINE HCL 60 MG PO TABS
60.0000 mg | ORAL_TABLET | Freq: Every day | ORAL | 3 refills | Status: DC
Start: 2022-10-13 — End: 2023-12-31

## 2022-10-13 MED ORDER — GABAPENTIN 300 MG PO CAPS: 300.0000 mg | ORAL_CAPSULE | Freq: Three times a day (TID) | ORAL | 5 refills | Status: DC

## 2022-10-20 ENCOUNTER — Ambulatory Visit: Payer: Medicaid Other | Admitting: Family

## 2022-10-22 ENCOUNTER — Other Ambulatory Visit: Payer: Self-pay | Admitting: Family

## 2022-10-22 DIAGNOSIS — L304 Erythema intertrigo: Secondary | ICD-10-CM

## 2022-10-25 ENCOUNTER — Other Ambulatory Visit: Payer: Self-pay | Admitting: Family

## 2022-10-25 DIAGNOSIS — K130 Diseases of lips: Secondary | ICD-10-CM

## 2022-10-25 DIAGNOSIS — R110 Nausea: Secondary | ICD-10-CM

## 2022-10-25 DIAGNOSIS — E876 Hypokalemia: Secondary | ICD-10-CM

## 2022-11-09 ENCOUNTER — Other Ambulatory Visit: Payer: Self-pay | Admitting: Family

## 2022-11-09 DIAGNOSIS — K2100 Gastro-esophageal reflux disease with esophagitis, without bleeding: Secondary | ICD-10-CM

## 2022-11-09 DIAGNOSIS — K130 Diseases of lips: Secondary | ICD-10-CM

## 2022-11-10 ENCOUNTER — Ambulatory Visit: Payer: Self-pay | Admitting: Surgery

## 2022-11-10 NOTE — H&P (Signed)
History of Present Illness: Erin Dorsey is a 52 y.o. female who is seen today as an office consultation for evaluation of New Consultation (gallbladder)     She was seen in the ED on 6/2 with abdominal pain, nausea and vomiting. She had a RUQ [place] that showed cholelithiasis and mild intrahepatic bile duct dilation. She had an MRCP that showed the CBD was 7mm in diameter with no filling defects and no pancreatic head lesions. LFTs were normal. She was referred to discuss cholecystectomy. She says that she has been having intermittent abdominal pain for a while. It is in the epigastric area and RUQ, and worsens after eating, and she sometimes has nausea and vomiting. She has been trying to eat bland foods to help minimize symptoms.   Prior abdominal surgeries include a tubal ligation and bladder suspension.        Review of Systems: A complete review of systems was obtained from the patient.  I have reviewed this information and discussed as appropriate with the patient.  See HPI as well for other ROS.     Medical History: Past Medical History Past Medical History: Diagnosis Date  Anxiety    Cervical cancer (CMS/HHS-HCC) 2011  Chronic headache    Depression    Fall    GERD (gastroesophageal reflux disease)    Lumbar radiculopathy         Problem List There is no problem list on file for this patient.     Past Surgical History Past Surgical History: Procedure Laterality Date  PAROTIDECTOMY Right 10/15/2019  EXCISION TONGUE LESION   10/15/2019  LAPAROSCOPIC VAGINAL HYSTERECTOMY Bilateral 04/12/2020   salpingo oophorectomy, no malignancy identified  bladder suspension   04/12/2020  COLONOSCOPY   01/26/2021   Tubular adenomas/Repeat 51yrs/[facility]  EGD   01/26/2021   +H.pylori gastritis.Treated/Repeat as needed/[facility]  foot surgery Right    LAPAROSCOPIC TUBAL LIGATION          Allergies Allergies Allergen Reactions  Amoxil [Amoxicillin] Rash  Penicillin Rash       Medications Ordered Prior to Encounter Current Outpatient Medications on File Prior to Visit Medication Sig Dispense Refill  busPIRone (BUSPAR) 15 MG tablet Take 15 mg by mouth 3 (three) times daily      cyclobenzaprine (FLEXERIL) 10 MG tablet nightly      FLUoxetine 60 mg Take by mouth once daily      gabapentin (NEURONTIN) 300 MG capsule Take 300 mg by mouth 4 (four) times daily      HYDROcodone-acetaminophen (NORCO) 10-325 mg tablet        hydrOXYzine (ATARAX) 25 MG tablet Take 1 tablet by mouth every 8 (eight) hours as needed      pantoprazole (PROTONIX) 40 MG DR tablet Take 1 tablet (40 mg total) by mouth once daily Take 15-20 mins before meal 30 tablet 11  promethazine (PHENERGAN) 25 MG tablet Take 25 mg by mouth every 6 (six) hours as needed      amitriptyline (ELAVIL) 25 MG tablet Take by mouth nightly (Patient not taking: Reported on 10/25/2021)      cetirizine (ZYRTEC) 10 MG tablet Take 1 tablet by mouth once daily (Patient not taking: Reported on 11/10/2022)      naloxone [facility]) 4 mg/actuation nasal spray as needed (Patient not taking: Reported on 11/10/2022)      nystatin (MYCOSTATIN) 100,000 unit/gram powder Apply topically 3 (three) times daily      oxybutynin (DITROPAN-XL) 10 MG XL tablet Take 1 tablet by mouth nightly (  Patient not taking: Reported on 11/10/2022)      ZTLIDO 1.8 % PtMd once daily (Patient not taking: Reported on 11/10/2022)        No current facility-administered medications on file prior to visit.      Family History Family History Problem Relation Age of Onset  High blood pressure (Hypertension) Mother    Kidney disease Father    Cervical cancer Sister    Uterine cancer Sister    Cervical cancer Maternal Grandmother    Lung cancer Maternal Grandfather    Brain cancer Maternal Grandfather    Thyroid cancer Daughter    Breast cancer Paternal Aunt        Tobacco Use History Social History    Tobacco Use Smoking Status Former  Current  packs/day: 0.00  Average packs/day: 0.3 packs/day for 20.0 years (5.0 ttl pk-yrs)  Types: Cigarettes  Start date: 09/01/1999  Quit date: 09/01/2019  Years since quitting: 3.1 Smokeless Tobacco Never      Social History Social History    Socioeconomic History  Marital status: Married Tobacco Use  Smoking status: Former     Current packs/day: 0.00     Average packs/day: 0.3 packs/day for 20.0 years (5.0 ttl pk-yrs)     Types: Cigarettes     Start date: 09/01/1999     Quit date: 09/01/2019     Years since quitting: 3.1  Smokeless tobacco: Never Substance and Sexual Activity  Alcohol use: Never  Drug use: Never    Social Determinants of Health    Food Insecurity: No Food Insecurity (10/06/2022)   [facility]   Hunger Vital Sign    Worried About Running Out of Food in the Last Year: Never true    Ran Out of Food in the Last Year: Never true Transportation Needs: No Transportation Needs (10/06/2022)   [facility] from [facility]    Lack of Transportation (Medical): No    Lack of Transportation (Non-Medical): No      Objective:   Vitals:   11/10/22 1045 11/10/22 1048 BP: 110/60   Pulse: (!) 119   Temp: 36.7 C (98 F)   Weight: 89.6 kg (197 lb 9.6 oz)   Height: 175.3 cm (5\' 9" )   PainSc:   0-No pain   Body mass index is 29.18 kg/m.   Physical Exam Vitals reviewed.  Constitutional:      General: She is not in acute distress.    Appearance: Normal appearance.  HENT:     Head: Normocephalic and atraumatic.  Eyes:     General: No scleral icterus.    Conjunctiva/sclera: Conjunctivae normal.  Cardiovascular:     Rate and Rhythm: Normal rate and regular rhythm.     Heart sounds: No murmur heard. Pulmonary:     Effort: Pulmonary effort is normal. No respiratory distress.     Breath sounds: Normal breath sounds. No wheezing.  Abdominal:     General: There is no distension.     Palpations: Abdomen is soft.     Comments: Mild RUQ  tenderness to palpation. No surgical scars in the upper abdomen.  Musculoskeletal:        General: Normal range of motion.  Skin:    General: Skin is warm and dry.     Coloration: Skin is not jaundiced.  Neurological:     General: No focal deficit present.     Mental Status: She is alert and oriented to person, place, and time.  Psychiatric:        Mood and Affect: Mood normal.        Behavior: Behavior normal.        Thought Content: Thought content normal.            Assessment and Plan:   Assessment Diagnoses and all orders for this visit:   Symptomatic cholelithiasis -     CCS Case Posting Request; Future     This is a 52 yo female referred with symptomatic cholelithiasis. I personally reviewed her imaging, labs and notes. She has cholelithiasis without evidence of acute cholecystitis or choledocholithiasis, and she has symptoms that are typical of biliary colic. Laparoscopic cholecystectomy was recommended. The details of this procedure were discussed with the patient, including the risks of bleeding, infection, bile leak, and <0.5% risk of common bile duct injury. The patient expressed understanding and agrees to proceed with surgery. She will be contacted to schedule an elective surgery date.  Sophronia Simas, MD [facility] General, Hepatobiliary and Pancreatic Surgery 11/10/22 11:08 AM

## 2022-11-10 NOTE — H&P (View-Only) (Signed)
History of Present Illness: Erin Dorsey is a 52 y.o. female who is seen today as an office consultation for evaluation of New Consultation (gallbladder)     She was seen in the ED on 6/2 with abdominal pain, nausea and vomiting. She had a RUQ [place] that showed cholelithiasis and mild intrahepatic bile duct dilation. She had an MRCP that showed the CBD was 7mm in diameter with no filling defects and no pancreatic head lesions. LFTs were normal. She was referred to discuss cholecystectomy. She says that she has been having intermittent abdominal pain for a while. It is in the epigastric area and RUQ, and worsens after eating, and she sometimes has nausea and vomiting. She has been trying to eat bland foods to help minimize symptoms.   Prior abdominal surgeries include a tubal ligation and bladder suspension.        Review of Systems: A complete review of systems was obtained from the patient.  I have reviewed this information and discussed as appropriate with the patient.  See HPI as well for other ROS.     Medical History: Past Medical History Past Medical History: Diagnosis Date  Anxiety    Cervical cancer (CMS/HHS-HCC) 2011  Chronic headache    Depression    Fall    GERD (gastroesophageal reflux disease)    Lumbar radiculopathy         Problem List There is no problem list on file for this patient.     Past Surgical History Past Surgical History: Procedure Laterality Date  PAROTIDECTOMY Right 10/15/2019  EXCISION TONGUE LESION   10/15/2019  LAPAROSCOPIC VAGINAL HYSTERECTOMY Bilateral 04/12/2020   salpingo oophorectomy, no malignancy identified  bladder suspension   04/12/2020  COLONOSCOPY   01/26/2021   Tubular adenomas/Repeat 51yrs/J[facility]  EGD   01/26/2021   +H.pylori gastritis.Treated/Repeat as needed/[facility]  foot surgery Right    LAPAROSCOPIC TUBAL LIGATION          Allergies Allergies Allergen Reactions  Amoxil [Amoxicillin] Rash  Penicillin Rash       Medications Ordered Prior to Encounter Current Outpatient Medications on File Prior to Visit Medication Sig Dispense Refill  busPIRone (BUSPAR) 15 MG tablet Take 15 mg by mouth 3 (three) times daily      cyclobenzaprine (FLEXERIL) 10 MG tablet nightly      FLUoxetine 60 mg Take by mouth once daily      gabapentin (NEURONTIN) 300 MG capsule Take 300 mg by mouth 4 (four) times daily      HYDROcodone-acetaminophen (NORCO) 10-325 mg tablet        hydrOXYzine (ATARAX) 25 MG tablet Take 1 tablet by mouth every 8 (eight) hours as needed      pantoprazole (PROTONIX) 40 MG DR tablet Take 1 tablet (40 mg total) by mouth once daily Take 15-20 mins before meal 30 tablet 11  promethazine (PHENERGAN) 25 MG tablet Take 25 mg by mouth every 6 (six) hours as needed      amitriptyline (ELAVIL) 25 MG tablet Take by mouth nightly (Patient not taking: Reported on 10/25/2021)      cetirizine (ZYRTEC) 10 MG tablet Take 1 tablet by mouth once daily (Patient not taking: Reported on 11/10/2022)      naloxone [facility]) 4 mg/actuation nasal spray as needed (Patient not taking: Reported on 11/10/2022)      nystatin (MYCOSTATIN) 100,000 unit/gram powder Apply topically 3 (three) times daily      oxybutynin (DITROPAN-XL) 10 MG XL tablet Take 1 tablet by mouth nightly (  Patient not taking: Reported on 11/10/2022)      ZTLIDO 1.8 % PtMd once daily (Patient not taking: Reported on 11/10/2022)        No current facility-administered medications on file prior to visit.      Family History Family History Problem Relation Age of Onset  High blood pressure (Hypertension) Mother    Kidney disease Father    Cervical cancer Sister    Uterine cancer Sister    Cervical cancer Maternal Grandmother    Lung cancer Maternal Grandfather    Brain cancer Maternal Grandfather    Thyroid cancer Daughter    Breast cancer Paternal Aunt        Tobacco Use History Social History    Tobacco Use Smoking Status Former  Current  packs/day: 0.00  Average packs/day: 0.3 packs/day for 20.0 years (5.0 ttl pk-yrs)  Types: Cigarettes  Start date: 09/01/1999  Quit date: 09/01/2019  Years since quitting: 3.1 Smokeless Tobacco Never      Social History Social History    Socioeconomic History  Marital status: Married Tobacco Use  Smoking status: Former     Current packs/day: 0.00     Average packs/day: 0.3 packs/day for 20.0 years (5.0 ttl pk-yrs)     Types: Cigarettes     Start date: 09/01/1999     Quit date: 09/01/2019     Years since quitting: 3.1  Smokeless tobacco: Never Substance and Sexual Activity  Alcohol use: Never  Drug use: Never    Social Determinants of Health    Food Insecurity: No Food Insecurity (10/06/2022)   [facility]   Hunger Vital Sign    Worried About Running Out of Food in the Last Year: Never true    Ran Out of Food in the Last Year: Never true Transportation Needs: No Transportation Needs (10/06/2022)   Received from [facility] - Transportation    Lack of Transportation (Medical): No    Lack of Transportation (Non-Medical): No      Objective:   Vitals:   11/10/22 1045 11/10/22 1048 BP: 110/60   Pulse: (!) 119   Temp: 36.7 C (98 F)   Weight: 89.6 kg (197 lb 9.6 oz)   Height: 175.3 cm (5\' 9" )   PainSc:   0-No pain   Body mass index is 29.18 kg/m.   Physical Exam Vitals reviewed.  Constitutional:      General: She is not in acute distress.    Appearance: Normal appearance.  HENT:     Head: Normocephalic and atraumatic.  Eyes:     General: No scleral icterus.    Conjunctiva/sclera: Conjunctivae normal.  Cardiovascular:     Rate and Rhythm: Normal rate and regular rhythm.     Heart sounds: No murmur heard. Pulmonary:     Effort: Pulmonary effort is normal. No respiratory distress.     Breath sounds: Normal breath sounds. No wheezing.  Abdominal:     General: There is no distension.     Palpations: Abdomen is soft.     Comments: Mild RUQ  tenderness to palpation. No surgical scars in the upper abdomen.  Musculoskeletal:        General: Normal range of motion.  Skin:    General: Skin is warm and dry.     Coloration: Skin is not jaundiced.  Neurological:     General: No focal deficit present.     Mental Status: She is alert and oriented to person, place, and time.  Psychiatric:        Mood and Affect: Mood normal.        Behavior: Behavior normal.        Thought Content: Thought content normal.            Assessment and Plan:   Assessment Diagnoses and all orders for this visit:   Symptomatic cholelithiasis -     CCS Case Posting Request; Future     This is a 52 yo female referred with symptomatic cholelithiasis. I personally reviewed her imaging, labs and notes. She has cholelithiasis without evidence of acute cholecystitis or choledocholithiasis, and she has symptoms that are typical of biliary colic. Laparoscopic cholecystectomy was recommended. The details of this procedure were discussed with the patient, including the risks of bleeding, infection, bile leak, and <0.5% risk of common bile duct injury. The patient expressed understanding and agrees to proceed with surgery. She will be contacted to schedule an elective surgery date.  Sophronia Simas, MD [facility], Hepatobiliary and Pancreatic Surgery 11/10/22 11:08 AM

## 2022-11-14 ENCOUNTER — Ambulatory Visit: Payer: Medicaid Other

## 2022-11-14 DIAGNOSIS — E538 Deficiency of other specified B group vitamins: Secondary | ICD-10-CM

## 2022-11-14 MED ORDER — CYANOCOBALAMIN 1000 MCG/ML IJ SOLN
1000.0000 ug | Freq: Once | INTRAMUSCULAR | Status: AC
Start: 2022-11-14 — End: 2022-11-14
  Administered 2022-11-14: 1000 ug via INTRAMUSCULAR

## 2022-11-14 NOTE — Progress Notes (Signed)
After obtaining consent, and per orders of  Mort Sawyers, NP, injection of B12 given IM in right deltoid by Valentino Nose. Patient tolerated injection well

## 2022-11-22 NOTE — Progress Notes (Signed)
Surgical Instructions   Your procedure is scheduled on Thurs., Aug. 1, 2024 from 7:30AM-9:00AM.  Report to [facility] Main Entrance "A" at 5:30 A.M., then check in with the Admitting office.  Call this number if you have problems the morning of surgery:  [phone]  If you experience any cold or flu symptoms such as cough, fever, chills, shortness of breath, etc. between now and your scheduled surgery, please notify us at the above number.   Remember:  Do not eat after midnight the night before your surgery  You may drink clear liquids until 3 hours (4:30AM) prior to surgery time, the morning of your surgery.   Clear liquids allowed are: Water, Non-Citrus Juices (without pulp), Carbonated Beverages, Clear Tea, Black Coffee ONLY (NO MILK, CREAM OR POWDERED CREAMER of any kind), and Gatorade   Take these medicines the morning of surgery with A SIP OF WATER:  BusPIRone (BUSPAR)  FLUoxetine HCl  Gabapentin (NEURONTIN)  Omeprazole (PRILOSEC)  ValACYclovir (VALTREX) SUBOXONE   If Needed: Albuterol (VENTOLIN HFA) Please bring inhaler with you to the hospital HYDROcodone-acetaminophen (NORCO)  Ondansetron (ZOFRAN)   As of today, STOP taking any Aspirin (unless otherwise instructed by your surgeon) Aleve, Naproxen, Ibuprofen, Motrin, Meloxicam (MOBIC), Advil, Goody's, BC's, all herbal medications, fish oil, and all vitamins.             Day of Surgery: Do not wear jewelry or makeup. Do not wear lotions, powders, perfumes, or deodorant. Do not shave 48 hours prior to surgery.  Do not bring valuables to the hospital. DO Not wear nail polish, gel polish, artificial nails, or any other type of covering on natural nails (fingers and toes) If you have artificial nails or gel coating that need to be removed by a nail salon, please have this removed prior to surgery. Artificial nails or gel coating may interfere with anesthesia's ability to adequately monitor your vital signs.              [facility] is not responsible for any belongings or valuables.  Do NOT Smoke (Tobacco/Vaping)  24 hours prior to your procedure  If you use a CPAP at night, you may bring your mask and machine for your overnight stay.   Contacts, glasses, hearing aids, dentures or partials may not be worn into surgery, please bring cases for these belongings   For patients admitted to the hospital, discharge time will be determined by your treatment team.   Patients discharged the day of surgery will not be allowed to drive home, and someone needs to stay with them for 24 hours.  Special instructions:    Oral Hygiene is also important to reduce your risk of infection.  Remember - BRUSH YOUR TEETH THE MORNING OF SURGERY WITH YOUR REGULAR TOOTHPASTE  [facility]- Preparing For Surgery  Before surgery, you can play an important role. Because skin is not sterile, your skin needs to be as free of germs as possible. You can reduce the number of germs on your skin by washing with CHG (chlorahexidine gluconate) Soap before surgery.  CHG is an antiseptic cleaner which kills germs and bonds with the skin to continue killing germs even after washing.    Please do not use if you have an allergy to CHG or antibacterial soaps. If your skin becomes reddened/irritated stop using the CHG.  Do not shave (including legs and underarms) for at least 48 hours prior to first CHG shower. It is OK to shave your face.  Please follow these instructions carefully.    Shower the NIGHT BEFORE SURGERY and the MORNING OF SURGERY with CHG Soap.   If you chose to wash your hair, wash your hair first as usual with your normal shampoo. After you shampoo, rinse your hair and body thoroughly to remove the shampoo.  Then [employer] and genitals (private parts) with your normal soap and rinse thoroughly to remove soap.  After that Use CHG Soap as you would any other liquid soap. You can apply CHG directly to the skin and wash gently with a  scrungie or a clean washcloth.   Apply the CHG Soap to your body ONLY FROM THE NECK DOWN.  Do not use on open wounds or open sores. Avoid contact with your eyes, ears, mouth and genitals (private parts). Wash Face and genitals (private parts)  with your normal soap.   Wash thoroughly, paying special attention to the area where your surgery will be performed.  Thoroughly rinse your body with warm water from the neck down.  DO NOT shower/wash with your normal soap after using and rinsing off the CHG Soap.  Pat yourself dry with a CLEAN TOWEL.  Wear CLEAN PAJAMAS to bed the night before surgery  Place CLEAN SHEETS on your bed the night before your surgery  DO NOT SLEEP WITH PETS.  Reminder: Take a shower with CHG soap. Wear Clean/Comfortable clothing the morning of surgery Do not apply any deodorants/lotions.   Remember to brush your teeth WITH YOUR REGULAR TOOTHPASTE.  SURGICAL WAITING ROOM VISITATION Patients having surgery or a procedure may have no more than 2 support people in the waiting area - these visitors may rotate.   Children under the age of 76 must have an adult with them who is not the patient. If the patient needs to stay at the hospital during part of their recovery, the visitor guidelines for inpatient rooms apply. Pre-op nurse will coordinate an appropriate time for 1 support person to accompany patient in pre-op.  This support person may not rotate.   Please refer to the [facility] website for the visitor guidelines for Inpatients (after your surgery is over and you are in a regular room).   Please read over the following fact sheets that you were given.

## 2022-11-22 NOTE — Progress Notes (Signed)
Surgical Instructions   Your procedure is scheduled on Thursday November 30, 2022. Report to [facility] Main Entrance "A" at 5:30 A.M., then check in with the Admitting office. Any questions or running late day of surgery: call [phone]  Questions prior to your surgery date: call [phone], Monday-Friday, 8am-4pm. If you experience any cold or flu symptoms such as cough, fever, chills, shortness of breath, etc. between now and your scheduled surgery, please notify us at the above number.     Remember:  Do not eat after midnight the night before your surgery   You may drink clear liquids until 4:30 the morning of your surgery.   Clear liquids allowed are: Water, Non-Citrus Juices (without pulp), Carbonated Beverages, Clear Tea, Black Coffee Only (NO MILK, CREAM OR POWDERED CREAMER of any kind), and Gatorade.    Take these medicines the morning of surgery with A SIP OF WATER  busPIRone (BUSPAR)  FLUoxetine HCl  gabapentin (NEURONTIN)  omeprazole (PRILOSEC)  valACYclovir (VALTREX)    May take these medicines IF NEEDED: albuterol (VENTOLIN HFA) Please bring inhaler with you to the hospital HYDROcodone-acetaminophen (NORCO)  ondansetron (ZOFRAN)  traMADol (ULTRAM)    One week prior to surgery, STOP taking any Aspirin (unless otherwise instructed by your surgeon) Aleve, Naproxen, Ibuprofen, Motrin, Advil, Goody's, BC's, all herbal medications, fish oil, and non-prescription vitamins.                     Do NOT Smoke (Tobacco/Vaping) for 24 hours prior to your procedure.  If you use a CPAP at night, you may bring your mask/headgear for your overnight stay.   You will be asked to remove any contacts, glasses, piercing's, hearing aid's, dentures/partials prior to surgery. Please bring cases for these items if needed.    Patients discharged the day of surgery will not be allowed to drive home, and someone needs to stay with them for 24 hours.  SURGICAL WAITING ROOM  VISITATION Patients may have no more than 2 support people in the waiting area - these visitors may rotate.   Pre-op nurse will coordinate an appropriate time for 1 ADULT support person, who may not rotate, to accompany patient in pre-op.  Children under the age of 7 must have an adult with them who is not the patient and must remain in the main waiting area with an adult.  If the patient needs to stay at the hospital during part of their recovery, the visitor guidelines for inpatient rooms apply.  Please refer to the [facility] website for the visitor guidelines for any additional information.   If you received a COVID test during your pre-op visit  it is requested that you wear a mask when out in public, stay away from anyone that may not be feeling well and notify your surgeon if you develop symptoms. If you have been in contact with anyone that has tested positive in the last 10 days please notify you surgeon.      Pre-operative CHG Bathing Instructions   You can play a key role in reducing the risk of infection after surgery. Your skin needs to be as free of germs as possible. You can reduce the number of germs on your skin by washing with CHG (chlorhexidine gluconate) soap before surgery. CHG is an antiseptic soap that kills germs and continues to kill germs even after washing.   DO NOT use if you have an allergy to chlorhexidine/CHG or antibacterial soaps. If your skin becomes reddened  or irritated, stop using the CHG and notify one of our RNs at [phone].              TAKE A SHOWER THE NIGHT BEFORE SURGERY AND THE DAY OF SURGERY    Please keep in mind the following:  DO NOT shave, including legs and underarms, 48 hours prior to surgery.   You may shave your face before/day of surgery.  Place clean sheets on your bed the night before surgery Use a clean washcloth (not used since being washed) for each shower. DO NOT sleep with pet's night before surgery.  CHG Shower  Instructions:  If you choose to wash your hair and private area, wash first with your normal shampoo/soap.  After you use shampoo/soap, rinse your hair and body thoroughly to remove shampoo/soap residue.  Turn the water OFF and apply half the bottle of CHG soap to a CLEAN washcloth.  Apply CHG soap ONLY FROM YOUR NECK DOWN TO YOUR TOES (washing for 3-5 minutes)  DO NOT use CHG soap on face, private areas, open wounds, or sores.  Pay special attention to the area where your surgery is being performed.  If you are having back surgery, having someone wash your back for you may be helpful. Wait 2 minutes after CHG soap is applied, then you may rinse off the CHG soap.  Pat dry with a clean towel  Put on clean pajamas    Additional instructions for the day of surgery: DO NOT APPLY any lotions, deodorants, cologne, or perfumes.   Do not wear jewelry or makeup Do not wear nail polish, gel polish, artificial nails, or any other type of covering on natural nails (fingers and toes) Do not bring valuables to the hospital. [facility] is not responsible for valuables/personal belongings. Put on clean/comfortable clothes.  Please brush your teeth.  Ask your nurse before applying any prescription medications to the skin.

## 2022-11-23 ENCOUNTER — Encounter (HOSPITAL_COMMUNITY): Payer: Self-pay

## 2022-11-23 ENCOUNTER — Other Ambulatory Visit: Payer: Self-pay

## 2022-11-23 ENCOUNTER — Encounter (HOSPITAL_COMMUNITY)
Admission: RE | Admit: 2022-11-23 | Discharge: 2022-11-23 | Disposition: A | Discharge status: Home / Self Care | Payer: Medicaid Other | Source: Ambulatory Visit | Attending: Surgery | Admitting: Surgery

## 2022-11-23 VITALS — BP 123/70 | HR 74 | Temp 98.3°F | Resp 18 | Ht 69.0 in | Wt 202.2 lb

## 2022-11-23 DIAGNOSIS — Z01812 Encounter for preprocedural laboratory examination: Secondary | ICD-10-CM | POA: Insufficient documentation

## 2022-11-23 DIAGNOSIS — Z01818 Encounter for other preprocedural examination: Secondary | ICD-10-CM

## 2022-11-23 HISTORY — DX: Fatty (change of) liver, not elsewhere classified: K76.0

## 2022-11-23 LAB — COMPREHENSIVE METABOLIC PANEL
ALT: 18 U/L (ref 0–44)
AST: 22 U/L (ref 15–41)
Albumin: 3.6 g/dL (ref 3.5–5.0)
Alkaline Phosphatase: 40 U/L (ref 38–126)
Anion gap: 8 (ref 5–15)
BUN: 8 mg/dL (ref 6–20)
CO2: 27 mmol/L (ref 22–32)
Calcium: 8.9 mg/dL (ref 8.9–10.3)
Chloride: 103 mmol/L (ref 98–111)
Creatinine, Ser: 0.73 mg/dL (ref 0.44–1.00)
GFR, Estimated: >60 mL/min (ref >60)
Glucose, Bld: 72 mg/dL (ref 70–99)
Potassium: 3.4 mmol/L — ABNORMAL LOW (ref 3.5–5.1)
Sodium: 138 mmol/L (ref 135–145)
Total Bilirubin: 0.8 mg/dL (ref 0.3–1.2)
Total Protein: 6.9 g/dL (ref 6.5–8.1)

## 2022-11-23 LAB — CBC
HCT: 40.7 % (ref 36.0–46.0)
Hemoglobin: 13.1 g/dL (ref 12.0–15.0)
MCH: 33.5 pg (ref 26.0–34.0)
MCHC: 32.2 g/dL (ref 30.0–36.0)
MCV: 104.1 fL — ABNORMAL HIGH (ref 80.0–100.0)
Platelets: 191 10*3/uL (ref 150–400)
RBC: 3.91 MIL/uL (ref 3.87–5.11)
RDW: 12.7 % (ref 11.5–15.5)
WBC: 4.7 10*3/uL (ref 4.0–10.5)
nRBC: 0 % (ref 0.0–0.2)

## 2022-11-23 NOTE — Progress Notes (Signed)
PCP - Dr. Pearletha Alfred  Cardiologist - Denies  EP- Denies  Endocrine- Denies  Pulm- Denies  Chest x-ray - Denies  EKG - Denies  Stress Test - Denies  ECHO - Denies  Cardiac Cath - Denies  AICD-na PM-na LOOP-na  Nerve Stimulator- Denies  Dialysis- Denies  Sleep Study - Denies CPAP - Denies  LABS- 11/23/22: CBC, CMP  ASA- Denies  ERAS- Yes, no drink  HA1C- Denies GLP-1-na  Anesthesia- No  Pt denies having chest pain, sob, or fever at this time. All instructions explained to the pt, with a verbal understanding of the material. Pt agrees to go over the instructions while at home for a better understanding. The opportunity to ask questions was provided.

## 2022-11-24 ENCOUNTER — Ambulatory Visit: Payer: Medicaid Other | Admitting: Family

## 2022-11-28 ENCOUNTER — Ambulatory Visit: Payer: Medicaid Other | Admitting: Family

## 2022-11-28 ENCOUNTER — Encounter: Payer: Self-pay | Admitting: Family

## 2022-11-28 ENCOUNTER — Encounter: Payer: Self-pay | Admitting: *Deleted

## 2022-11-28 VITALS — BP 126/78 | HR 93 | Temp 98.6°F | Ht 69.0 in | Wt 197.0 lb

## 2022-11-28 DIAGNOSIS — E876 Hypokalemia: Secondary | ICD-10-CM | POA: Insufficient documentation

## 2022-11-28 DIAGNOSIS — K130 Diseases of lips: Secondary | ICD-10-CM | POA: Diagnosis not present

## 2022-11-28 DIAGNOSIS — J4520 Mild intermittent asthma, uncomplicated: Secondary | ICD-10-CM | POA: Diagnosis not present

## 2022-11-28 DIAGNOSIS — K2100 Gastro-esophageal reflux disease with esophagitis, without bleeding: Secondary | ICD-10-CM

## 2022-11-28 DIAGNOSIS — J40 Bronchitis, not specified as acute or chronic: Secondary | ICD-10-CM

## 2022-11-28 DIAGNOSIS — L304 Erythema intertrigo: Secondary | ICD-10-CM

## 2022-11-28 DIAGNOSIS — F1291 Cannabis use, unspecified, in remission: Secondary | ICD-10-CM

## 2022-11-28 DIAGNOSIS — Z8659 Personal history of other mental and behavioral disorders: Secondary | ICD-10-CM

## 2022-11-28 DIAGNOSIS — E538 Deficiency of other specified B group vitamins: Secondary | ICD-10-CM

## 2022-11-28 LAB — BASIC METABOLIC PANEL
BUN: 9 mg/dL (ref 6–23)
CO2: 32 mEq/L (ref 19–32)
Calcium: 9.8 mg/dL (ref 8.4–10.5)
Chloride: 102 mEq/L (ref 96–112)
Creatinine, Ser: 0.78 mg/dL (ref 0.40–1.20)
GFR: 88.6 mL/min (ref >60.00)
Glucose, Bld: 82 mg/dL (ref 70–99)
Potassium: 4 mEq/L (ref 3.5–5.1)
Sodium: 140 mEq/L (ref 135–145)

## 2022-11-28 MED ORDER — ALBUTEROL SULFATE HFA 108 (90 BASE) MCG/ACT IN AERS
2.0000 | INHALATION_SPRAY | Freq: Four times a day (QID) | RESPIRATORY_TRACT | 2 refills | Status: DC | PRN
Start: 2022-11-28 — End: 2023-03-05

## 2022-11-28 MED ORDER — FLUTICASONE PROPIONATE HFA 110 MCG/ACT IN AERO
1.0000 | INHALATION_SPRAY | Freq: Two times a day (BID) | RESPIRATORY_TRACT | 12 refills | Status: AC
Start: 2022-11-28 — End: ?

## 2022-11-28 NOTE — Assessment & Plan Note (Signed)
Not controlled, start flovent 110 mcg twice daily  Hopefully will need less albuterol use

## 2022-11-28 NOTE — Assessment & Plan Note (Signed)
Continue omeprazole , likely will talk about cessation post cholecystectomy

## 2022-11-28 NOTE — Assessment & Plan Note (Signed)
Pt given list of recommendations to call.  Looking into referral as pt was never called.

## 2022-11-28 NOTE — Assessment & Plan Note (Signed)
Improving slightly.  Completed b12 injections, advised to start daily otc 1000 mcg b12

## 2022-11-28 NOTE — Patient Instructions (Addendum)
  Recommendation to start over the counter 1000 mcg once daily.   Let's also start flovent inhaler twice daily. Hopefully this will result in less need for albuterol.  Make sure you wash your mouth out after every use.   ------------------------------------  [facility] Phone # [phone] They have locations in [place], [place], [place], and [place]. They do take Healthy blue I know for sure and the [employer].   [place] [phone]  [address] [place], [place], [place] [address]    [facility] [employer] [address] [place], [place] [address]   [phone]   If at any time you feel your needs are more urgent or you are concerned for your well being please see the below options:  For walk in options for mental health:   [facility] [address] in [place], [place]. Call our 24-hour HelpLine at ([phone] or [phone] for immediate assistance for mental health and substance abuse issues.  And or walk into [facility] hospital ER.   [employer]: 1-800-SUICIDE  The [employer] Suicide Prevention Lifeline: 1-800-273-TALK  Regards,   Mort Sawyers FNP-C

## 2022-11-28 NOTE — Progress Notes (Signed)
Established Patient Office Visit  Subjective:      CC:  Chief Complaint  Patient presents with   Medical Management of Chronic Issues    Patient thinks she is following up on B-12    HPI: Erin Dorsey is a 52 y.o. female presenting on 11/28/2022 for Medical Management of Chronic Issues (Patient thinks she is following up on B-12) . Chelitis:   B12 def: has completed b12 injections once monthly  Lab Results  Component Value Date   VITAMINB12 186 (L) 08/29/2022    GERD: on omeprazole 20 mg once daily.   Intertrigo: last time recommendation for lotrisone cream. She states has been working and improving since last visit.   Recurrent MDD and anxiety: referral placed last visit to psychiatry. Was given referral for psychiatry last visit and did not receive any information. She has not reached out to any medicaid psychiatrists. Requesting this information prior to leaving today.   Scheduled for cholecystectomy, she is having ongoing ruq abdominal pain. She is eager to get the gallbladder out. Surgery will be with Dr. Freida Busman.   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   Asthma: albuterol about twice a week. Wakes up at night for sob maybe 1-2 times a week. Used to smoke cigarettes. Quit 52 y/o ago she states was not regular everyday smoking only when she was nervous.   Hypokalemia: was on potassium 20 meq bid x 7 days.  Social history:  Relevant past medical, surgical, family and social history reviewed and updated as indicated. Interim medical history since our last visit reviewed.  Allergies and medications reviewed and updated.  DATA REVIEWED: CHART IN EPIC     ROS: Negative unless specifically indicated above in HPI.    Current Outpatient Medications:    amitriptyline (ELAVIL) 25 MG tablet, Take 1 tablet (25 mg total) by mouth at bedtime., Disp: 90 tablet, Rfl: 0   busPIRone (BUSPAR) 15 MG tablet, Take 1 tablet (15 mg total) by mouth 3 (three) times  daily. Will need to be seen in office for more refills., Disp: 270 tablet, Rfl: 3   clotrimazole-betamethasone (LOTRISONE) cream, APPLY 1 APPLICATION TOPICALLY DAILY, Disp: 15 g, Rfl: 1   cyclobenzaprine (FLEXERIL) 10 MG tablet, Take 1/2 to 1 tablet by mouth at bedtime., Disp: 90 tablet, Rfl: 0   FLUoxetine HCl 60 MG TABS, Take 60 mg by mouth daily., Disp: 90 tablet, Rfl: 3   fluticasone (FLOVENT HFA) 110 MCG/ACT inhaler, Inhale 1 puff into the lungs in the morning and at bedtime., Disp: 1 each, Rfl: 12   gabapentin (NEURONTIN) 300 MG capsule, Take 1 capsule (300 mg total) by mouth 3 (three) times daily. 1 tablet in AM, 1 tablet at noon, then 3 tablets nightly, Disp: 150 capsule, Rfl: 5   HYDROcodone-acetaminophen (NORCO) 10-325 MG tablet, Take 1 tablet by mouth 3 (three) times daily as needed., Disp: , Rfl:    Hydrocortisone Acetate 1 % CREA, Apply 1 application  topically daily as needed (heat rash)., Disp: 15 g, Rfl: 0   hydrOXYzine (ATARAX) 25 MG tablet, Take 1 tablet (25 mg total) by mouth at bedtime., Disp: 90 tablet, Rfl: 1   Lidocaine (ZTLIDO) 1.8 % PTCH, Apply topically daily., Disp: , Rfl:    meloxicam (MOBIC) 15 MG tablet, Take 15 mg by mouth daily as needed for pain., Disp: , Rfl:    mupirocin ointment (BACTROBAN) 2 %, APPLY TO AFFECTED AREA TWICE A DAY, Disp: 22 g, Rfl: 0   naloxone (  NARCAN) nasal spray 4 mg/0.1 mL, Place 1 spray into the nose as needed., Disp: , Rfl:    nystatin (MYCOSTATIN/NYSTOP) powder, APPLY TO AFFECTED AREA 3 TIMES A DAY, Disp: 15 g, Rfl: 5   omeprazole (PRILOSEC) 20 MG capsule, TAKE 1 CAPSULE BY MOUTH EVERY DAY, Disp: 90 capsule, Rfl: 1   ondansetron (ZOFRAN) 4 MG tablet, TAKE 1 TABLET BY MOUTH EVERY 8 HOURS AS NEEDED FOR NAUSEA AND VOMITING, Disp: 20 tablet, Rfl: 0   oxybutynin (DITROPAN-XL) 10 MG 24 hr tablet, TAKE 1 TABLET BY MOUTH EVERYDAY AT BEDTIME, Disp: 90 tablet, Rfl: 0   potassium chloride SA (KLOR-CON M) 20 MEQ tablet, Take 1 tablet (20 mEq total) by  mouth 2 (two) times daily for 7 days., Disp: 14 tablet, Rfl: 0   pravastatin (PRAVACHOL) 10 MG tablet, Take 10 mg by mouth at bedtime., Disp: , Rfl:    promethazine (PHENERGAN) 25 MG tablet, Take 25 mg by mouth every 6 (six) hours as needed for nausea or vomiting., Disp: , Rfl:    SUBOXONE 8-2 MG FILM, Take 1 Film by mouth 3 (three) times daily., Disp: , Rfl:    valACYclovir (VALTREX) 500 MG tablet, TAKE 2 TABLETS BY MOUTH EVERY DAY, Disp: 60 tablet, Rfl: 2   albuterol (VENTOLIN HFA) 108 (90 Base) MCG/ACT inhaler, Inhale 2 puffs into the lungs every 6 (six) hours as needed for wheezing or shortness of breath., Disp: 8 g, Rfl: 2      Objective:    BP 126/78   Pulse 93   Temp 98.6 F (37 C) (Temporal)   Ht 5\' 9"  (1.753 m)   Wt 197 lb (89.4 kg)   LMP 06/23/2015   SpO2 98%   BMI 29.09 kg/m   Wt Readings from Last 3 Encounters:  11/28/22 197 lb (89.4 kg)  11/23/22 202 lb 3.4 oz (91.7 kg)  10/01/22 220 lb (99.8 kg)    Physical Exam Constitutional:      General: She is not in acute distress.    Appearance: Normal appearance. She is obese. She is not ill-appearing, toxic-appearing or diaphoretic.  HENT:     Head: Normocephalic.  Cardiovascular:     Rate and Rhythm: Normal rate and regular rhythm.  Pulmonary:     Effort: Pulmonary effort is normal.  Musculoskeletal:        General: Normal range of motion.  Neurological:     General: No focal deficit present.     Mental Status: She is alert and oriented to person, place, and time. Mental status is at baseline.  Psychiatric:        Mood and Affect: Mood normal.        Behavior: Behavior normal.        Thought Content: Thought content normal.        Judgment: Judgment normal.           Assessment & Plan:  History of marijuana use  Bronchitis -     Albuterol Sulfate HFA; Inhale 2 puffs into the lungs every 6 (six) hours as needed for wheezing or shortness of breath.  Dispense: 8 g; Refill: 2  Mild intermittent asthma  without complication Assessment & Plan: Not controlled, start flovent 110 mcg twice daily  Hopefully will need less albuterol use  Orders: -     Fluticasone Propionate HFA; Inhale 1 puff into the lungs in the morning and at bedtime.  Dispense: 1 each; Refill: 12  Hypokalemia -     Basic metabolic  panel  Cheilitis Assessment & Plan:  Improving slightly.  Completed b12 injections, advised to start daily otc 1000 mcg b12   Gastroesophageal reflux disease with esophagitis without hemorrhage Assessment & Plan: Continue omeprazole , likely will talk about cessation post cholecystectomy    Vitamin B12 deficiency  History of bipolar disorder Assessment & Plan: Pt given list of recommendations to call.  Looking into referral as pt was never called.    Intertrigo Assessment & Plan: Improving. Cont lotrisone prn      Return in about 6 months (around 05/31/2023) for f/u CPE.  Mort Sawyers, MSN, APRN, FNP-C [facility]

## 2022-11-28 NOTE — Assessment & Plan Note (Signed)
Improving. Cont lotrisone prn

## 2022-11-29 ENCOUNTER — Telehealth: Payer: Self-pay

## 2022-11-29 NOTE — Telephone Encounter (Signed)
-----   Message from [place] sent at 11/28/2022  3:03 PM EDT ----- Thanks ashtyn. [place] pool can you call pt with this information below? She doesn't check mychart even though she is signed up for it. Thanks!   Her psychiatry referral has been sent to [facility] She should be able to call directly to get scheduled.   [facility] [address] [place],  [place]  [address] Main: [phone]   If there are any issues with this please let us know. ----- Message ----- From: Maisie Fus, CMA Sent: 11/28/2022   3:00 PM EDT To: Mort Sawyers, FNP  I have reached out to the patient with contact information for the referral. ----- Message ----- From: Mort Sawyers, FNP Sent: 11/28/2022   7:53 AM EDT To: Maisie Fus, CMA  Can you please check out her psychiatry referral? She has still not heard anything. Is there a place that was recommended that she can call to get scheduled?

## 2022-11-29 NOTE — Telephone Encounter (Signed)
Called Patient and was talking but her phone service was going in & out then the phone hung up. Will try her back in a little bit.

## 2022-11-30 ENCOUNTER — Other Ambulatory Visit: Payer: Self-pay

## 2022-11-30 ENCOUNTER — Encounter (HOSPITAL_COMMUNITY): Payer: Self-pay | Admitting: Surgery

## 2022-11-30 ENCOUNTER — Ambulatory Visit (HOSPITAL_BASED_OUTPATIENT_CLINIC_OR_DEPARTMENT_OTHER): Payer: Medicaid Other | Admitting: Anesthesiology

## 2022-11-30 ENCOUNTER — Encounter (HOSPITAL_COMMUNITY)
Admission: RE | Disposition: A | Discharge status: Home / Self Care | Payer: Self-pay | Source: Home / Self Care | Attending: Surgery

## 2022-11-30 ENCOUNTER — Ambulatory Visit (HOSPITAL_COMMUNITY)
Admission: RE | Admit: 2022-11-30 | Discharge: 2022-11-30 | Disposition: A | Discharge status: Home / Self Care | Payer: Medicaid Other | Attending: Surgery | Admitting: Surgery

## 2022-11-30 ENCOUNTER — Ambulatory Visit (HOSPITAL_COMMUNITY): Payer: Medicaid Other | Admitting: Anesthesiology

## 2022-11-30 DIAGNOSIS — K8010 Calculus of gallbladder with chronic cholecystitis without obstruction: Secondary | ICD-10-CM | POA: Insufficient documentation

## 2022-11-30 DIAGNOSIS — Z8249 Family history of ischemic heart disease and other diseases of the circulatory system: Secondary | ICD-10-CM | POA: Insufficient documentation

## 2022-11-30 DIAGNOSIS — G709 Myoneural disorder, unspecified: Secondary | ICD-10-CM | POA: Diagnosis not present

## 2022-11-30 DIAGNOSIS — F418 Other specified anxiety disorders: Secondary | ICD-10-CM | POA: Diagnosis not present

## 2022-11-30 DIAGNOSIS — K8020 Calculus of gallbladder without cholecystitis without obstruction: Secondary | ICD-10-CM | POA: Diagnosis not present

## 2022-11-30 DIAGNOSIS — I10 Essential (primary) hypertension: Secondary | ICD-10-CM | POA: Insufficient documentation

## 2022-11-30 DIAGNOSIS — Z79899 Other long term (current) drug therapy: Secondary | ICD-10-CM | POA: Diagnosis not present

## 2022-11-30 DIAGNOSIS — J45909 Unspecified asthma, uncomplicated: Secondary | ICD-10-CM | POA: Insufficient documentation

## 2022-11-30 DIAGNOSIS — K219 Gastro-esophageal reflux disease without esophagitis: Secondary | ICD-10-CM | POA: Diagnosis not present

## 2022-11-30 DIAGNOSIS — Z87891 Personal history of nicotine dependence: Secondary | ICD-10-CM | POA: Insufficient documentation

## 2022-11-30 HISTORY — PX: CHOLECYSTECTOMY: SHX55

## 2022-11-30 SURGERY — LAPAROSCOPIC CHOLECYSTECTOMY
Anesthesia: General | Site: Abdomen

## 2022-11-30 MED ORDER — SUGAMMADEX SODIUM 200 MG/2ML IV SOLN
INTRAVENOUS | Status: DC | PRN
  Administered 2022-11-30: 200 mg via INTRAVENOUS

## 2022-11-30 MED ORDER — HYDROMORPHONE HCL 1 MG/ML IJ SOLN
INTRAMUSCULAR | Status: DC | PRN
Start: 2022-11-30 — End: 2022-11-30
  Administered 2022-11-30: .5 mg via INTRAVENOUS

## 2022-11-30 MED ORDER — LIDOCAINE 2% (20 MG/ML) 5 ML SYRINGE
INTRAMUSCULAR | Status: AC
  Filled 2022-11-30: qty 5

## 2022-11-30 MED ORDER — CELECOXIB 200 MG PO CAPS
200.0000 mg | ORAL_CAPSULE | ORAL | Status: AC
  Administered 2022-11-30: 200 mg via ORAL
  Filled 2022-11-30: qty 1

## 2022-11-30 MED ORDER — ACETAMINOPHEN 160 MG/5ML PO SOLN: 325.0000 mg | ORAL | Status: DC | PRN

## 2022-11-30 MED ORDER — FENTANYL CITRATE (PF) 100 MCG/2ML IJ SOLN
INTRAMUSCULAR | Status: AC
  Filled 2022-11-30: qty 2

## 2022-11-30 MED ORDER — ACETAMINOPHEN 10 MG/ML IV SOLN: 1000.0000 mg | Freq: Once | INTRAVENOUS | Status: DC | PRN

## 2022-11-30 MED ORDER — PROPOFOL 10 MG/ML IV BOLUS
INTRAVENOUS | Status: DC | PRN
  Administered 2022-11-30: 200 mg via INTRAVENOUS

## 2022-11-30 MED ORDER — PROPOFOL 10 MG/ML IV BOLUS
INTRAVENOUS | Status: AC
  Filled 2022-11-30: qty 20

## 2022-11-30 MED ORDER — PROMETHAZINE HCL 25 MG/ML IJ SOLN: 6.2500 mg | INTRAMUSCULAR | Status: DC | PRN

## 2022-11-30 MED ORDER — ORAL CARE MOUTH RINSE: 15.0000 mL | Freq: Once | OROMUCOSAL | Status: AC

## 2022-11-30 MED ORDER — ROCURONIUM BROMIDE 10 MG/ML (PF) SYRINGE
PREFILLED_SYRINGE | INTRAVENOUS | Status: DC | PRN
  Administered 2022-11-30: 60 mg via INTRAVENOUS

## 2022-11-30 MED ORDER — ACETAMINOPHEN 500 MG PO TABS
1000.0000 mg | ORAL_TABLET | ORAL | Status: AC
  Administered 2022-11-30: 1000 mg via ORAL
  Filled 2022-11-30: qty 2

## 2022-11-30 MED ORDER — DEXAMETHASONE SODIUM PHOSPHATE 10 MG/ML IJ SOLN
INTRAMUSCULAR | Status: AC
  Filled 2022-11-30: qty 1

## 2022-11-30 MED ORDER — SCOPOLAMINE 1 MG/3DAYS TD PT72
1.0000 | MEDICATED_PATCH | TRANSDERMAL | Status: DC
  Administered 2022-11-30: 1 via TRANSDERMAL

## 2022-11-30 MED ORDER — SODIUM CHLORIDE 0.9 % IR SOLN
Status: DC | PRN
  Administered 2022-11-30: 1000 mL

## 2022-11-30 MED ORDER — AMISULPRIDE (ANTIEMETIC) 5 MG/2ML IV SOLN: 10.0000 mg | Freq: Once | INTRAVENOUS | Status: DC | PRN

## 2022-11-30 MED ORDER — PHENYLEPHRINE 80 MCG/ML (10ML) SYRINGE FOR IV PUSH (FOR BLOOD PRESSURE SUPPORT)
PREFILLED_SYRINGE | INTRAVENOUS | Status: AC
  Filled 2022-11-30: qty 10

## 2022-11-30 MED ORDER — OXYCODONE HCL 5 MG PO TABS: 5.0000 mg | ORAL_TABLET | Freq: Once | ORAL | Status: DC | PRN

## 2022-11-30 MED ORDER — ONDANSETRON HCL 4 MG/2ML IJ SOLN
INTRAMUSCULAR | Status: DC | PRN
  Administered 2022-11-30: 4 mg via INTRAVENOUS

## 2022-11-30 MED ORDER — BUPIVACAINE-EPINEPHRINE 0.25% -1:200000 IJ SOLN
INTRAMUSCULAR | Status: DC | PRN
  Administered 2022-11-30: 30 mL

## 2022-11-30 MED ORDER — MIDAZOLAM HCL 2 MG/2ML IJ SOLN
INTRAMUSCULAR | Status: AC
  Filled 2022-11-30: qty 2

## 2022-11-30 MED ORDER — HYDROCODONE-ACETAMINOPHEN 5-325 MG PO TABS: 1.0000 | ORAL_TABLET | Freq: Four times a day (QID) | ORAL | 0 refills | Status: AC | PRN

## 2022-11-30 MED ORDER — ROCURONIUM BROMIDE 10 MG/ML (PF) SYRINGE
PREFILLED_SYRINGE | INTRAVENOUS | Status: AC
  Filled 2022-11-30: qty 10

## 2022-11-30 MED ORDER — CEFAZOLIN SODIUM-DEXTROSE 2-4 GM/100ML-% IV SOLN
2.0000 g | INTRAVENOUS | Status: AC
  Administered 2022-11-30: 2 g via INTRAVENOUS
  Filled 2022-11-30: qty 100

## 2022-11-30 MED ORDER — LIDOCAINE 2% (20 MG/ML) 5 ML SYRINGE
INTRAMUSCULAR | Status: DC | PRN
  Administered 2022-11-30: 60 mg via INTRAVENOUS

## 2022-11-30 MED ORDER — CHLORHEXIDINE GLUCONATE 0.12 % MT SOLN
15.0000 mL | Freq: Once | OROMUCOSAL | Status: AC
  Administered 2022-11-30: 15 mL via OROMUCOSAL
  Filled 2022-11-30: qty 15

## 2022-11-30 MED ORDER — MIDAZOLAM HCL 2 MG/2ML IJ SOLN
INTRAMUSCULAR | Status: DC | PRN
  Administered 2022-11-30: 2 mg via INTRAVENOUS

## 2022-11-30 MED ORDER — ONDANSETRON HCL 4 MG/2ML IJ SOLN
INTRAMUSCULAR | Status: AC
  Filled 2022-11-30: qty 2

## 2022-11-30 MED ORDER — LACTATED RINGERS IV SOLN: INTRAVENOUS | Status: DC

## 2022-11-30 MED ORDER — EPHEDRINE 5 MG/ML INJ
INTRAVENOUS | Status: AC
  Filled 2022-11-30: qty 5

## 2022-11-30 MED ORDER — BUPIVACAINE-EPINEPHRINE (PF) 0.25% -1:200000 IJ SOLN
INTRAMUSCULAR | Status: AC
  Filled 2022-11-30: qty 30

## 2022-11-30 MED ORDER — FENTANYL CITRATE (PF) 100 MCG/2ML IJ SOLN
25.0000 ug | INTRAMUSCULAR | Status: DC | PRN
  Administered 2022-11-30 (×4): 25 ug via INTRAVENOUS

## 2022-11-30 MED ORDER — 0.9 % SODIUM CHLORIDE (POUR BTL) OPTIME
TOPICAL | Status: DC | PRN
  Administered 2022-11-30: 1000 mL

## 2022-11-30 MED ORDER — HYDROMORPHONE HCL 1 MG/ML IJ SOLN
INTRAMUSCULAR | Status: AC
  Filled 2022-11-30: qty 0.5

## 2022-11-30 MED ORDER — FENTANYL CITRATE (PF) 250 MCG/5ML IJ SOLN
INTRAMUSCULAR | Status: AC
  Filled 2022-11-30: qty 5

## 2022-11-30 MED ORDER — SCOPOLAMINE 1 MG/3DAYS TD PT72
MEDICATED_PATCH | TRANSDERMAL | Status: AC
  Filled 2022-11-30: qty 1

## 2022-11-30 MED ORDER — ACETAMINOPHEN 325 MG PO TABS: 325.0000 mg | ORAL_TABLET | ORAL | Status: DC | PRN

## 2022-11-30 MED ORDER — FENTANYL CITRATE (PF) 250 MCG/5ML IJ SOLN
INTRAMUSCULAR | Status: DC | PRN
  Administered 2022-11-30: 150 ug via INTRAVENOUS
  Administered 2022-11-30: 100 ug via INTRAVENOUS

## 2022-11-30 MED ORDER — DEXAMETHASONE SODIUM PHOSPHATE 10 MG/ML IJ SOLN
INTRAMUSCULAR | Status: DC | PRN
  Administered 2022-11-30: 10 mg via INTRAVENOUS

## 2022-11-30 MED ORDER — OXYCODONE HCL 5 MG/5ML PO SOLN: 5.0000 mg | Freq: Once | ORAL | Status: DC | PRN

## 2022-11-30 SURGICAL SUPPLY — 37 items
ADH SKN CLS APL DERMABOND .7 (GAUZE/BANDAGES/DRESSINGS) ×1
APL PRP STRL LF DISP 70% ISPRP (MISCELLANEOUS) ×1
APPLIER CLIP 5 13 M/L LIGAMAX5 (MISCELLANEOUS) ×1
APR CLP MED LRG 5 ANG JAW (MISCELLANEOUS) ×1
CANISTER SUCT 3000ML PPV (MISCELLANEOUS) ×1 IMPLANT
CHLORAPREP W/TINT 26 (MISCELLANEOUS) ×1 IMPLANT
CLIP APPLIE 5 13 M/L LIGAMAX5 (MISCELLANEOUS) ×1 IMPLANT
COVER SURGICAL LIGHT HANDLE (MISCELLANEOUS) ×1 IMPLANT
DERMABOND ADVANCED .7 DNX12 (GAUZE/BANDAGES/DRESSINGS) ×1 IMPLANT
ELECT REM PT RETURN 9FT ADLT (ELECTROSURGICAL) ×1
ELECTRODE REM PT RTRN 9FT ADLT (ELECTROSURGICAL) ×1 IMPLANT
GLOVE BIOGEL PI IND STRL 6 (GLOVE) ×1 IMPLANT
GLOVE BIOGEL PI MICRO STRL 5.5 (GLOVE) ×1 IMPLANT
GOWN STRL REUS W/ TWL LRG LVL3 (GOWN DISPOSABLE) ×3 IMPLANT
GOWN STRL REUS W/TWL LRG LVL3 (GOWN DISPOSABLE) ×3
IRRIG SUCT STRYKERFLOW 2 WTIP (MISCELLANEOUS) ×1
IRRIGATION SUCT STRKRFLW 2 WTP (MISCELLANEOUS) ×1 IMPLANT
KIT BASIN OR (CUSTOM PROCEDURE TRAY) ×1 IMPLANT
KIT TURNOVER KIT B (KITS) ×1 IMPLANT
L-HOOK LAP DISP 36CM (ELECTROSURGICAL) ×1
LHOOK LAP DISP 36CM (ELECTROSURGICAL) ×1 IMPLANT
NS IRRIG 1000ML POUR BTL (IV SOLUTION) ×1 IMPLANT
PAD ARMBOARD 7.5X6 YLW CONV (MISCELLANEOUS) ×1 IMPLANT
PENCIL BUTTON HOLSTER BLD 10FT (ELECTRODE) ×1 IMPLANT
SCISSORS LAP 5X35 DISP (ENDOMECHANICALS) ×1 IMPLANT
SET TUBE SMOKE EVAC HIGH FLOW (TUBING) ×1 IMPLANT
SLEEVE Z-THREAD 5X100MM (TROCAR) ×2 IMPLANT
SUT MNCRL AB 4-0 PS2 18 (SUTURE) ×1 IMPLANT
SYS BAG RETRIEVAL 10MM (BASKET) ×1
SYSTEM BAG RETRIEVAL 10MM (BASKET) IMPLANT
TOWEL GREEN STERILE (TOWEL DISPOSABLE) ×1 IMPLANT
TOWEL GREEN STERILE FF (TOWEL DISPOSABLE) ×1 IMPLANT
TRAY LAPAROSCOPIC MC (CUSTOM PROCEDURE TRAY) ×1 IMPLANT
TROCAR BALLN 12MMX100 BLUNT (TROCAR) ×1 IMPLANT
TROCAR Z-THREAD OPTICAL 5X100M (TROCAR) ×1 IMPLANT
WARMER LAPAROSCOPE (MISCELLANEOUS) ×1 IMPLANT
WATER STERILE IRR 1000ML POUR (IV SOLUTION) ×1 IMPLANT

## 2022-11-30 NOTE — Discharge Instructions (Addendum)
[facility] DISCHARGE INSTRUCTIONS  Activity No heavy lifting greater than 15 pounds for 4 weeks after surgery. Ok to shower in 24 hours, but do not bathe or submerge incisions underwater. Do not drive while taking narcotic pain medication. You may drive when you are no longer taking prescription pain medication, you can comfortably wear a seatbelt, and you can safely maneuver your car and apply brakes.  Wound Care Your incisions are covered with skin glue called Dermabond. This will peel off on its own over time. You may shower and allow warm soapy water to run over your incisions. Gently pat dry. Do not submerge your incision underwater until cleared by your surgeon. Monitor your incision for any new redness, tenderness, or drainage. Many patients will experience some swelling and bruising at the incisions.  Ice packs will help.  Swelling and bruising can take several days to resolve.   Medications A  prescription for pain medication may be given to you upon discharge.  Take your pain medication as prescribed, if needed.  If narcotic pain medicine is not needed, then you may take acetaminophen (Tylenol) or ibuprofen (Advil) as needed. It is common to experience some constipation if taking pain medication after surgery.  Increasing fluid intake and taking a stool softener (such as Colace) will usually help or prevent this problem from occurring.  A mild laxative (Milk of Magnesia or Miralax) should be taken according to package directions if there are no bowel movements after 48 hours. Take your usually prescribed medications unless otherwise directed. If you need a refill on your pain medication, please contact your pharmacy.  They will contact our office to request authorization. Prescriptions will not be filled after 5 pm or on weekends.  When to Call us: Fever greater than 100.5 New redness, drainage, or swelling at incision site Severe pain, nausea, or  vomiting Persistent bleeding from incisions Jaundice (yellowing of the whites of the eyes or skin)  Follow-up You have an appointment scheduled with Dr. Freida Busman on December 25, 2022 at 3:10pm. This will be at the [facility] office at [address], [address], [place], [place]. Please arrive at least 15 minutes prior to your scheduled appointment time.  IF YOU HAVE DISABILITY OR FAMILY LEAVE FORMS, YOU MUST BRING THEM TO THE OFFICE FOR PROCESSING.   DO NOT GIVE THEM TO YOUR DOCTOR.  The clinic staff is available to answer your questions during regular business hours.  Please don't hesitate to call and ask to speak to one of the nurses for clinical concerns.  If you have a medical emergency, go to the nearest emergency room or call 911.  A surgeon from [facility] is always on call at the hospital  [address], [address], [place], [place]  [address] ?  [address] [address], [place], [place]   [address] [phone] ? Toll Free: [phone] ? FAX [phone] Web site: [email or web address]      Managing Your Pain After Surgery Without Opioids    Thank you for participating in our program to help patients manage their pain after surgery without opioids. This is part of our effort to provide you with the best care possible, without exposing you or your family to the risk that opioids pose.  What pain can I expect after surgery? You can expect to have some pain after surgery. This is normal. The pain is typically worse the day after surgery, and quickly begins to get better. Many studies have  found that many patients are able to manage their pain after surgery with Over-the-Counter (OTC) medications such as Tylenol and Motrin. If you have a condition that does not allow you to take Tylenol or Motrin, notify your surgical team.  How will I manage my pain? The best strategy for controlling your pain after surgery is around the clock pain control with  Tylenol (acetaminophen) and Motrin (ibuprofen or Advil). Alternating these medications with each other allows you to maximize your pain control. In addition to Tylenol and Motrin, you can use heating pads or ice packs on your incisions to help reduce your pain.  How will I alternate your regular strength over-the-counter pain medication? You will take a dose of pain medication every three hours. Start by taking 650 mg of Tylenol (2 pills of 325 mg) 3 hours later take 600 mg of Motrin (3 pills of 200 mg) 3 hours after taking the Motrin take 650 mg of Tylenol 3 hours after that take 600 mg of Motrin.   - 1 -  See example - if your first dose of Tylenol is at 12:00 PM   12:00 PM Tylenol 650 mg (2 pills of 325 mg)  3:00 PM Motrin 600 mg (3 pills of 200 mg)  6:00 PM Tylenol 650 mg (2 pills of 325 mg)  9:00 PM Motrin 600 mg (3 pills of 200 mg)  Continue alternating every 3 hours   We recommend that you follow this schedule around-the-clock for at least 3 days after surgery, or until you feel that it is no longer needed. Use the table on the last page of this handout to keep track of the medications you are taking. Important: Do not take more than 3000mg  of Tylenol or 3200mg  of Motrin in a 24-hour period. Do not take ibuprofen/Motrin if you have a history of bleeding stomach ulcers, severe kidney disease, &/or actively taking a blood thinner  What if I still have pain? If you have pain that is not controlled with the over-the-counter pain medications (Tylenol and Motrin or Advil) you might have what we call "breakthrough" pain. You will receive a prescription for a small amount of an opioid pain medication such as Oxycodone, Tramadol, or Tylenol with Codeine. Use these opioid pills in the first 24 hours after surgery if you have breakthrough pain. Do not take more than 1 pill every 4-6 hours.  If you still have uncontrolled pain after using all opioid pills, don't hesitate to call our staff  using the number provided. We will help make sure you are managing your pain in the best way possible, and if necessary, we can provide a prescription for additional pain medication.   Day 1    Time  Name of Medication Number of pills taken  Amount of Acetaminophen  Pain Level   Comments  AM PM       AM PM       AM PM       AM PM       AM PM       AM PM       AM PM       AM PM       Total Daily amount of Acetaminophen Do not take more than  3,000 mg per day      Day 2    Time  Name of Medication Number of pills taken  Amount of Acetaminophen  Pain Level   Comments  AM PM  AM PM       AM PM       AM PM       AM PM       AM PM       AM PM       AM PM       Total Daily amount of Acetaminophen Do not take more than  3,000 mg per day      Day 3    Time  Name of Medication Number of pills taken  Amount of Acetaminophen  Pain Level   Comments  AM PM       AM PM       AM PM       AM PM         AM PM       AM PM       AM PM       AM PM       Total Daily amount of Acetaminophen Do not take more than  3,000 mg per day      Day 4    Time  Name of Medication Number of pills taken  Amount of Acetaminophen  Pain Level   Comments  AM PM       AM PM       AM PM       AM PM       AM PM       AM PM       AM PM       AM PM       Total Daily amount of Acetaminophen Do not take more than  3,000 mg per day      Day 5    Time  Name of Medication Number of pills taken  Amount of Acetaminophen  Pain Level   Comments  AM PM       AM PM       AM PM       AM PM       AM PM       AM PM       AM PM       AM PM       Total Daily amount of Acetaminophen Do not take more than  3,000 mg per day      Day 6    Time  Name of Medication Number of pills taken  Amount of Acetaminophen  Pain Level  Comments  AM PM       AM PM       AM PM       AM PM       AM PM       AM PM       AM PM       AM PM       Total Daily amount of  Acetaminophen Do not take more than  3,000 mg per day      Day 7    Time  Name of Medication Number of pills taken  Amount of Acetaminophen  Pain Level   Comments  AM PM       AM PM       AM PM       AM PM       AM PM       AM PM       AM PM       AM PM  Total Daily amount of Acetaminophen Do not take more than  3,000 mg per day        For additional information about how and where to safely dispose of unused opioid medications - [employer]  Disclaimer: This document contains information and/or instructional materials adapted from [facility] for the typical patient with your condition. It does not replace medical advice from your health care provider because your experience may differ from that of the typical patient. Talk to your health care provider if you have any questions about this document, your condition or your treatment plan. Adapted from [facility]

## 2022-11-30 NOTE — Op Note (Signed)
Date: 11/30/22  Patient: YOUSRA WEBBER MRN: [id number]  Preoperative Diagnosis: Symptomatic cholelithiasis Postoperative Diagnosis: Same  Procedure: Laparoscopic cholecystectomy  Surgeon: Sophronia Simas, MD  EBL: 30 mL  Anesthesia: General endotracheal  Specimens: Gallbladder  Indications: [name] is a 52 yo female who presented with RUQ abdominal pain and was found to have cholelithiasis. After a discussion of the risks and benefits of surgery, she agreed to proceed with cholecystectomy.  Findings: Cholelithiasis, no signs of acute cholecystitis.  Procedure details: Informed consent was obtained in the preoperative area prior to the procedure. The patient was brought to the operating room and placed on the table in the supine position. General anesthesia was induced and appropriate lines and drains were placed for intraoperative monitoring. Perioperative antibiotics were administered per SCIP guidelines. The abdomen was prepped and draped in the usual sterile fashion. A pre-procedure timeout was taken verifying patient identity, surgical site and procedure to be performed.  A small infraumbilical skin incision was made, the subcutaneous tissue was divided with cautery, and the umbilical stalk was grasped and elevated. The fascia was incised and the peritoneal cavity was directly visualized. A 12mm Hassan trocar was placed and the abdomen was insufflated. The peritoneal cavity was inspected with no evidence of visceral or vascular injury. Three 5mm ports were placed in the right subcostal margin, all under direct visualization. The fundus of the gallbladder was grasped and retracted cephalad. There were some omental adhesions to the gallbladder, which were carefully taken down with cautery. The infundibulum was then retracted laterally. The peritoneum overlying the gallbladder was incised and the cystic triangle was dissected out using cautery and blunt dissection. There were multiple  small stones in the infundibulum, and the cystic duct was palpated with a grasper prior to division to ensure no stones were impacted in the cystic duct. The critical view of safety was obtained, and the cystic duct and cystic artery were clipped and ligated, leaving three clips behind on the cystic duct stump. The gallbladder was taken off the liver using cautery, and the specimen was placed in an endocatch bag. The surgical site was irrigated with saline until the effluent was clear. Hemostasis was achieved in the gallbladder fossa using cautery. The cystic duct and artery stumps were visually inspected and there was no evidence of bile leak or bleeding. The ports were removed under direct visualization and the abdomen was desufflated. The specimen was extracted via the umbilical port site. The umbilical port site fascia was closed with a 0 vicryl pursestring suture. The skin at all port sites was closed with 4-0 monocryl subcuticular suture. Dermabond was applied.  The patient tolerated the procedure well with no apparent complications. All counts were correct x2 at the end of the procedure. The patient was extubated and taken to PACU in stable condition.  Sophronia Simas, MD 11/30/22 8:36 AM

## 2022-11-30 NOTE — Anesthesia Procedure Notes (Signed)
Procedure Name: Intubation Date/Time: 11/30/2022 7:24 AM  Performed by: Orlin Hilding, CRNAPre-anesthesia Checklist: Patient identified, Emergency Drugs available, Suction available, Patient being monitored and Timeout performed Patient Re-evaluated:Patient Re-evaluated prior to induction Oxygen Delivery Method: Circle system utilized Preoxygenation: Pre-oxygenation with 100% oxygen Induction Type: IV induction Ventilation: Mask ventilation without difficulty and Oral airway inserted - appropriate to patient size Laryngoscope Size: Mac and 3 Grade View: Grade I Tube type: Oral Tube size: 7.0 mm Number of attempts: 1 Placement Confirmation: ETT inserted through vocal cords under direct vision, positive ETCO2 and breath sounds checked- equal and bilateral Secured at: 24 cm Tube secured with: Tape Dental Injury: Teeth and Oropharynx as per pre-operative assessment

## 2022-11-30 NOTE — Transfer of Care (Signed)
  Immediate Anesthesia Transfer of Care Note  Patient: Erin Dorsey  Procedure(s) Performed: LAPAROSCOPIC CHOLECYSTECTOMY (Abdomen)  Patient Location: PACU  Anesthesia Type:General  Level of Consciousness: drowsy and patient cooperative  Airway & Oxygen Therapy: Patient Spontanous Breathing and Patient connected to face mask oxygen  Post-op Assessment: Report given to RN and Post -op Vital signs reviewed and stable  Post vital signs: Reviewed and stable  Last Vitals:  Vitals Value Taken Time  BP 140/75 11/30/22 0815  Temp    Pulse 69 11/30/22 0816  Resp 15 11/30/22 0816  SpO2 100 % 11/30/22 0816  Vitals shown include unfiled device data.  Last Pain:  Vitals:   11/30/22 0657  TempSrc:   PainSc: 7          Complications: No notable events documented.

## 2022-11-30 NOTE — Interval H&P Note (Signed)
History and Physical Interval Note:  11/30/2022 7:01 AM  Erin Dorsey  has presented today for surgery, with the diagnosis of GALLSTONES.  The various methods of treatment have been discussed with the patient and family. After consideration of risks, benefits and other options for treatment, the patient has consented to  Procedure(s): LAPAROSCOPIC CHOLECYSTECTOMY (N/A) as a surgical intervention.  The patient's history has been reviewed, patient examined, no change in status, stable for surgery.  I have reviewed the patient's chart and labs.  Questions were answered to the patient's satisfaction.     Fritzi Mandes

## 2022-11-30 NOTE — Anesthesia Preprocedure Evaluation (Addendum)
Anesthesia Evaluation  Patient identified by MRN, date of birth, ID band Patient awake    Reviewed: Allergy & Precautions, NPO status , Patient's Chart, lab work & pertinent test results  Airway Mallampati: I  TM Distance: >3 FB Neck ROM: Full    Dental  (+) Teeth Intact, Dental Advisory Given, Edentulous Upper, Edentulous Lower   Pulmonary asthma , former smoker   breath sounds clear to auscultation       Cardiovascular hypertension,  Rhythm:Regular Rate:Normal     Neuro/Psych  Headaches PSYCHIATRIC DISORDERS Anxiety Depression     Neuromuscular disease    GI/Hepatic Neg liver ROS,GERD  Medicated,,  Endo/Other  negative endocrine ROS    Renal/GU negative Renal ROS     Musculoskeletal negative musculoskeletal ROS (+)    Abdominal   Peds  Hematology negative hematology ROS (+)   Anesthesia Other Findings   Reproductive/Obstetrics                             Anesthesia Physical Anesthesia Plan  ASA: 2  Anesthesia Plan: General   Post-op Pain Management: Tylenol PO (pre-op)* and Celebrex PO (pre-op)*   Induction: Intravenous  PONV Risk Score and Plan: 4 or greater and Ondansetron, Dexamethasone, Midazolam and Scopolamine patch - Pre-op  Airway Management Planned: Oral ETT  Additional Equipment: None  Intra-op Plan:   Post-operative Plan: Extubation in OR  Informed Consent: I have reviewed the patients History and Physical, chart, labs and discussed the procedure including the risks, benefits and alternatives for the proposed anesthesia with the patient or authorized representative who has indicated his/her understanding and acceptance.     Dental advisory given  Plan Discussed with: CRNA  Anesthesia Plan Comments:        Anesthesia Quick Evaluation

## 2022-11-30 NOTE — Anesthesia Postprocedure Evaluation (Signed)
Anesthesia Post Note  Patient: [name]  Procedure(s) Performed: LAPAROSCOPIC CHOLECYSTECTOMY (Abdomen)     Patient location during evaluation: PACU Anesthesia Type: General Level of consciousness: awake and alert Pain management: pain level controlled Vital Signs Assessment: post-procedure vital signs reviewed and stable Respiratory status: spontaneous breathing, nonlabored ventilation, respiratory function stable and patient connected to nasal cannula oxygen Cardiovascular status: blood pressure returned to baseline and stable Postop Assessment: no apparent nausea or vomiting Anesthetic complications: no  No notable events documented.  Last Vitals:  Vitals:   11/30/22 0915 11/30/22 0918  BP: 130/77   Pulse: 69 66  Resp: 13 12  Temp:  36.5 C  SpO2: 95% 94%    Last Pain:  Vitals:   11/30/22 0845  TempSrc:   PainSc: 10-Worst pain ever                 Shelton Silvas

## 2022-12-01 ENCOUNTER — Other Ambulatory Visit: Payer: Self-pay | Admitting: Family

## 2022-12-01 ENCOUNTER — Encounter (HOSPITAL_COMMUNITY): Payer: Self-pay | Admitting: Surgery

## 2022-12-01 DIAGNOSIS — K130 Diseases of lips: Secondary | ICD-10-CM

## 2022-12-03 ENCOUNTER — Encounter: Payer: Self-pay | Admitting: Family

## 2022-12-04 NOTE — Progress Notes (Signed)
noted 

## 2022-12-05 ENCOUNTER — Other Ambulatory Visit: Payer: Self-pay

## 2022-12-05 ENCOUNTER — Other Ambulatory Visit: Payer: Self-pay | Admitting: Family

## 2022-12-05 DIAGNOSIS — L304 Erythema intertrigo: Secondary | ICD-10-CM

## 2022-12-05 DIAGNOSIS — N3941 Urge incontinence: Secondary | ICD-10-CM

## 2022-12-05 NOTE — Telephone Encounter (Signed)
Pt put her message in the subject line, please review

## 2022-12-08 ENCOUNTER — Ambulatory Visit: Payer: Medicaid Other | Admitting: Family

## 2022-12-14 ENCOUNTER — Encounter (INDEPENDENT_AMBULATORY_CARE_PROVIDER_SITE_OTHER): Payer: Self-pay

## 2022-12-25 ENCOUNTER — Ambulatory Visit: Payer: Medicaid Other | Admitting: Family

## 2022-12-28 ENCOUNTER — Other Ambulatory Visit: Payer: Self-pay | Admitting: Family

## 2023-01-02 ENCOUNTER — Other Ambulatory Visit: Payer: Self-pay | Admitting: Family

## 2023-01-02 DIAGNOSIS — A6000 Herpesviral infection of urogenital system, unspecified: Secondary | ICD-10-CM

## 2023-01-15 ENCOUNTER — Encounter: Payer: Self-pay | Admitting: Family

## 2023-01-15 ENCOUNTER — Ambulatory Visit: Payer: Medicaid Other | Admitting: Family

## 2023-01-15 ENCOUNTER — Encounter: Payer: Self-pay | Admitting: *Deleted

## 2023-01-15 VITALS — BP 130/84 | HR 78 | Temp 97.9°F | Ht 69.0 in | Wt 200.0 lb

## 2023-01-15 DIAGNOSIS — Z79899 Other long term (current) drug therapy: Secondary | ICD-10-CM

## 2023-01-15 DIAGNOSIS — E538 Deficiency of other specified B group vitamins: Secondary | ICD-10-CM

## 2023-01-15 DIAGNOSIS — K2100 Gastro-esophageal reflux disease with esophagitis, without bleeding: Secondary | ICD-10-CM

## 2023-01-15 DIAGNOSIS — R5383 Other fatigue: Secondary | ICD-10-CM | POA: Diagnosis not present

## 2023-01-15 DIAGNOSIS — I10 Essential (primary) hypertension: Secondary | ICD-10-CM

## 2023-01-15 DIAGNOSIS — R296 Repeated falls: Secondary | ICD-10-CM

## 2023-01-15 DIAGNOSIS — R0683 Snoring: Secondary | ICD-10-CM | POA: Diagnosis not present

## 2023-01-15 DIAGNOSIS — G4719 Other hypersomnia: Secondary | ICD-10-CM | POA: Diagnosis not present

## 2023-01-15 DIAGNOSIS — J4520 Mild intermittent asthma, uncomplicated: Secondary | ICD-10-CM

## 2023-01-15 DIAGNOSIS — E876 Hypokalemia: Secondary | ICD-10-CM | POA: Diagnosis not present

## 2023-01-15 DIAGNOSIS — R200 Anesthesia of skin: Secondary | ICD-10-CM

## 2023-01-15 LAB — BASIC METABOLIC PANEL
BUN: 7 mg/dL (ref 6–23)
CO2: 32 meq/L (ref 19–32)
Calcium: 8.3 mg/dL — ABNORMAL LOW (ref 8.4–10.5)
Chloride: 103 meq/L (ref 96–112)
Creatinine, Ser: 0.65 mg/dL (ref 0.40–1.20)
GFR: 102.61 mL/min (ref >60.00)
Glucose, Bld: 73 mg/dL (ref 70–99)
Potassium: 3.5 meq/L (ref 3.5–5.1)
Sodium: 142 meq/L (ref 135–145)

## 2023-01-15 LAB — CBC
HCT: 39.4 % (ref 36.0–46.0)
Hemoglobin: 12.8 g/dL (ref 12.0–15.0)
MCHC: 32.5 g/dL (ref 30.0–36.0)
MCV: 99.1 fl (ref 78.0–100.0)
Platelets: 225 10*3/uL (ref 150.0–400.0)
RBC: 3.97 Mil/uL (ref 3.87–5.11)
RDW: 13.8 % (ref 11.5–15.5)
WBC: 5.6 10*3/uL (ref 4.0–10.5)

## 2023-01-15 LAB — VITAMIN B12: Vitamin B-12: 322 pg/mL (ref 211–911)

## 2023-01-15 MED ORDER — CYANOCOBALAMIN 1000 MCG/ML IJ SOLN
1000.0000 ug | Freq: Once | INTRAMUSCULAR | Status: AC
Start: 2023-01-15 — End: 2023-01-15
  Administered 2023-01-15: 1000 ug via INTRAMUSCULAR

## 2023-01-15 MED ORDER — AMITRIPTYLINE HCL 25 MG PO TABS: 25.0000 mg | ORAL_TABLET | Freq: Every day | ORAL | 3 refills | Status: DC

## 2023-01-15 NOTE — Assessment & Plan Note (Signed)
D/w pt that daily administration of albuterol is not approriate.  Albuterol only as needed.  Flovent daily, pt to report to me if there is increased sob as we can change or increase maintenance medication.

## 2023-01-15 NOTE — Progress Notes (Signed)
Established Patient Office Visit  Subjective:      CC:  Chief Complaint  Patient presents with   Follow-up    Wants a B12 injection today too.    HPI: Erin Dorsey is a 52 y.o. female presenting on 01/15/2023 for Follow-up (Wants a B12 injection today too.) . Went to ER 6/2 with abdominal pain N/V , ruq u/s with cholethiasis and mild bile duct dilation. MRCP CBD 7 mm diameter , cholecystectomy discussed.  Lap cholecystectomy performed 8/1  Saw surgeon Dr. Freida Busman post op , last seen 8/26, per note incisions heaing well and f/u scheduled for one month.   B12 def: completed three months b12 injections and is still taking otc b12 1000 mcg once daily.   Prediabetics, increased slightly four months ago to 6.0. pt working on diabetic diet, has not been eating as much due to recent s/p lab cholecystecomy   New complaints: Increased daytime fatigue, does snore and at times wakes up gasping for air in the middle of the night. She has never been evaluated for a sleep study.   Eye appt scheduled for 02/13/23  Asthma: albuterol  Flovent    Social history:  Relevant past medical, surgical, family and social history reviewed and updated as indicated. Interim medical history since our last visit reviewed.  Allergies and medications reviewed and updated.  DATA REVIEWED: CHART IN EPIC     ROS: Negative unless specifically indicated above in HPI.    Current Outpatient Medications:    albuterol (VENTOLIN HFA) 108 (90 Base) MCG/ACT inhaler, Inhale 2 puffs into the lungs every 6 (six) hours as needed for wheezing or shortness of breath., Disp: 8 g, Rfl: 2   busPIRone (BUSPAR) 15 MG tablet, Take 1 tablet (15 mg total) by mouth 3 (three) times daily. Will need to be seen in office for more refills., Disp: 270 tablet, Rfl: 3   clotrimazole-betamethasone (LOTRISONE) cream, APPLY 1 APPLICATION TOPICALLY DAILY, Disp: 15 g, Rfl: 1   FLUoxetine HCl 60 MG TABS, Take 60 mg by mouth daily.,  Disp: 90 tablet, Rfl: 3   fluticasone (FLOVENT HFA) 110 MCG/ACT inhaler, Inhale 1 puff into the lungs in the morning and at bedtime., Disp: 1 each, Rfl: 12   gabapentin (NEURONTIN) 300 MG capsule, TAKE 1 CAP BY MOUTH 3 TIMES DAILY AS DIRECTED,1 CAPSULE IN AM, 1 CAPSULE AT NOON, THEN 3 CAPSULES NIGHTLY, Disp: 450 capsule, Rfl: 2   Hydrocortisone Acetate 1 % CREA, Apply 1 application  topically daily as needed (heat rash)., Disp: 15 g, Rfl: 0   hydrOXYzine (ATARAX) 25 MG tablet, Take 1 tablet (25 mg total) by mouth at bedtime., Disp: 90 tablet, Rfl: 1   mupirocin ointment (BACTROBAN) 2 %, APPLY TO AFFECTED AREA TWICE A DAY, Disp: 22 g, Rfl: 0   nystatin (MYCOSTATIN/NYSTOP) powder, APPLY TO AFFECTED AREA 3 TIMES A DAY, Disp: 15 g, Rfl: 5   omeprazole (PRILOSEC) 20 MG capsule, TAKE 1 CAPSULE BY MOUTH EVERY DAY, Disp: 90 capsule, Rfl: 1   ondansetron (ZOFRAN) 4 MG tablet, TAKE 1 TABLET BY MOUTH EVERY 8 HOURS AS NEEDED FOR NAUSEA AND VOMITING, Disp: 20 tablet, Rfl: 0   pravastatin (PRAVACHOL) 10 MG tablet, Take 10 mg by mouth at bedtime., Disp: , Rfl:    promethazine (PHENERGAN) 25 MG tablet, Take 25 mg by mouth every 6 (six) hours as needed for nausea or vomiting., Disp: , Rfl:    SUBOXONE 8-2 MG FILM, Take 1 Film by mouth 3 (three)  times daily., Disp: , Rfl:    valACYclovir (VALTREX) 500 MG tablet, TAKE 2 TABLETS BY MOUTH EVERY DAY, Disp: 60 tablet, Rfl: 2   amitriptyline (ELAVIL) 25 MG tablet, Take 1 tablet (25 mg total) by mouth at bedtime., Disp: 90 tablet, Rfl: 3      Objective:    BP 130/84 (BP Location: Left Arm, Patient Position: Sitting, Cuff Size: Normal)   Pulse 78   Temp 97.9 F (36.6 C) (Temporal)   Ht 5\' 9"  (1.753 m)   Wt 200 lb (90.7 kg)   LMP 06/23/2015   SpO2 98%   BMI 29.53 kg/m   Wt Readings from Last 3 Encounters:  01/15/23 200 lb (90.7 kg)  11/30/22 198 lb (89.8 kg)  11/28/22 197 lb (89.4 kg)    Physical Exam Constitutional:      General: She is not in acute  distress.    Appearance: Normal appearance. She is normal weight. She is not ill-appearing, toxic-appearing or diaphoretic.  HENT:     Head: Normocephalic.  Cardiovascular:     Rate and Rhythm: Normal rate and regular rhythm.  Pulmonary:     Effort: Pulmonary effort is normal.     Breath sounds: Normal breath sounds.  Musculoskeletal:        General: Normal range of motion.  Neurological:     General: No focal deficit present.     Mental Status: She is alert and oriented to person, place, and time. Mental status is at baseline.  Psychiatric:        Mood and Affect: Mood normal.        Behavior: Behavior normal.        Thought Content: Thought content normal.        Judgment: Judgment normal.           Assessment & Plan:  Low potassium syndrome -     Basic metabolic panel  Snoring -     Ambulatory referral to Sleep Studies  Excessive daytime sleepiness Assessment & Plan: Ordering sleep study  R/o osa  Medication management, referral sent to pharmacy   Orders: -     Ambulatory referral to Sleep Studies  Other fatigue -     CBC  Vitamin B12 deficiency -     Vitamin B12 -     Cyanocobalamin  Numbness -     Amitriptyline HCl; Take 1 tablet (25 mg total) by mouth at bedtime.  Dispense: 90 tablet; Refill: 3  Essential hypertension Assessment & Plan: Stable.     Mild intermittent asthma without complication Assessment & Plan: D/w pt that daily administration of albuterol is not approriate.  Albuterol only as needed.  Flovent daily, pt to report to me if there is increased sob as we can change or increase maintenance medication.     Gastroesophageal reflux disease with esophagitis without hemorrhage Assessment & Plan: Goal is to get off of omeprazole, hopefully with cholecystectomy as of recent will decrease GERD    Polypharmacy Assessment & Plan: Increased risk for falls and sedative type effects.  Referral placed for medication management with  pharmacy to hopefully come up with strategies/changes to decrease risk   Orders: -     AMB Referral to Pharmacy Medication Management  Frequent falls -     AMB Referral to Pharmacy Medication Management     Return in about 6 months (around 07/15/2023) for f/u CPE.  Mort Sawyers, MSN, APRN, FNP-C  [facility]

## 2023-01-15 NOTE — Assessment & Plan Note (Signed)
Stable

## 2023-01-15 NOTE — Assessment & Plan Note (Signed)
Ordering sleep study  R/o osa  Medication management, referral sent to pharmacy

## 2023-01-15 NOTE — Patient Instructions (Addendum)
  A referral was placed today for sleep study.  Please let us know if you have not heard back within 2 weeks about the referral.  Take b12 1000 mcg once daily.   Recommendation of flovent twice daily, not albuterol  Albuterol is only for as needed when really short of breath.  If you find you are needing the rescue inhaler(albuterol) more than a few times a week then we need to increase your flovent (which is maintenance inhaler)   Regards,   Mort Sawyers FNP-C

## 2023-01-15 NOTE — Assessment & Plan Note (Signed)
Goal is to get off of omeprazole, hopefully with cholecystectomy as of recent will decrease GERD

## 2023-01-15 NOTE — Assessment & Plan Note (Signed)
Increased risk for falls and sedative type effects.  Referral placed for medication management with pharmacy to hopefully come up with strategies/changes to decrease risk

## 2023-01-17 ENCOUNTER — Telehealth: Payer: Self-pay

## 2023-01-17 NOTE — Progress Notes (Unsigned)
Care Guide Note  01/17/2023 Name: Erin Dorsey MRN: [id number] DOB: 1970-12-27  Referred by: Mort Sawyers, FNP Reason for referral : Care Coordination (Outreach to schedule with Pharm d )   Erin Dorsey is a 52 y.o. year old female who is a primary care patient of Mort Sawyers, FNP. [name] was referred to the pharmacist for assistance related to HTN, Anxiety, and Depression.    An unsuccessful telephone outreach was attempted today to contact the patient who was referred to the pharmacy team for assistance with medication management. Additional attempts will be made to contact the patient.   Penne Lash, RMA Care Guide [facility]  [place], [place] [address] Direct Dial: [phone] [email or web address]

## 2023-01-19 NOTE — Progress Notes (Unsigned)
Care Guide Note  01/19/2023 Name: Erin Dorsey MRN: [id number] DOB: 12/04/1970  Referred by: Mort Sawyers, FNP Reason for referral : Care Coordination (Outreach to schedule with Pharm d )   Erin Dorsey is a 52 y.o. year old female who is a primary care patient of Mort Sawyers, FNP. Erin Dorsey was referred to the pharmacist for assistance related to HTN, Anxiety, and Depression.    A second unsuccessful telephone outreach was attempted today to contact the patient who was referred to the pharmacy team for assistance with medication management. Additional attempts will be made to contact the patient.  Penne Lash, RMA Care Guide [facility]  [place], [place] [address] Direct Dial: [phone] [email or web address]

## 2023-01-22 NOTE — Progress Notes (Signed)
Care Guide Note  01/22/2023 Name: [name] MRN: [id number] DOB: [birth year]  Referred by: Mort Sawyers, FNP Reason for referral : Care Coordination (Outreach to schedule with Pharm d )   Erin Dorsey is a 52 y.o. year old female who is a primary care patient of Mort Sawyers, FNP. [name] was referred to the pharmacist for assistance related to HTN, Anxiety, and Depression.    Successful contact was made with the patient to discuss pharmacy services including being ready for the pharmacist to call at least 5 minutes before the scheduled appointment time, to have medication bottles and any blood sugar or blood pressure readings ready for review. The patient agreed to meet with the pharmacist via with the pharmacist via telephone visit on (date/time).  02/07/2023  Penne Lash, RMA Care Guide [facility]  [place], [place] [address] Direct Dial: [phone] [email or web address]

## 2023-02-01 ENCOUNTER — Telehealth: Payer: Self-pay | Admitting: Family

## 2023-02-01 NOTE — Telephone Encounter (Signed)
Erin Sawyers, FNP  Erin Dorsey; P Dugal Pool Thanks [place].  Dugal pool, can you reach out to patient and advise her we have been trying to call in regards to set up appointment with psychiatry to help get her anxiety better in control? -------------------------------------------------------------------- LM for pt to return call.

## 2023-02-02 ENCOUNTER — Encounter: Payer: Self-pay | Admitting: Family

## 2023-02-02 NOTE — Telephone Encounter (Signed)
LM for pt to returncall

## 2023-02-06 NOTE — Telephone Encounter (Signed)
LM for pt to returncall

## 2023-02-07 ENCOUNTER — Other Ambulatory Visit: Payer: Medicaid Other

## 2023-02-07 ENCOUNTER — Encounter: Payer: Self-pay | Admitting: Pharmacist

## 2023-02-07 NOTE — Progress Notes (Signed)
Attempted to contact patient for scheduled appointment for medication management. Patient answered though requested call back at later time due to low phone battery. Upon reaching out at requested time, was unable to reach patient. Left HIPAA compliant message for patient to return my call at their convenience.   Loree Fee, PharmD Clinical Pharmacist [facility] [phone]

## 2023-02-07 NOTE — Progress Notes (Deleted)
02/07/2023 Name: Erin Dorsey MRN: [id number] DOB: 06/03/1970  Subjective  No chief complaint on file.   Reason for visit: Erin Dorsey is a 52 y.o. year old female who presented for a telephone visit.   They were referred to the pharmacist by their PCP for assistance in managing  Anxiety, Depression, Polypharmacy with recurrent Falls .   Care Team: Primary Care Provider: Mort Sawyers, FNP Referral to Psych: ***  Reason for visit: ?  Erin Dorsey is a 52 y.o. female who presents today for an initial diabetes pharmacotherapy visit.? Pertinent PMH also includes ***.  PMH: Anxity, depression, chronic pain, BPD   Since Last visit / History of Present Illness: ?  Patient reports implementing*** plan from last visit. Denies adverse effects with titration of ***.   Prescription drug coverage: Payor: [place] MEDICAID PREPAID HEALTH PLAN / Plan: [place] MEDICAID [employer] [place] / Product Type: *No Product type* / .   Reports that all medications are *** affordable.  Current Patient Assistance: None*** Medication Adherence: Patient denies*** missing doses of their medication.    Reported Psych Regimen: ?  Amitriptyline 25 mg HS (prescribed for numbness/neuropathy) Buspirone 15 mg TID (started 02/03/20. Added to fluoxetine for uncontrolled anxiety, partial responder) Fluoxetine 60 mg daily (increased to 60 mg 02/03/20)  Misc: May increase fall risk Promethazine 25 mg q6h PRN  Hydroxyzine 25 mg HS Gabapentin 300/300/900 mg AM/Noon/HS Suboxone 8/2 BID   Recurrent MDD and anxiety: referral placed last visit to psychiatry. Was given referral for psychiatry last visit and did not receive any information. She has not reached out to any medicaid psychiatrists   Patient reports concern of frequent falls, excessive daytime sleepiness, mental *** foggyness.   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        _______________________________________________  Objective     Review of Systems:?  Constitutional:? No fever, chills or unintentional weight loss  Cardiovascular:? No chest pain or pressure, shortness of breath, dyspnea on exertion, orthopnea or LE edema  Pulmonary:? No cough or shortness of breath  GI:? No nausea, vomiting, constipation, diarrhea, abdominal pain, dyspepsia, change in bowel habits  Endocrine:? No polyuria, polyphagia or blurred vision  Psych:? No depression, anxiety, insomnia    Physical Examination:  Vitals:  Wt Readings from Last 3 Encounters:  01/15/23 200 lb (90.7 kg)  11/30/22 198 lb (89.8 kg)  11/28/22 197 lb (89.4 kg)   BP Readings from Last 3 Encounters:  01/15/23 130/84  11/30/22 130/77  11/28/22 126/78   Pulse Readings from Last 3 Encounters:  01/15/23 78  11/30/22 66  11/28/22 93     Labs:?  Lab Results  Component Value Date   HGBA1C 6.0 08/29/2022   HGBA1C 5.4 08/06/2019   GLUCOSE 73 01/15/2023   CREATININE 0.65 01/15/2023   CREATININE 0.78 11/28/2022   CREATININE 0.73 11/23/2022   GFR 102.61 01/15/2023   GFR 88.60 11/28/2022   GFR 102.89 08/29/2022    Lab Results  Component Value Date   CHOL 169 08/29/2022   LDLCALC 98 08/29/2022   LDLCALC 134 (H) 11/25/2021   LDLCALC 94 08/06/2019   HDL 45.40 08/29/2022   HDL 40.50 11/25/2021   HDL 33.10 (L) 08/06/2019   AST 22 11/23/2022   AST 22 10/01/2022   ALT 18 11/23/2022   ALT 18 10/01/2022      Chemistry      Component Value Date/Time   NA 142 01/15/2023 0803   K 3.5 01/15/2023 0803  CL 103 01/15/2023 0803   CO2 32 01/15/2023 0803   BUN 7 01/15/2023 0803   CREATININE 0.65 01/15/2023 0803      Component Value Date/Time   CALCIUM 8.3 (L) 01/15/2023 0803   ALKPHOS 40 11/23/2022 0820   AST 22 11/23/2022 0820   ALT 18 11/23/2022 0820   BILITOT 0.8 11/23/2022 0820       The 10-year ASCVD risk score (Arnett DK, et al., 2019) is: 1.3%  Assessment and Plan:   1. Polypharmacy Significant concern for numerous medications possibly  contributing to CNS depression and other adverse effects.   Strong anticholinergic effects: Amitriptyline, promethazine, hydroxyzine   have been trying to call in regards to set up appointment with psychiatry to help get her anxiety better in control?     Anxiety: Fluoxetine  Buspirone: Max dose 30 mg/day divided (20 TID) - Common ADE: Dizziness, drowsiness - Less common: Also possible mild to moderate CNS effects such as headache, nervousness, excitement, and to a lesser degree, drowsiness and insomnia. Hydroxyzine: Sedating, drowsiness is common, cognitive impairment   Neuropathy: Gabapentin Amitriptyline    For antidepressant medications, gradually taper over 2-4 weeks given long-term use of most agents on current medication list.   Follow Up Follow up with clinical pharmacist via *** in *** weeks.  ?   Future Appointments  Date Time Provider Department Center  02/07/2023  1:30 PM [facility] CCM PHARMACIST [facility]  02/28/2023  9:20 AM Mort Sawyers, FNP [facility]     Loree Fee, PharmD Clinical Pharmacist [facility] [phone]

## 2023-02-09 ENCOUNTER — Other Ambulatory Visit: Payer: Self-pay | Admitting: Family

## 2023-02-09 DIAGNOSIS — F411 Generalized anxiety disorder: Secondary | ICD-10-CM

## 2023-02-09 DIAGNOSIS — F331 Major depressive disorder, recurrent, moderate: Secondary | ICD-10-CM

## 2023-02-14 ENCOUNTER — Telehealth: Payer: Self-pay

## 2023-02-14 NOTE — Progress Notes (Signed)
Care Guide Note  02/14/2023 Name: LAVETA KANTER MRN: [id number] DOB: 23-May-1970  Referred by: Mort Sawyers, FNP Reason for referral : Care Coordination (Outreach to reschedule with Pharm d )   DONNELLE MCBROOM is a 52 y.o. year old female who is a primary care patient of Mort Sawyers, FNP. [name] was referred to the pharmacist for assistance related to  polypharmacy  .    An unsuccessful telephone outreach was attempted today to contact the patient who was referred to the pharmacy team for assistance with medication management. Additional attempts will be made to contact the patient.   Penne Lash, RMA Care Guide [facility]  [place], [place] [address] Direct Dial: [phone] [email or web address]

## 2023-02-15 NOTE — Progress Notes (Signed)
noted 

## 2023-02-21 NOTE — Progress Notes (Signed)
Care Coordination Note  02/21/2023 Name: Erin Dorsey MRN: [id number] DOB: April 07, 1971  Erin Dorsey is a 52 y.o. year old female who is a primary care patient of Mort Sawyers, FNP and is actively engaged with the Care Management team. I reached out to Erin Dorsey by phone today to assist with re-scheduling an initial visit with the Pharmacist  Follow up plan: Unsuccessful telephone outreach attempt made. A HIPAA compliant phone message was left for the patient providing contact information and requesting a return call.  If patient returns call to provider office, please advise to call Care Guide Penne Lash  at [phone]  Penne Lash, RMA Care Guide [facility]  [place], [place] [address] Direct Dial: [phone] [email or web address]

## 2023-02-28 ENCOUNTER — Encounter: Payer: Medicaid Other | Admitting: Family

## 2023-03-01 NOTE — Progress Notes (Signed)
Care Coordination Note  03/01/2023 Name: [name] MRN: [id number] DOB: 09-Jun-1970  Erin Dorsey is a 52 y.o. year old female who is a primary care patient of Mort Sawyers, FNP and is actively engaged with the Chronic Care Management team. I reached out to Erin Dorsey by phone today to assist with re-scheduling an initial visit with the Pharmacist  Follow up plan: Unable to make contact on outreach attempts x 3. PCP Mort Sawyers, FNP notified via routed documentation in medical record.   Penne Lash, RMA Care Guide [facility]  [place], [place] [address] Direct Dial: [phone] [email or web address]

## 2023-03-05 ENCOUNTER — Other Ambulatory Visit: Payer: Self-pay | Admitting: Family

## 2023-03-05 DIAGNOSIS — J40 Bronchitis, not specified as acute or chronic: Secondary | ICD-10-CM

## 2023-03-12 ENCOUNTER — Other Ambulatory Visit: Payer: Self-pay | Admitting: Family

## 2023-03-12 DIAGNOSIS — K130 Diseases of lips: Secondary | ICD-10-CM

## 2023-03-13 MED ORDER — MUPIROCIN 2 % EX OINT: TOPICAL_OINTMENT | Freq: Every day | CUTANEOUS | 0 refills | Status: DC

## 2023-04-03 ENCOUNTER — Other Ambulatory Visit: Payer: Self-pay | Admitting: Family

## 2023-04-03 DIAGNOSIS — K130 Diseases of lips: Secondary | ICD-10-CM

## 2023-04-03 MED ORDER — MUPIROCIN 2 % EX OINT: TOPICAL_OINTMENT | Freq: Every day | CUTANEOUS | 0 refills | Status: DC

## 2023-04-04 ENCOUNTER — Other Ambulatory Visit: Payer: Self-pay | Admitting: Family

## 2023-04-11 ENCOUNTER — Other Ambulatory Visit: Payer: Self-pay | Admitting: Family

## 2023-04-11 DIAGNOSIS — L304 Erythema intertrigo: Secondary | ICD-10-CM

## 2023-04-12 ENCOUNTER — Other Ambulatory Visit: Payer: Self-pay | Admitting: Family

## 2023-04-12 DIAGNOSIS — A6000 Herpesviral infection of urogenital system, unspecified: Secondary | ICD-10-CM

## 2023-04-17 ENCOUNTER — Other Ambulatory Visit: Payer: Self-pay | Admitting: Family

## 2023-04-19 ENCOUNTER — Other Ambulatory Visit: Payer: Self-pay | Admitting: Family

## 2023-04-19 DIAGNOSIS — K130 Diseases of lips: Secondary | ICD-10-CM

## 2023-04-25 ENCOUNTER — Other Ambulatory Visit: Payer: Self-pay | Admitting: Family

## 2023-04-25 DIAGNOSIS — K130 Diseases of lips: Secondary | ICD-10-CM

## 2023-04-29 MED ORDER — MUPIROCIN 2 % EX OINT: TOPICAL_OINTMENT | Freq: Every day | CUTANEOUS | 0 refills | Status: DC

## 2023-04-30 ENCOUNTER — Other Ambulatory Visit: Payer: Self-pay | Admitting: *Deleted

## 2023-04-30 MED ORDER — CETIRIZINE HCL 10 MG PO TABS: 10.0000 mg | ORAL_TABLET | Freq: Every day | ORAL | 3 refills | Status: DC

## 2023-05-03 ENCOUNTER — Encounter: Payer: Self-pay | Admitting: Family

## 2023-05-03 DIAGNOSIS — R252 Cramp and spasm: Secondary | ICD-10-CM

## 2023-05-03 DIAGNOSIS — R296 Repeated falls: Secondary | ICD-10-CM

## 2023-05-03 DIAGNOSIS — R531 Weakness: Secondary | ICD-10-CM

## 2023-05-03 DIAGNOSIS — R270 Ataxia, unspecified: Secondary | ICD-10-CM

## 2023-05-03 NOTE — Telephone Encounter (Signed)
 Noted and agree.

## 2023-05-07 ENCOUNTER — Encounter: Payer: Self-pay | Admitting: Family

## 2023-05-07 ENCOUNTER — Ambulatory Visit: Payer: Medicaid Other | Admitting: Family

## 2023-05-07 ENCOUNTER — Telehealth: Payer: Self-pay | Admitting: Family

## 2023-05-07 ENCOUNTER — Ambulatory Visit
Admission: RE | Admit: 2023-05-07 | Discharge: 2023-05-07 | Disposition: A | Discharge status: Home / Self Care | Payer: Medicaid Other | Source: Ambulatory Visit | Attending: Family | Admitting: Family

## 2023-05-07 VITALS — BP 126/80 | HR 101 | Temp 98.2°F | Ht 68.0 in | Wt 197.4 lb

## 2023-05-07 DIAGNOSIS — R5383 Other fatigue: Secondary | ICD-10-CM

## 2023-05-07 DIAGNOSIS — R252 Cramp and spasm: Secondary | ICD-10-CM | POA: Insufficient documentation

## 2023-05-07 DIAGNOSIS — E66811 Obesity, class 1: Secondary | ICD-10-CM

## 2023-05-07 DIAGNOSIS — R270 Ataxia, unspecified: Secondary | ICD-10-CM | POA: Insufficient documentation

## 2023-05-07 DIAGNOSIS — M5441 Lumbago with sciatica, right side: Secondary | ICD-10-CM

## 2023-05-07 DIAGNOSIS — R29898 Other symptoms and signs involving the musculoskeletal system: Secondary | ICD-10-CM | POA: Insufficient documentation

## 2023-05-07 DIAGNOSIS — R296 Repeated falls: Secondary | ICD-10-CM

## 2023-05-07 DIAGNOSIS — R4701 Aphasia: Secondary | ICD-10-CM | POA: Diagnosis not present

## 2023-05-07 DIAGNOSIS — G8929 Other chronic pain: Secondary | ICD-10-CM | POA: Insufficient documentation

## 2023-05-07 DIAGNOSIS — E876 Hypokalemia: Secondary | ICD-10-CM

## 2023-05-07 DIAGNOSIS — E538 Deficiency of other specified B group vitamins: Secondary | ICD-10-CM

## 2023-05-07 MED ORDER — GADOBUTROL 1 MMOL/ML IV SOLN
9.0000 mL | Freq: Once | INTRAVENOUS | Status: AC | PRN
  Administered 2023-05-07: 9 mL via INTRAVENOUS

## 2023-05-07 MED ORDER — BACLOFEN 10 MG PO TABS: 5.0000 mg | ORAL_TABLET | Freq: Three times a day (TID) | ORAL | 0 refills | Status: DC | PRN

## 2023-05-07 NOTE — Assessment & Plan Note (Signed)
 Newer onset.  Discussed increased risk for falls.  Mri w /wo  Referral to neurology  Order placed for rolling walker

## 2023-05-07 NOTE — Progress Notes (Signed)
 Established Patient Office Visit  Subjective:   Patient ID: Erin Dorsey, female    DOB: 03-04-1971  Age: 53 y.o. MRN: [id number]  CC:  Chief Complaint  Patient presents with   Medical Management of Chronic Issues    Reports having issues with shakes. She is having attacks to where her body will jerk. Pt has also needs an order a rolling walker with a seat attached.    HPI: Erin Dorsey is a 53 y.o. female presenting on 05/07/2023 for Medical Management of Chronic Issues (Reports having issues with shakes. She is having attacks to where her body will jerk. Pt has also needs an order a rolling walker with a seat attached.)  Here with complaint of repeat falling. She states she will feel a nerve pain that goes from her right lower back to her right lower leg. She states her whole body will jerk from the pain and cause her to fall. She has been falling for some time but she states the 'jerking/jumping' part will cause her to fall, and she will feel weakness right before falling in her right lower leg. Partner states at times she will just be standing and then jerk and then will fall. She states the jerking and falling has started and become worse in the last two weeks. She states she has been having unstable walking and feels unbalanced when doing so. No known history of seizures.   She states she stutters as well, which is new for her. She states she is trying to find a word and can't seem to figure it out.   She is taking gabapentin  1 am in , 1 mid day and three at night time. She is walking with a cane.   Her partner states that she jerks often throughout the night that he states looks like nerve related. He states not just as she is falling asleep, often throughout the night. This has been ongoing for the last four months, she states she noticed it start right after her cholecystectomy.  B12 def: she is still taking 1000 mcg once daily over the counter and a MVI.   She has  been on amitriptyline  for years, not new.        ROS: Negative unless specifically indicated above in HPI.   Relevant past medical history reviewed and updated as indicated.   Allergies and medications reviewed and updated.   Current Outpatient Medications:    amitriptyline  (ELAVIL ) 25 MG tablet, Take 1 tablet (25 mg total) by mouth at bedtime., Disp: 90 tablet, Rfl: 3   baclofen  (LIORESAL ) 10 MG tablet, Take 0.5 tablets (5 mg total) by mouth 3 (three) times daily as needed for muscle spasms., Disp: 30 tablet, Rfl: 0   busPIRone  (BUSPAR ) 15 MG tablet, Take 1 tablet (15 mg total) by mouth 3 (three) times daily., Disp: 270 tablet, Rfl: 4   cetirizine  (ZYRTEC ) 10 MG tablet, Take 1 tablet (10 mg total) by mouth daily., Disp: 90 tablet, Rfl: 3   clotrimazole -betamethasone  (LOTRISONE ) cream, APPLY 1 APPLICATION TOPICALLY DAILY, Disp: 15 g, Rfl: 1   FLUoxetine  HCl 60 MG TABS, Take 60 mg by mouth daily., Disp: 90 tablet, Rfl: 3   fluticasone  (FLOVENT  HFA) 110 MCG/ACT inhaler, Inhale 1 puff into the lungs in the morning and at bedtime., Disp: 1 each, Rfl: 12   gabapentin  (NEURONTIN ) 300 MG capsule, TAKE 1 CAP BY MOUTH 3 TIMES DAILY AS DIRECTED,1 CAPSULE IN AM, 1 CAPSULE AT NOON, THEN 3 CAPSULES  NIGHTLY, Disp: 450 capsule, Rfl: 2   Hydrocortisone  Acetate 1 % CREA, Apply 1 application  topically daily as needed (heat rash)., Disp: 15 g, Rfl: 0   hydrOXYzine  (ATARAX ) 25 MG tablet, TAKE 1 TABLET BY MOUTH EVERYDAY AT BEDTIME, Disp: 90 tablet, Rfl: 2   mupirocin  ointment (BACTROBAN ) 2 %, Apply topically daily., Disp: 22 g, Rfl: 0   nystatin  (MYCOSTATIN /NYSTOP ) powder, APPLY TO AFFECTED AREA 3 TIMES A DAY, Disp: 15 g, Rfl: 5   omeprazole  (PRILOSEC) 20 MG capsule, TAKE 1 CAPSULE BY MOUTH EVERY DAY, Disp: 90 capsule, Rfl: 1   ondansetron  (ZOFRAN ) 4 MG tablet, TAKE 1 TABLET BY MOUTH EVERY 8 HOURS AS NEEDED FOR NAUSEA AND VOMITING, Disp: 20 tablet, Rfl: 0   pravastatin (PRAVACHOL) 10 MG tablet, Take 10 mg by  mouth at bedtime., Disp: , Rfl:    promethazine  (PHENERGAN ) 25 MG tablet, Take 25 mg by mouth every 6 (six) hours as needed for nausea or vomiting., Disp: , Rfl:    SUBOXONE 8-2 MG FILM, Take 1 Film by mouth 3 (three) times daily., Disp: , Rfl:    valACYclovir  (VALTREX ) 500 MG tablet, TAKE 2 TABLETS BY MOUTH EVERY DAY, Disp: 60 tablet, Rfl: 2   VENTOLIN  HFA 108 (90 Base) MCG/ACT inhaler, TAKE 2 PUFFS BY MOUTH EVERY 6 HOURS AS NEEDED FOR WHEEZE OR SHORTNESS OF BREATH, Disp: 18 each, Rfl: 2  Allergies  Allergen Reactions   Amoxicillin Rash   Penicillins Rash    Objective:   BP 126/80 (BP Location: Left Arm, Patient Position: Sitting, Cuff Size: Large)   Pulse (!) 101   Temp 98.2 F (36.8 C) (Temporal)   Ht 5' 8 (1.727 m)   Wt 197 lb 6.4 oz (89.5 kg)   LMP 06/23/2015   SpO2 99%   BMI 30.01 kg/m    Physical Exam Constitutional:      General: She is not in acute distress.    Appearance: Normal appearance. She is normal weight. She is not ill-appearing, toxic-appearing or diaphoretic.  HENT:     Head: Normocephalic.  Cardiovascular:     Rate and Rhythm: Normal rate and regular rhythm.  Pulmonary:     Effort: Pulmonary effort is normal.     Breath sounds: Normal breath sounds.  Musculoskeletal:        General: Normal range of motion.  Neurological:     General: No focal deficit present.     Mental Status: She is alert and oriented to person, place, and time. Mental status is at baseline.     Cranial Nerves: Cranial nerves 2-12 are intact. No cranial nerve deficit or facial asymmetry.     Sensory: Sensation is intact.     Motor: Weakness (right lower ext) and pronator drift present. No tremor.     Coordination: Romberg sign positive. Heel to [facility] Test abnormal. Finger-Nose-Finger Test normal. Rapid alternating movements normal.     Gait: Gait abnormal (unsteady).     Comments: Stuttering at times when she talks  Psychiatric:        Mood and Affect: Mood normal.         Behavior: Behavior normal.        Thought Content: Thought content normal.        Judgment: Judgment normal.     Assessment & Plan:  Ataxia Assessment & Plan: Newer onset.  Discussed increased risk for falls.  Mri w /wo  Referral to neurology  Order placed for rolling walker   Orders: -  Ambulatory referral to Neurology -     TSH -     MR BRAIN W WO CONTRAST; Future -     For home use only DME 4 wheeled rolling walker with seat  Chronic right-sided low back pain with right-sided sciatica  Expressive aphasia Assessment & Plan: Newer onset.  Stat mri w wo R/o stroke demyelinating disorder and or neoplasm. Referral placed as well for neurology   Orders: -     Ambulatory referral to Neurology -     MR BRAIN W WO CONTRAST; Future  Spasticity Assessment & Plan: Newer onset, worsening.  Stop flexeril .  Start trial baclofen , start low at 5 mg at bedtime.  Can up to tid however for right now hesitant to do so due to frequent falls.  Did advise her this may increase drowsiness so caution with rising and walking around.   Orders: -     Baclofen ; Take 0.5 tablets (5 mg total) by mouth 3 (three) times daily as needed for muscle spasms.  Dispense: 30 tablet; Refill: 0 -     Ambulatory referral to Neurology -     TSH -     Comprehensive metabolic panel -     MR BRAIN W WO CONTRAST; Future  Vitamin B12 deficiency -     Vitamin B12 -     CBC  Hypokalemia -     Comprehensive metabolic panel  Frequent falls Assessment & Plan: Worsening.  Discussed safety precautions at home to decrease fall risk  Orders: -     MR BRAIN W WO CONTRAST; Future -     For home use only DME 4 wheeled rolling walker with seat  Other fatigue -     TSH -     Comprehensive metabolic panel  Obesity (BMI 30.0-34.9) -     TSH  Weakness of right lower extremity -     For home use only DME 4 wheeled rolling walker with seat     Follow up plan: Return in about 3 weeks (around 05/28/2023)  for for ataxia.  Ginger Patrick, FNP

## 2023-05-07 NOTE — Patient Instructions (Signed)
 Stop flexeril  Try starting baclofen   A referral was placed today for neurology  Please let us know if you have not heard back within 2 weeks about the referral.

## 2023-05-07 NOTE — Assessment & Plan Note (Signed)
 Worsening.  Discussed safety precautions at home to decrease fall risk

## 2023-05-07 NOTE — Assessment & Plan Note (Signed)
 Newer onset.  Stat mri w wo R/o stroke demyelinating disorder and or neoplasm. Referral placed as well for neurology

## 2023-05-07 NOTE — Telephone Encounter (Signed)
 Yup! Put in DME order.

## 2023-05-07 NOTE — Assessment & Plan Note (Signed)
 Newer onset, worsening.  Stop flexeril .  Start trial baclofen , start low at 5 mg at bedtime.  Can up to tid however for right now hesitant to do so due to frequent falls.  Did advise her this may increase drowsiness so caution with rising and walking around.

## 2023-05-07 NOTE — Addendum Note (Signed)
 Addended by: Alvina Chou on: 05/07/2023 01:47 PM   Modules accepted: Orders

## 2023-05-07 NOTE — Telephone Encounter (Signed)
 Pt was seen today in office. Tabitha ordered a rolling walker for the pt, this order has been faxed to [facility] [phone]) in [place]. Fax confirmation was received at 1400.

## 2023-05-09 ENCOUNTER — Encounter: Payer: Self-pay | Admitting: Family

## 2023-05-09 DIAGNOSIS — R270 Ataxia, unspecified: Secondary | ICD-10-CM

## 2023-05-09 DIAGNOSIS — R296 Repeated falls: Secondary | ICD-10-CM

## 2023-05-09 DIAGNOSIS — R252 Cramp and spasm: Secondary | ICD-10-CM

## 2023-05-14 ENCOUNTER — Other Ambulatory Visit: Payer: Self-pay | Admitting: Family

## 2023-05-14 NOTE — Telephone Encounter (Signed)
 Can we please change her neuro referral to urgent? Symptoms worsening quite rapidly. Increasing falls and jerking sensations.

## 2023-05-14 NOTE — Telephone Encounter (Signed)
 Would you like to place a Referral to Home Health for Nursing services?

## 2023-05-14 NOTE — Telephone Encounter (Signed)
 Yes that is a good idea will place referral for this as well.  Are we still able to change neurology referral to urgent?

## 2023-05-17 ENCOUNTER — Ambulatory Visit
Admission: RE | Admit: 2023-05-17 | Discharge: 2023-05-17 | Disposition: A | Discharge status: Home / Self Care | Payer: Medicaid Other | Source: Ambulatory Visit | Attending: Neurology | Admitting: Neurology

## 2023-05-17 ENCOUNTER — Encounter: Payer: Self-pay | Admitting: Neurology

## 2023-05-17 ENCOUNTER — Ambulatory Visit: Payer: Medicaid Other | Admitting: Neurology

## 2023-05-17 VITALS — BP 120/70 | HR 78 | Ht 68.0 in | Wt 198.0 lb

## 2023-05-17 DIAGNOSIS — R253 Fasciculation: Secondary | ICD-10-CM | POA: Insufficient documentation

## 2023-05-17 DIAGNOSIS — F8081 Childhood onset fluency disorder: Secondary | ICD-10-CM

## 2023-05-17 NOTE — Progress Notes (Signed)
Chief Complaint  Patient presents with   New Patient (Initial Visit)    Pt in 15, here with husband Erin Dorsey Pt is referred for expressive aphasia, spasticity of body and frequent falls. Pt states her body is rigid at times, and states her whole body jerks when she is laying down. Pt states some of her symptoms started after she had gallbladder since August/2024.    ASSESSMENT AND PLAN  Erin Dorsey is a 53 y.o. female   Intermittent stuttering of the speech, unsteady gait, body jerking movement  Examination is limited by joint pain, there was no significant abnormality noted,  MRI of the brain in January 2025 showed no significant abnormality  EEG  Laboratory evaluation to rule out metabolic disarrangement, inflammatory  DIAGNOSTIC DATA (LABS, IMAGING, TESTING) - I reviewed patient records, labs, notes, testing and imaging myself where available.   MEDICAL HISTORY:  Erin Dorsey is a 53 year old female, seen in request by [facility] NP Sherrilee Gilles, [place], for evaluation of difficulty talking, intermittent body jerking movement, accompanied by her husband at today's visit on May 17, 2023.  History is obtained from the patient and review of electronic medical records. I personally reviewed pertinent available imaging films in PACS.   PMHx of  Cervical cancer s/p hysterectomy Anxiety, Depression HLD Genital herpes.  She has not worked for many years, used to work at [employer] many years ago, not on disability,  Since summer 2024, she began to notice intermittent stuttering of the speech, often brought on by stress, can lasting for few minutes,  Husband noticed she has body jerking movement during sleep, then began to happen during the day, quick body jerking movement lasting for few minutes, many episode in the day,  She complains of mild gait abnormality, which is also limited by her low back pain, right hip pain, worsening right knee brace, began to use  walker few days ago  Laboratory evaluation in September 2024 B12 was 322, BMP with mild decreased calcium 8.3, normal CBC  MRI of the brain January 2025: No acute abnormality, mild small vessel disease  PHYSICAL EXAM:   Vitals:   05/17/23 1036  BP: 120/70  Pulse: 78  Weight: 198 lb (89.8 kg)  Height: 5\' 8"  (1.727 m)   Body mass index is 30.11 kg/m.  PHYSICAL EXAMNIATION:  Gen: NAD, conversant, well nourised, well groomed                     Cardiovascular: Regular rate rhythm, no peripheral edema, warm, nontender. Eyes: Conjunctivae clear without exudates or hemorrhage Neck: Supple, no carotid bruits. Pulmonary: Clear to auscultation bilaterally   NEUROLOGICAL EXAM:  MENTAL STATUS: Speech/cognition: Depressed looking middle-age female, awake, alert, oriented to history taking and casual conversation CRANIAL NERVES: CN II: Visual fields are full to confrontation. Pupils are round equal and briskly reactive to light. CN III, IV, VI: extraocular movement are normal. No ptosis. CN V: Facial sensation is intact to light touch CN VII: Face is symmetric with normal eye closure  CN VIII: Hearing is normal to causal conversation. CN IX, X: Phonation is normal. CN XI: Head turning and shoulder shrug are intact  MOTOR: There is no pronator drift of out-stretched arms. Muscle bulk and tone are normal. Muscle strength is normal.  REFLEXES: Reflexes are 2+ and symmetric at the biceps, triceps, knees, and ankles. Plantar responses are flexor.  SENSORY: Intact to light touch, pinprick and vibratory sensation are intact in fingers and  toes.  COORDINATION: There is no trunk or limb dysmetria noted.  GAIT/STANCE: Rely on her walker, push-up to get up from seated position, mildly antalgic   REVIEW OF SYSTEMS:  Full 14 system review of systems performed and notable only for as above All other review of systems were negative.   ALLERGIES: Allergies  Allergen Reactions    Amoxicillin Rash   Penicillins Rash    HOME MEDICATIONS: Current Outpatient Medications  Medication Sig Dispense Refill   amitriptyline (ELAVIL) 25 MG tablet Take 1 tablet (25 mg total) by mouth at bedtime. 90 tablet 3   baclofen (LIORESAL) 10 MG tablet Take 0.5 tablets (5 mg total) by mouth 3 (three) times daily as needed for muscle spasms. 30 tablet 0   busPIRone (BUSPAR) 15 MG tablet Take 1 tablet (15 mg total) by mouth 3 (three) times daily. 270 tablet 4   cetirizine (ZYRTEC) 10 MG tablet Take 1 tablet (10 mg total) by mouth daily. 90 tablet 3   clotrimazole-betamethasone (LOTRISONE) cream APPLY 1 APPLICATION TOPICALLY DAILY 15 g 1   FLUoxetine HCl 60 MG TABS Take 60 mg by mouth daily. 90 tablet 3   fluticasone (FLOVENT HFA) 110 MCG/ACT inhaler Inhale 1 puff into the lungs in the morning and at bedtime. 1 each 12   gabapentin (NEURONTIN) 300 MG capsule TAKE 1 CAP BY MOUTH 3 TIMES DAILY AS DIRECTED,1 CAPSULE IN AM, 1 CAPSULE AT NOON, THEN 3 CAPSULES NIGHTLY 450 capsule 2   gabapentin (NEURONTIN) 400 MG capsule Take 400 mg by mouth at bedtime.     Hydrocortisone Acetate 1 % CREA Apply 1 application  topically daily as needed (heat rash). 15 g 0   hydrOXYzine (ATARAX) 25 MG tablet TAKE 1 TABLET BY MOUTH EVERYDAY AT BEDTIME 90 tablet 2   mupirocin ointment (BACTROBAN) 2 % Apply topically daily. 22 g 0   nystatin (MYCOSTATIN/NYSTOP) powder APPLY TO AFFECTED AREA 3 TIMES A DAY 15 g 5   omeprazole (PRILOSEC) 20 MG capsule TAKE 1 CAPSULE BY MOUTH EVERY DAY 90 capsule 1   ondansetron (ZOFRAN) 4 MG tablet TAKE 1 TABLET BY MOUTH EVERY 8 HOURS AS NEEDED FOR NAUSEA AND VOMITING 20 tablet 0   pravastatin (PRAVACHOL) 10 MG tablet Take 10 mg by mouth at bedtime.     promethazine (PHENERGAN) 25 MG tablet Take 25 mg by mouth every 6 (six) hours as needed for nausea or vomiting.     SUBOXONE 8-2 MG FILM Take 1 Film by mouth 3 (three) times daily.     valACYclovir (VALTREX) 500 MG tablet TAKE 2 TABLETS BY  MOUTH EVERY DAY 60 tablet 2   VENTOLIN HFA 108 (90 Base) MCG/ACT inhaler TAKE 2 PUFFS BY MOUTH EVERY 6 HOURS AS NEEDED FOR WHEEZE OR SHORTNESS OF BREATH 18 each 2   No current facility-administered medications for this visit.    PAST MEDICAL HISTORY: Past Medical History:  Diagnosis Date   Allergy    Anxiety    Cervical cancer (HCC) 2011   stage 3   Chronic headaches    Depression    Fatty liver    Frequent falls    GERD (gastroesophageal reflux disease)    Lumbar radiculopathy     PAST SURGICAL HISTORY: Past Surgical History:  Procedure Laterality Date   ABDOMINAL HYSTERECTOMY     Total   ANTERIOR AND POSTERIOR REPAIR N/A 04/12/2020   Procedure: ANTERIOR (CYSTOCELE) AND POSTERIOR REPAIR (RECTOCELE);  Surgeon: Linzie Collin, MD;  Location: [facility] OR[facility];  Service:  Gynecology;  Laterality: N/A;   BLADDER SUSPENSION N/A 04/12/2020   Procedure: [facility] PROCEDURE;  Surgeon: Linzie Collin, MD;  Location: [facility];  Service: Gynecology;  Laterality: N/A;  TOT   CHOLECYSTECTOMY N/A 11/30/2022   Procedure: LAPAROSCOPIC CHOLECYSTECTOMY;  Surgeon: Fritzi Mandes, MD;  Location: [facility];  Service: General;  Laterality: N/A;   COLONOSCOPY WITH PROPOFOL N/A 01/26/2021   Procedure: COLONOSCOPY WITH PROPOFOL;  Surgeon: Earline Mayotte, MD;  Location: [facility];  Service: Endoscopy;  Laterality: N/A;   COLPOSCOPY  2011 or 2012   stage 3-removed cancerous spot on the cervix   ESOPHAGOGASTRODUODENOSCOPY (EGD) WITH PROPOFOL N/A 01/26/2021   Procedure: ESOPHAGOGASTRODUODENOSCOPY (EGD) WITH PROPOFOL;  Surgeon: Earline Mayotte, MD;  Location: [facility];  Service: Endoscopy;  Laterality: N/A;   EXCISION OF TONGUE LESION N/A 10/15/2019   Procedure: EXCISION OF TONGUE LESION;  Surgeon: Geanie Logan, MD;  Location: [facility];  Service: ENT;  Laterality: N/A;   FOOT SURGERY Right    x 2-took knots out from bottom of the foot-not sure of the technical name   LAPAROSCOPIC VAGINAL  HYSTERECTOMY WITH SALPINGO OOPHORECTOMY Bilateral 04/12/2020   Procedure: LAPAROSCOPIC ASSISTED VAGINAL HYSTERECTOMY WITH SALPINGO OOPHORECTOMY;  Surgeon: Linzie Collin, MD;  Location: [facility];  Service: Gynecology;  Laterality: Bilateral;   PAROTIDECTOMY Right 10/15/2019   Procedure: PAROTIDECTOMY;  Surgeon: Geanie Logan, MD;  Location: [facility];  Service: ENT;  Laterality: Right;   TUBAL LIGATION      FAMILY HISTORY: Family History  Problem Relation Age of Onset   Hypertension Mother    Kidney disease Father        was on dialysis   Uterine cancer Sister    Cervical cancer Sister    Deafness Daughter    Cervical cancer Maternal Grandmother    Lung cancer Maternal Grandfather    Brain cancer Maternal Grandfather    Thyroid disease Daughter    Breast cancer Paternal Aunt     SOCIAL HISTORY: Social History   Socioeconomic History   Marital status: Married    Spouse name: Erin Dorsey   Number of children: 3   Years of education: 7th grade   Highest education level: Not on file  Occupational History    Employer: Not Employed  Tobacco Use   Smoking status: Former    Current packs/day: 0.00    Average packs/day: 0.3 packs/day for 20.0 years (5.0 ttl pk-yrs)    Types: Cigarettes    Start date: 09/01/1999    Quit date: 09/01/2019    Years since quitting: 3.7   Smokeless tobacco: Never   Tobacco comments:    not daily  Vaping Use   Vaping status: Never Used  Substance and Sexual Activity   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   Drug use: Yes    Frequency: 1.0 times per week    Types: Marijuana    Comment: as needed   Sexual activity: Yes    Partners: Male    Birth control/protection: Surgical    Comment: tubal ligation  Other Topics Concern   Not on file  Social History Narrative   08/06/19   From: [place], came here to be close to daughter   Living: with husband Erin Dorsey together since 2011 and married since 2021   Work: not currently, use to work at  [employer]      Family: 3 daughter - in [place] - ok relationship - 1 grandchild / with Erin Dorsey has 18  step grandchildren      Enjoys: walk, watch movies, painting, cross stitch      Exercise: walking   Diet: eats whatever      Safety   Seat belts: Yes    Guns: No   Safe in relationships: Yes    Social Drivers of Corporate investment banker Strain: Not on file  Food Insecurity: No Food Insecurity (10/06/2022)   Hunger Vital Sign    Worried About Running Out of Food in the Last Year: Never true    Ran Out of Food in the Last Year: Never true  Transportation Needs: No Transportation Needs (10/06/2022)   PRAPARE - Administrator, Civil Service (Medical): No    Lack of Transportation (Non-Medical): No  Physical Activity: Not on file  Stress: Not on file  Social Connections: Not on file  Intimate Partner Violence: Not on file      Levert Feinstein, M.D. Ph.D.  [facility] [address] [place], [place] [address] Ph: [phone] Fax: [phone]  CC:  Mort Sawyers, FNP [address] [place],  [place] [address]  Mort Sawyers, FNP

## 2023-05-18 NOTE — Progress Notes (Signed)
Noted. To be reviewed by neurology.

## 2023-05-19 LAB — COMPREHENSIVE METABOLIC PANEL
ALT: 23 [IU]/L (ref 0–32)
AST: 24 [IU]/L (ref 0–40)
Albumin: 4.1 g/dL (ref 3.9–4.9)
Alkaline Phosphatase: 57 [IU]/L (ref 44–121)
BUN/Creatinine Ratio: 15 (ref 9–23)
BUN: 10 mg/dL (ref 6–24)
Bilirubin Total: 0.4 mg/dL (ref 0.0–1.2)
CO2: 27 mmol/L (ref 20–29)
Calcium: 9.3 mg/dL (ref 8.7–10.2)
Chloride: 102 mmol/L (ref 96–106)
Creatinine, Ser: 0.66 mg/dL (ref 0.57–1.00)
Globulin, Total: 2.6 g/dL (ref 1.5–4.5)
Glucose: 83 mg/dL (ref 70–99)
Potassium: 4.2 mmol/L (ref 3.5–5.2)
Sodium: 142 mmol/L (ref 134–144)
Total Protein: 6.7 g/dL (ref 6.0–8.5)
eGFR: 107 mL/min/{1.73_m2} (ref >59)

## 2023-05-19 LAB — ANA W/REFLEX IF POSITIVE
Anti Nuclear Antibody (ANA): POSITIVE — AB
Chromatin Ab SerPl-aCnc: 0.8 AI (ref 0.0–0.9)

## 2023-05-19 LAB — PTH, INTACT AND CALCIUM: PTH: 33 pg/mL (ref 15–65)

## 2023-05-19 LAB — VITAMIN D 25 HYDROXY (VIT D DEFICIENCY, FRACTURES): Vit D, 25-Hydroxy: 31.3 ng/mL (ref 30.0–100.0)

## 2023-05-19 LAB — CK: Total CK: 66 U/L (ref 32–182)

## 2023-05-19 LAB — RPR: RPR Ser Ql: NONREACTIVE

## 2023-05-19 LAB — SEDIMENTATION RATE: Sed Rate: 11 mm/h (ref 0–40)

## 2023-05-19 LAB — C-REACTIVE PROTEIN: CRP: 10 mg/L (ref 0–10)

## 2023-05-19 LAB — TSH: TSH: 3.86 u[IU]/mL (ref 0.450–4.500)

## 2023-05-19 LAB — HIV ANTIBODY (ROUTINE TESTING W REFLEX): HIV Screen 4th Generation wRfx: NONREACTIVE

## 2023-05-19 LAB — VITAMIN B12: Vitamin B-12: 749 pg/mL (ref 232–1245)

## 2023-05-21 ENCOUNTER — Telehealth: Payer: Self-pay | Admitting: Neurology

## 2023-05-21 ENCOUNTER — Other Ambulatory Visit: Payer: Self-pay | Admitting: Family

## 2023-05-21 DIAGNOSIS — R252 Cramp and spasm: Secondary | ICD-10-CM

## 2023-05-21 NOTE — Telephone Encounter (Signed)
Pt has called back to schedule her EEG, preporation for the test was gone over with pt.

## 2023-05-22 ENCOUNTER — Encounter: Payer: Self-pay | Admitting: Neurology

## 2023-05-22 ENCOUNTER — Ambulatory Visit: Payer: Medicaid Other | Admitting: Neurology

## 2023-05-22 DIAGNOSIS — R253 Fasciculation: Secondary | ICD-10-CM | POA: Diagnosis not present

## 2023-05-22 DIAGNOSIS — F8081 Childhood onset fluency disorder: Secondary | ICD-10-CM

## 2023-05-22 MED ORDER — BACLOFEN 10 MG PO TABS: 5.0000 mg | ORAL_TABLET | Freq: Three times a day (TID) | ORAL | 0 refills | Status: DC | PRN

## 2023-05-24 ENCOUNTER — Other Ambulatory Visit: Payer: Self-pay | Admitting: Family

## 2023-05-24 DIAGNOSIS — K130 Diseases of lips: Secondary | ICD-10-CM

## 2023-05-24 MED ORDER — MUPIROCIN 2 % EX OINT: TOPICAL_OINTMENT | Freq: Every day | CUTANEOUS | 0 refills | Status: DC

## 2023-05-28 ENCOUNTER — Encounter: Payer: Self-pay | Admitting: Family

## 2023-05-30 ENCOUNTER — Other Ambulatory Visit: Payer: Self-pay | Admitting: Family

## 2023-05-30 DIAGNOSIS — J40 Bronchitis, not specified as acute or chronic: Secondary | ICD-10-CM

## 2023-05-31 ENCOUNTER — Encounter: Payer: Self-pay | Admitting: Family

## 2023-06-03 NOTE — Telephone Encounter (Signed)
Erin Dorsey can you tell her what I wrote below? I am guessing maybe she didn't see my explantation?   No lesions or masses seen on MRI which is reasuring.  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   Microvascular ischemic disease is a brain condition that commonly affects older people. Untreated, it can lead to dementia, stroke and difficulty walking. Treatment typically involves reducing or managing risk factors, such as high blood pressure, cholesterol level, diabetes and smoking. Do I think that all of these symptoms are related, not necessarily, and I do want her to follow with neurology as I referred.    With this, we will focus heavily on meeting these goal levels on cholesterol etc

## 2023-06-03 NOTE — Telephone Encounter (Signed)
I think this is what she was referring to. Disregard other message.

## 2023-06-06 ENCOUNTER — Encounter: Payer: Self-pay | Admitting: Neurology

## 2023-06-06 ENCOUNTER — Ambulatory Visit: Payer: Medicaid Other | Admitting: Neurology

## 2023-06-06 VITALS — BP 93/61 | HR 70 | Ht 68.5 in | Wt 199.8 lb

## 2023-06-06 DIAGNOSIS — R0681 Apnea, not elsewhere classified: Secondary | ICD-10-CM | POA: Diagnosis not present

## 2023-06-06 DIAGNOSIS — E663 Overweight: Secondary | ICD-10-CM

## 2023-06-06 DIAGNOSIS — R0683 Snoring: Secondary | ICD-10-CM | POA: Diagnosis not present

## 2023-06-06 DIAGNOSIS — Z9189 Other specified personal risk factors, not elsewhere classified: Secondary | ICD-10-CM | POA: Diagnosis not present

## 2023-06-06 DIAGNOSIS — G253 Myoclonus: Secondary | ICD-10-CM

## 2023-06-06 DIAGNOSIS — G4719 Other hypersomnia: Secondary | ICD-10-CM | POA: Diagnosis not present

## 2023-06-06 NOTE — Progress Notes (Signed)
 Subjective:    Patient ID: Erin Dorsey is a 53 y.o. female.  HPI    True Mar, MD, PhD [facility] [address], [address] [address] [place], [place] [address]  Dear Ginger,  I saw your patient, Erin Dorsey, upon your kind request in my neurologic clinic today for initial consultation of her sleep disorder, in particular, concern for underlying obstructive sleep apnea.  The patient is accompanied by her husband today. As you know, Erin Dorsey is a 53 year old female with an underlying medical history of low back pain, reflux disease, allergies, anxiety, depression, fatty liver disease, cervical cancer, status post hysterectomy, status post bladder suspension surgery, status post parotidectomy, status post cholecystectomy and overweight state, who reports snoring and excessive daytime somnolence as well as witnessed apneas per husband's observation.  He also reports that she jerks in her sleep.  Her Epworth sleepiness score is 22 out of 24, fatigue severity score is 56 out of 63.  She is not aware of any family history of sleep apnea.  She goes to bed around 11 and rise time is between 8 and 9 AM.  She has nocturia about 3 times per average night and reports frequent morning headaches.  She quit smoking in 2021.  She lives with her husband, they have 1 outside cat and 1 outside dog.  They have grown children.  She does have a TV on in her bedroom and it tends to stay on all night.  I reviewed your office note notes from 01/15/2023 as well as 05/07/2023.  She was recently evaluated for recurrent falls, intermittent jerking and new onset stuttering by my colleague, Dr. Onita on 05/17/2023 and I reviewed the office visit note as well.  She had blood work on 05/17/2023 which I reviewed in her electronic chart.  She had a brain MRI with and without contrast on 05/07/2023 and I reviewed the results:   IMPRESSION: 1. No acute intracranial abnormality. 2. Mild cerebral white  matter disease, nonspecific, but most commonly related to chronic microvascular ischemic disease.  Of note, she is on multiple medications including psychotropic medications.  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 Her Past Medical History Is Significant For: Past Medical History:  Diagnosis Date   Allergy    Anxiety    Cervical cancer (HCC) 2011   stage 3   Chronic headaches    Depression    Fatty liver    Frequent falls    GERD (gastroesophageal reflux disease)    Lumbar radiculopathy     Her Past Surgical History Is Significant For: Past Surgical History:  Procedure Laterality Date   ABDOMINAL HYSTERECTOMY     Total   ANTERIOR AND POSTERIOR REPAIR N/A 04/12/2020   Procedure: ANTERIOR (CYSTOCELE) AND POSTERIOR REPAIR (RECTOCELE);  Surgeon: Janit Alm Agent, MD;  Location: [facility] ORS;  Service: Gynecology;  Laterality: N/A;   BLADDER SUSPENSION N/A 04/12/2020   Procedure: [facility] PROCEDURE;  Surgeon: Janit Alm Agent, MD;  Location: [facility];  Service: Gynecology;  Laterality: N/A;  TOT   CHOLECYSTECTOMY N/A 11/30/2022   Procedure: LAPAROSCOPIC CHOLECYSTECTOMY;  Surgeon: Dasie Leonor CROME, MD;  Location[facility];  Service: General;  Laterality: N/A;   COLONOSCOPY WITH PROPOFOL  N/A 01/26/2021   Procedure: COLONOSCOPY WITH PROPOFOL ;  Surgeon: Dessa Reyes ORN, MD;  Location: [facility];  Service: Endoscopy;  Laterality: N/A;   COLPOSCOPY  2011 or 2012   stage  3-removed cancerous spot on the cervix   ESOPHAGOGASTRODUODENOSCOPY (EGD) WITH PROPOFOL  N/A 01/26/2021   Procedure: ESOPHAGOGASTRODUODENOSCOPY (EGD) WITH PROPOFOL ;  Surgeon: Dessa Reyes ORN, MD;  Location: [facility];  Service: Endoscopy;  Laterality: N/A;   EXCISION OF TONGUE LESION N/A 10/15/2019   Procedure: EXCISION OF TONGUE LESION;  Surgeon: Blair Mt, MD;  Location: [facility] OR[facility];  Service: ENT;  Laterality: N/A;   FOOT SURGERY Right     x 2-took knots out from bottom of the foot-not sure of the technical name   LAPAROSCOPIC VAGINAL HYSTERECTOMY WITH SALPINGO OOPHORECTOMY Bilateral 04/12/2020   Procedure: LAPAROSCOPIC ASSISTED VAGINAL HYSTERECTOMY WITH SALPINGO OOPHORECTOMY;  Surgeon: Janit Alm Agent, MD;  Location: [facility];  Service: Gynecology;  Laterality: Bilateral;   PAROTIDECTOMY Right 10/15/2019   Procedure: PAROTIDECTOMY;  Surgeon: Blair Mt, MD;  Location: [facility] OR[facility];  Service: ENT;  Laterality: Right;   TUBAL LIGATION      Her Family History Is Significant For: Family History  Problem Relation Age of Onset   Hypertension Mother    Kidney disease Father        was on dialysis   Uterine cancer Sister    Cervical cancer Sister    Breast cancer Paternal Aunt    Cervical cancer Maternal Grandmother    Lung cancer Maternal Grandfather    Brain cancer Maternal Grandfather    Deafness Daughter    Thyroid  disease Daughter     Her Social History Is Significant For: Social History   Socioeconomic History   Marital status: Married    Spouse name: [name]   Number of children: 3   Years of education: 7th grade   Highest education level: Not on file  Occupational History    Employer: Not Employed  Tobacco Use   Smoking status: Former    Current packs/day: 0.00    Average packs/day: 0.3 packs/day for 20.0 years (5.0 ttl pk-yrs)    Types: Cigarettes    Start date: 09/01/1999    Quit date: 09/01/2019    Years since quitting: 3.7   Smokeless tobacco: Never   Tobacco comments:    not daily  Vaping Use   Vaping status: Never Used  Substance and Sexual Activity   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   Drug use: Yes    Frequency: 1.0 times per week    Types: Marijuana    Comment: as needed   Sexual activity: Yes    Partners: Male    Birth control/protection: Surgical    Comment: tubal ligation  Other Topics Concern   Not on file  Social History Narrative   08/06/19   From: [place] , came  here to be close to daughter   Living: with husband [name] together since 2011 and married since 2021   Work: not currently, use to work at [employer]      Family: 9 children  - in [place]  - ok relationship - 1 grandchild / with curtistine has 21 step grandchildren      Enjoys: walk, watch movies, painting, cross stitch      Exercise: walking   Diet: eats whatever      Safety   Seat belts: Yes    Guns: No   Safe in relationships: Yes    Caffiene: sprite, no caffiene      Social Drivers of Corporate Investment Banker Strain: Not on file  Food Insecurity: No Food Insecurity (10/06/2022)   Hunger Vital Sign    Worried About  Running Out of Food in the Last Year: Never true    Ran Out of Food in the Last Year: Never true  Transportation Needs: No Transportation Needs (10/06/2022)   PRAPARE - Administrator, Civil Service (Medical): No    Lack of Transportation (Non-Medical): No  Physical Activity: Not on file  Stress: Not on file  Social Connections: Not on file    Her Allergies Are:  Allergies  Allergen Reactions   Amoxicillin Rash   Penicillins Rash  :   Her Current Medications Are:  Outpatient Encounter Medications as of 06/06/2023  Medication Sig   amitriptyline  (ELAVIL ) 25 MG tablet Take 1 tablet (25 mg total) by mouth at bedtime.   baclofen  (LIORESAL ) 10 MG tablet Take 0.5 tablets (5 mg total) by mouth 3 (three) times daily as needed for muscle spasms.   busPIRone  (BUSPAR ) 15 MG tablet Take 1 tablet (15 mg total) by mouth 3 (three) times daily.   cetirizine  (ZYRTEC ) 10 MG tablet Take 1 tablet (10 mg total) by mouth daily.   clotrimazole -betamethasone  (LOTRISONE ) cream APPLY 1 APPLICATION TOPICALLY DAILY   FLUoxetine  HCl 60 MG TABS Take 60 mg by mouth daily.   fluticasone  (FLOVENT  HFA) 110 MCG/ACT inhaler Inhale 1 puff into the lungs in the morning and at bedtime.   gabapentin  (NEURONTIN ) 400 MG capsule Take 400 mg by mouth at bedtime.   Hydrocortisone  Acetate 1  % CREA Apply 1 application  topically daily as needed (heat rash).   hydrOXYzine  (ATARAX ) 25 MG tablet TAKE 1 TABLET BY MOUTH EVERYDAY AT BEDTIME   mupirocin  ointment (BACTROBAN ) 2 % Apply topically daily.   nystatin  (MYCOSTATIN /NYSTOP ) powder APPLY TO AFFECTED AREA 3 TIMES A DAY   omeprazole  (PRILOSEC) 20 MG capsule TAKE 1 CAPSULE BY MOUTH EVERY DAY   ondansetron  (ZOFRAN ) 4 MG tablet TAKE 1 TABLET BY MOUTH EVERY 8 HOURS AS NEEDED FOR NAUSEA AND VOMITING   pravastatin (PRAVACHOL) 10 MG tablet Take 10 mg by mouth at bedtime.   promethazine  (PHENERGAN ) 25 MG tablet Take 25 mg by mouth every 6 (six) hours as needed for nausea or vomiting.   SUBOXONE 8-2 MG FILM Take 1 Film by mouth 3 (three) times daily.   valACYclovir  (VALTREX ) 500 MG tablet TAKE 2 TABLETS BY MOUTH EVERY DAY   VENTOLIN  HFA 108 (90 Base) MCG/ACT inhaler TAKE 2 PUFFS BY MOUTH EVERY 6 HOURS AS NEEDED FOR WHEEZE OR SHORTNESS OF BREATH   [DISCONTINUED] gabapentin  (NEURONTIN ) 300 MG capsule TAKE 1 CAP BY MOUTH 3 TIMES DAILY AS DIRECTED,1 CAPSULE IN AM, 1 CAPSULE AT NOON, THEN 3 CAPSULES NIGHTLY   No facility-administered encounter medications on file as of 06/06/2023.  :   Review of Systems:  Out of a complete 14 point review of systems, all are reviewed and negative with the exception of these symptoms as listed below:  Review of Systems  Neurological:        Fatigue, nighttime jerking, excessive sleepiness, apnea witnessed by husband. No sleep study.  ESS  22.FSS 56.    Objective:  Neurological Exam  Physical Exam Physical Examination:   Vitals:   06/06/23 0919  BP: 93/61  Pulse: 70  SpO2: 95%    General Examination: The patient is a very pleasant 53 y.o. female in no acute distress. She appears well-developed and well-nourished and adequately groomed.   HEENT: Normocephalic, atraumatic, pupils are equal, round and reactive to light, extraocular tracking is good without limitation to gaze excursion or nystagmus noted.  Hearing is grossly intact. Face is symmetric with normal facial animation. Speech is clear with no dysarthria noted. There is no hypophonia. There is no lip, neck/head, jaw or voice tremor. Neck is supple with full range of passive and active motion. There are no carotid bruits on auscultation. Oropharynx exam reveals: moderate mouth dryness, edentulous.  Mild airway crowding secondary to small airway entry.  Tonsils about 1+ bilaterally.  Tongue protrudes centrally and palate elevates symmetrically, Mallampati class II.  Neck circumference 14-1/4 inches.   Chest: Clear to auscultation without wheezing, rhonchi or crackles noted.  Heart: S1+S2+0, regular and normal without murmurs, rubs or gallops noted.   Abdomen: Soft, non-tender and non-distended.  Extremities: There is nonpitting puffiness in the distal lower extremities bilaterally.   Skin: Warm and dry without trophic changes noted.   Musculoskeletal: exam reveals no obvious joint deformities.   Neurologically:  Mental status: The patient is awake, alert and oriented in all 4 spheres. Her immediate and remote memory, attention, language skills and fund of knowledge are appropriate. There is no evidence of aphasia, agnosia, apraxia or anomia. Speech is clear with normal prosody and enunciation. Thought process is linear. Mood is normal and affect is normal.  Cranial nerves II - XII are as described above under HEENT exam.  Motor exam: Normal bulk, strength and tone is noted. There is no obvious action or resting tremor.  Fine motor skills and coordination: grossly intact.  Cerebellar testing: No dysmetria or intention tremor. There is no truncal or gait ataxia.  Sensory exam: intact to light touch in the upper and lower extremities.  Gait, station and balance: She stands without difficulty, she walks with a rolling walker.  Assessment and Plan:   In summary, Erin Dorsey is a very pleasant 52 y.o.-year old female with an underlying  medical history of low back pain, reflux disease, allergies, anxiety, depression, fatty liver disease, cervical cancer, status post hysterectomy, status post bladder suspension surgery, status post parotidectomy, status post cholecystectomy and overweight state, whose history and physical exam are concerning for sleep disordered breathing, particularly obstructive sleep apnea (OSA).  While a laboratory attended sleep study is typically considered gold standard for evaluation of sleep disordered breathing she prefers to do a home sleep test.   I had a long chat with the patient and her husband about my findings and the diagnosis of sleep apnea, particularly OSA, its prognosis and treatment options. We talked about medical/conservative treatments, surgical interventions and non-pharmacological approaches for symptom control. I explained, in particular, the risks and ramifications of untreated moderate to severe OSA, especially with respect to developing cardiovascular disease down the road, including congestive heart failure (CHF), difficult to treat hypertension, cardiac arrhythmias (particularly A-fib), neurovascular complications including TIA, stroke and dementia. Even type 2 diabetes has, in part, been linked to untreated OSA. Symptoms of untreated OSA may include (but may not be limited to) daytime sleepiness, nocturia (i.e. frequent nighttime urination), memory problems, mood irritability and suboptimally controlled or worsening mood disorder such as depression and/or anxiety, lack of energy, lack of motivation, physical discomfort, as well as recurrent headaches, especially morning or nocturnal headaches. We talked about the importance of maintaining a healthy lifestyle and striving for healthy weight. In addition, we talked about the importance of striving for and maintaining good sleep hygiene. I recommended a sleep study at this time. I outlined the differences between a laboratory attended sleep study  which is considered more comprehensive and accurate over the option of a  home sleep test (HST); the latter may lead to underestimation of sleep disordered breathing in some instances and does not help with diagnosing upper airway resistance syndrome and is not accurate enough to diagnose primary central sleep apnea typically. I outlined possible surgical and non-surgical treatment options of OSA, including the use of a positive airway pressure (PAP) device (i.e. CPAP, AutoPAP/APAP or BiPAP in certain circumstances), a custom-made dental device (aka oral appliance, which would require a referral to a specialist dentist or orthodontist typically, and is generally speaking not considered for patients with full dentures or edentulous state), upper airway surgical options, such as traditional UPPP (which is not considered a first-line treatment) or the Inspire device (hypoglossal nerve stimulator, which would involve a referral for consultation with an ENT surgeon, after careful selection, following inclusion criteria - also not first-line treatment). I explained the PAP treatment option to the patient in detail, as this is generally considered first-line treatment.  The patient indicated that she would be willing to try PAP therapy, if the need arises. I explained the importance of being compliant with PAP treatment, not only for insurance purposes but primarily to improve patient's symptoms symptoms, and for the patient's long term health benefit, including to reduce Her cardiovascular risks longer-term.    We will pick up our discussion about the next steps and treatment options after testing.  We will keep them posted as to the test results by phone call and/or MyChart messaging where possible.  We will plan to follow-up in sleep clinic accordingly as well.  I answered all their questions today and the patient and her husband were in agreement.   I encouraged her to call with any interim questions, concerns,  problems or updates or email us  through MyChart.  Generally speaking, sleep test authorizations may take up to 2 weeks, sometimes less, sometimes longer, the patient is encouraged to get in touch with us  if they do not hear back from the sleep lab staff directly within the next 2 weeks.  Thank you very much for allowing me to participate in the care of this nice patient. If I can be of any further assistance to you please do not hesitate to call me at [phone].  Sincerely,   True Mar, MD, PhD

## 2023-06-08 ENCOUNTER — Telehealth: Payer: Self-pay | Admitting: Neurology

## 2023-06-08 NOTE — Telephone Encounter (Signed)
HST [facility] [employer] pending

## 2023-06-11 ENCOUNTER — Other Ambulatory Visit: Payer: Self-pay | Admitting: Family

## 2023-06-11 DIAGNOSIS — K2100 Gastro-esophageal reflux disease with esophagitis, without bleeding: Secondary | ICD-10-CM

## 2023-06-11 NOTE — Telephone Encounter (Signed)
 HST [facility] [employer] no auth req via fax

## 2023-06-15 ENCOUNTER — Ambulatory Visit: Payer: Medicaid Other | Admitting: Neurology

## 2023-06-15 DIAGNOSIS — R0683 Snoring: Secondary | ICD-10-CM

## 2023-06-15 DIAGNOSIS — G4733 Obstructive sleep apnea (adult) (pediatric): Secondary | ICD-10-CM | POA: Diagnosis not present

## 2023-06-15 DIAGNOSIS — G4719 Other hypersomnia: Secondary | ICD-10-CM

## 2023-06-15 DIAGNOSIS — Z9189 Other specified personal risk factors, not elsewhere classified: Secondary | ICD-10-CM

## 2023-06-15 DIAGNOSIS — G253 Myoclonus: Secondary | ICD-10-CM

## 2023-06-15 DIAGNOSIS — E663 Overweight: Secondary | ICD-10-CM

## 2023-06-15 DIAGNOSIS — R0681 Apnea, not elsewhere classified: Secondary | ICD-10-CM

## 2023-06-19 ENCOUNTER — Ambulatory Visit: Payer: Medicaid Other | Admitting: Family

## 2023-06-21 ENCOUNTER — Telehealth: Payer: Self-pay | Admitting: Neurology

## 2023-06-21 DIAGNOSIS — G253 Myoclonus: Secondary | ICD-10-CM

## 2023-06-21 NOTE — Procedures (Signed)
   HISTORY: 53 year old female with history of depression anxiety, presenting with intermittent episode of stuttering of the speech, frequent body jerking movement  TECHNIQUE:  This is a routine 16 channel EEG recording with one channel devoted to a limited EKG recording.  It was performed during wakefulness, drowsiness and asleep.  Hyperventilation and photic stimulation were performed as activating procedures.  There are minimum muscle and movement artifact noted.  Upon maximum arousal, posterior dominant waking rhythm consistent of mildly dysrhythmic mixed alpha and theta range activity, activities are symmetric over the bilateral posterior derivations and attenuated with eye opening.  Photic stimulation did not alter the tracing.  Hyperventilation produced mild/moderate buildup with higher amplitude and the slower activities noted.  During EEG recording, patient developed drowsiness and no deeper stage of sleep was achieved.  During EEG recording, there was intermittent synchronized high amplitude beta range activity, increased occurrence during drowsiness with occasionally sharp form.  EKG demonstrate normal sinus rhythm.  CONCLUSION: This is a mild abnormal EEG.  There is mild background slowing, with intermittent protrusion of synchronize high amplitude beta range activity, indicating mild bihemispheric malfunction.  Levert Feinstein, M.D. Ph.D.  [facility] [address] [place], [place] [address] Phone: [phone] Fax:      [phone]

## 2023-06-21 NOTE — Telephone Encounter (Signed)
Please call patient  EEG was mildly abnormal, mild background slowing, with intermittent protrusion of synchronized higher amplitude slower activity, suggest mild bihemispheric malfunction  I have discussed with epileptologist Dr.Camara, agree with prolonged ambulatory EEG  Orders Placed This Encounter  Procedures   AMBULATORY EEG

## 2023-06-22 ENCOUNTER — Ambulatory Visit: Payer: Medicaid Other | Admitting: Family

## 2023-06-25 ENCOUNTER — Telehealth: Payer: Self-pay

## 2023-06-25 NOTE — Telephone Encounter (Signed)
 Call to patient, reviewed EEG results, she verbalized understanding and she is in agreement for ambulatory EEG

## 2023-07-03 NOTE — Progress Notes (Unsigned)
 [name]

## 2023-07-04 ENCOUNTER — Ambulatory Visit: Payer: Medicaid Other | Admitting: Family

## 2023-07-04 NOTE — Procedures (Signed)
 [facility]  HOME SLEEP TEST (SANSA) REPORT (Mail-Out Device):   STUDY DATE: 06/20/23  DOB: 04-Jan-1971  MRN: [id number]  ORDERING CLINICIAN: Huston Foley, MD, PhD   REFERRING CLINICIAN: Mort Sawyers, FNP   CLINICAL INFORMATION/HISTORY: 53 year old female with an underlying medical history of low back pain, reflux disease, allergies, anxiety, depression, fatty liver disease, cervical cancer, status post hysterectomy, status post bladder suspension surgery, status post parotidectomy, status post cholecystectomy and overweight state, who reports snoring and excessive daytime somnolence as well as witnessed apneas.   PATIENT'S LAST REPORTED EPWORTH SLEEPINESS SCORE (ESS): 22/24.  BMI (at the time of sleep clinic visit and/or test date): 29.9 kg/m  FINDINGS:   Study Protocol:    The SANSA single-point-of-skin-contact chest-worn sensor - an FDA cleared and DOT approved type 4 home sleep test device - measures eight physiological channels,  including blood oxygen saturation (measured via PPG [photoplethysmography]), EKG-derived heart rate, respiratory effort, chest movement (measured via accelerometer), snoring, body position, and actigraphy. The device is designed to be worn for up to 10 hours per study.   Sleep Summary:   Total Recording Time (hours, min): 7 hours, 38 min  Total Effective Sleep Time (hours, min):  4 hours, 48 min  Sleep Efficiency (%):    63%   Respiratory Indices:   Calculated sAHI (per hour):  15.6/hour - utilizing the 4% desaturation criteria for obstructive hypopneas, per Medicaid guidelines.         Oxygen Saturation Statistics:    Oxygen Saturation (%) Mean: 94.4%   Minimum oxygen saturation (%):                 73.4%   O2 Saturation Range (%): 73.4 - 100%   Time below or at 88% saturation: 4 min   Pulse Rate Statistics:   Pulse Mean (bpm):    70/min    Pulse Range (59 - 110/min)   Snoring: mild to moderate    IMPRESSION/DIAGNOSES:   OSA (obstructive sleep apnea), moderate    RECOMMENDATIONS:   This home sleep test demonstrates moderate obstructive sleep apnea with a total AHI of 15.6/hour and O2 nadir of 73.4%. Mild to moderate snoring was detected. Treatment with a positive airway pressure (PAP) device is recommended. The patient will be advised to proceed with an autoPAP titration/trial at home for now. A full night titration study may be considered to optimize treatment settings, monitor proper oxygen saturations and aid with improvement of tolerance and adherence, if needed down the road. Alternative treatment options may include a dental device through dentistry or orthodontics in selected patients or Inspire (hypoglossal nerve stimulator) in carefully selected patients (meeting inclusion criteria).  Concomitant weight loss is recommended (where clinically appropriate). Please note that untreated obstructive sleep apnea may carry additional perioperative morbidity. Patients with significant obstructive sleep apnea should receive perioperative PAP therapy and the surgeons and particularly the anesthesiologist should be informed of the diagnosis and the severity of the sleep disordered breathing. The patient should be cautioned not to drive, work at heights, or operate dangerous or heavy equipment when tired or sleepy. Review and reiteration of good sleep hygiene measures should be pursued with any patient. Other causes of the patient's symptoms, including circadian rhythm disturbances, an underlying mood disorder, medication effect and/or an underlying medical problem cannot be ruled out based on this test. Clinical correlation is recommended.  The patient and her referring provider will be notified of the test results. The patient will be seen in follow up  in sleep clinic at [facility].  I certify that I have reviewed the raw data recording prior to the issuance of this report in accordance with the  standards of the American Academy of Sleep Medicine (AASM).    INTERPRETING PHYSICIAN:   Huston Foley, MD, PhD Medical Director, [facility]) Diplomat, ABPN (Neurology and Sleep)   [facility] [address], [address] [place], [place] [address] [phone]

## 2023-07-04 NOTE — Addendum Note (Signed)
 Addended by: Huston Foley on: 07/04/2023 04:39 PM   Modules accepted: Orders

## 2023-07-06 ENCOUNTER — Other Ambulatory Visit: Payer: Self-pay | Admitting: *Deleted

## 2023-07-06 DIAGNOSIS — Z1231 Encounter for screening mammogram for malignant neoplasm of breast: Secondary | ICD-10-CM

## 2023-07-08 ENCOUNTER — Other Ambulatory Visit: Payer: Self-pay | Admitting: Family

## 2023-07-08 DIAGNOSIS — K130 Diseases of lips: Secondary | ICD-10-CM

## 2023-07-08 DIAGNOSIS — R252 Cramp and spasm: Secondary | ICD-10-CM

## 2023-07-09 ENCOUNTER — Telehealth: Payer: Self-pay | Admitting: *Deleted

## 2023-07-09 NOTE — Telephone Encounter (Signed)
 Spoke with pt. States that she does not see neuro until 07/27/23. Per the pt, the muscle relaxer is helping her.

## 2023-07-09 NOTE — Telephone Encounter (Signed)
 Please call pt as I can never understand her messages.   Is she still taking the muscle relaxer? Has the neurologist requested she stay on this? I had only given it preventatively until she was seen by them so curious if she still needs this?

## 2023-07-09 NOTE — Telephone Encounter (Signed)
 Orders sent to [facility].

## 2023-07-09 NOTE — Telephone Encounter (Signed)
-----   Message from Huston Foley sent at 07/04/2023  4:38 PM EST ----- Patient referred by PCP, seen by me on 06/06/23, patient had a HST on 06/20/23.    Please call and notify the patient that the recent home sleep test showed obstructive sleep apnea in the moderate range. I recommend treatment in the form of autoPAP, which means, that we don't have to bring her in for a sleep study with CPAP, but will let her start using a so called autoPAP machine at home, which is a CPAP-like machine with self-adjusting pressures. We will send the order to a local DME company (of her choice, or as per insurance requirement). The DME representative will fit her with a mask, educate her on how to use the machine, how to put the mask on, etc. I have placed an order in the chart. Please send the order, talk to patient, send report to referring MD. We will need a FU in sleep clinic for 10 weeks post-PAP set up, please arrange that with me or one of our NPs. Also reinforce the need for compliance with treatment. Thanks,   Huston Foley, MD, PhD [facility])

## 2023-07-09 NOTE — Telephone Encounter (Signed)
 I called pt and gave her results of sleep study.  Moderate OSA.  Recommend autopap, no additional testing.  Use 4 hrs minimum, for compliance.  Will send order to [facility], they will authorize and call you,  I gave her # [phone] for her to call in a week if they have not called her.  We will see back in 2-3 months for appt (she will call once gets machine).  She verbalized understanding. DME to go over machine, supplies, fit for mask.  I answered her questions.  She verbalized understanding.

## 2023-07-10 ENCOUNTER — Telehealth: Payer: Self-pay

## 2023-07-10 ENCOUNTER — Encounter: Payer: Self-pay | Admitting: Family

## 2023-07-10 ENCOUNTER — Telehealth: Payer: Self-pay | Admitting: Neurology

## 2023-07-10 DIAGNOSIS — G4733 Obstructive sleep apnea (adult) (pediatric): Secondary | ICD-10-CM

## 2023-07-10 HISTORY — DX: Obstructive sleep apnea (adult) (pediatric): G47.33

## 2023-07-10 NOTE — Telephone Encounter (Signed)
 Call to patients insurance, spoke with 3 different people ( mekayla, sonya, and didn't catch last persons name) . They states someone from [facility] needed to call since they submitted insurance verification. Call to [facility] at [facility], also left message for Amari at [facility]

## 2023-07-10 NOTE — Progress Notes (Signed)
 noted

## 2023-07-10 NOTE — Telephone Encounter (Signed)
 Sent to Dr. Terrace Arabia to check time availablilty.

## 2023-07-10 NOTE — Telephone Encounter (Signed)
 Spoke with Erin Dorsey, she will have billing reach back out to insurance company on correct chain of commancd and call back for 3 way call if needed. She states [facility] will work with patient so insurance in not a factor in getting EEG if needed

## 2023-07-10 NOTE — Telephone Encounter (Signed)
 [facility] please check email regarding Erin Dorsey, a Dr. Terrace Arabia patient. Insurance needs Dr. Zannie Cove authorization as soon as possible. Would like a call back.

## 2023-07-11 NOTE — Telephone Encounter (Signed)
 Addressed in another note. [facility] will reach out id peer to peer is needed, last discussed with insurance company that [facility] needed to complete PA with insurance company

## 2023-07-16 ENCOUNTER — Ambulatory Visit: Admitting: Family

## 2023-07-16 ENCOUNTER — Encounter: Payer: Self-pay | Admitting: Family

## 2023-07-16 VITALS — BP 132/88 | HR 80 | Temp 98.2°F | Ht 68.0 in | Wt 191.0 lb

## 2023-07-16 DIAGNOSIS — R9401 Abnormal electroencephalogram [EEG]: Secondary | ICD-10-CM | POA: Diagnosis not present

## 2023-07-16 DIAGNOSIS — M5442 Lumbago with sciatica, left side: Secondary | ICD-10-CM | POA: Diagnosis not present

## 2023-07-16 DIAGNOSIS — R768 Other specified abnormal immunological findings in serum: Secondary | ICD-10-CM | POA: Diagnosis not present

## 2023-07-16 DIAGNOSIS — M2550 Pain in unspecified joint: Secondary | ICD-10-CM | POA: Diagnosis not present

## 2023-07-16 DIAGNOSIS — M5441 Lumbago with sciatica, right side: Secondary | ICD-10-CM | POA: Diagnosis not present

## 2023-07-16 DIAGNOSIS — R252 Cramp and spasm: Secondary | ICD-10-CM

## 2023-07-16 DIAGNOSIS — G8929 Other chronic pain: Secondary | ICD-10-CM

## 2023-07-16 MED ORDER — PREDNISONE 10 MG (21) PO TBPK: ORAL_TABLET | ORAL | 0 refills | Status: DC

## 2023-07-16 NOTE — Assessment & Plan Note (Signed)
 Cont gabapentnin 400 mg tid  Referral for neurosurgeon Declines PT has had in past, new relief Stretches sent to mychart  Rx prednisone pack  Heat to site/ gentle massage

## 2023-07-16 NOTE — Assessment & Plan Note (Signed)
 Reviewed however advised pt to f/u with neurology for recommendations and interpretations

## 2023-07-16 NOTE — Assessment & Plan Note (Signed)
 Baclofen helping Discussed s/e drowsiness

## 2023-07-16 NOTE — Patient Instructions (Signed)
  A referral was placed today for both rheumatology and neurosurgery (for back) Please let us know if you have not heard back within 2 weeks about the referral.

## 2023-07-16 NOTE — Progress Notes (Signed)
 Established Patient Office Visit  Subjective:   Patient ID: Erin Dorsey, female    DOB: Aug 14, 1970  Age: 53 y.o. MRN: [id number]  CC:  Chief Complaint  Patient presents with   Hip Pain    HPI: Erin Dorsey is a 53 y.o. female presenting on 07/16/2023 for Hip Pain  C/o hip and lower back pain on the right side with radiation of pain down the leg.  Her upper thighs went numb twice last week, not this week. Still hurting along the right lower back into the right lateral upper thigh side. Worse with walking and with lying down. No acute trauma or injury that she knows. She is using a walker to walk to keep her balance improved. She does take gabapentin 400 mg at night time. Sx have been going on for 'a while' worse in the last 1-2 weeks. She has done physical therapy in the past for this, she states it did not help. She states this flares often for her, every few weeks for years. She does see Dr. Ebony Hail pain management as well, on gabapentin 400 mg tid   Lumbar XRAY 1/16 moderate DJD L5-S1  Has never seen a neurosurgeon   Neurology, has f/u appt 3/28  EEG mildly abn EEG mild background slowing intermittent protrusion , mild bi-hemispheric malfunction. Still with some jerking motions and at times will stutter.  Is taking baclofen which she states is really helping the spasms.   Neurology lab testing showed positive ANA with positive SSA. She does note dry eyes, vaginal dryness. Does report bil hand stiffness in am and bil lower leg stiffness. Often with low back pain and suffers from sciatica. Some times can not grip on her right hand. The stiffness takes about 30 min in am to work out  [employer] from Last 3 Encounters:  07/16/23 191 lb (86.6 kg)  06/06/23 199 lb 12.8 oz (90.6 kg)  05/17/23 198 lb (89.8 kg)   Recent sleep study, OSA moderate. Awaiting cpap machine  MRI brain, mild cerebral white matter disease, suspected to be r/o chronic microvascular ischemic disease       ROS: Negative unless specifically indicated above in HPI.   Relevant past medical history reviewed and updated as indicated.   Allergies and medications reviewed and updated.   Current Outpatient Medications:    amitriptyline (ELAVIL) 25 MG tablet, Take 1 tablet (25 mg total) by mouth at bedtime., Disp: 90 tablet, Rfl: 3   baclofen (LIORESAL) 10 MG tablet, TAKE 0.5 TABLETS BY MOUTH 3 TIMES DAILY AS NEEDED FOR MUSCLE SPASMS., Disp: 30 tablet, Rfl: 0   busPIRone (BUSPAR) 15 MG tablet, Take 1 tablet (15 mg total) by mouth 3 (three) times daily., Disp: 270 tablet, Rfl: 4   cetirizine (ZYRTEC) 10 MG tablet, Take 1 tablet (10 mg total) by mouth daily., Disp: 90 tablet, Rfl: 3   clotrimazole-betamethasone (LOTRISONE) cream, APPLY 1 APPLICATION TOPICALLY DAILY, Disp: 15 g, Rfl: 1   FLUoxetine HCl 60 MG TABS, Take 60 mg by mouth daily., Disp: 90 tablet, Rfl: 3   fluticasone (FLOVENT HFA) 110 MCG/ACT inhaler, Inhale 1 puff into the lungs in the morning and at bedtime., Disp: 1 each, Rfl: 12   gabapentin (NEURONTIN) 400 MG capsule, Take 400 mg by mouth 3 (three) times daily., Disp: , Rfl:    Hydrocortisone Acetate 1 % CREA, Apply 1 application  topically daily as needed (heat rash)., Disp: 15 g, Rfl: 0   hydrOXYzine (ATARAX) 25 MG tablet,  TAKE 1 TABLET BY MOUTH EVERYDAY AT BEDTIME, Disp: 90 tablet, Rfl: 2   mupirocin ointment (BACTROBAN) 2 %, APPLY TO AFFECTED AREA TOPICALLY EVERY DAY, Disp: 22 g, Rfl: 0   nystatin (MYCOSTATIN/NYSTOP) powder, APPLY TO AFFECTED AREA 3 TIMES A DAY, Disp: 15 g, Rfl: 5   omeprazole (PRILOSEC) 20 MG capsule, TAKE 1 CAPSULE BY MOUTH EVERY DAY, Disp: 90 capsule, Rfl: 1   ondansetron (ZOFRAN) 4 MG tablet, TAKE 1 TABLET BY MOUTH EVERY 8 HOURS AS NEEDED FOR NAUSEA AND VOMITING, Disp: 20 tablet, Rfl: 0   pravastatin (PRAVACHOL) 10 MG tablet, Take 10 mg by mouth at bedtime., Disp: , Rfl:    predniSONE (STERAPRED UNI-PAK 21 TAB) 10 MG (21) TBPK tablet, Take as directed, Disp: 1  each, Rfl: 0   promethazine (PHENERGAN) 25 MG tablet, Take 25 mg by mouth every 6 (six) hours as needed for nausea or vomiting., Disp: , Rfl:    SUBOXONE 8-2 MG FILM, Take 1 Film by mouth 3 (three) times daily., Disp: , Rfl:    valACYclovir (VALTREX) 500 MG tablet, TAKE 2 TABLETS BY MOUTH EVERY DAY, Disp: 60 tablet, Rfl: 2   VENTOLIN HFA 108 (90 Base) MCG/ACT inhaler, TAKE 2 PUFFS BY MOUTH EVERY 6 HOURS AS NEEDED FOR WHEEZE OR SHORTNESS OF BREATH, Disp: 18 each, Rfl: 2  Allergies  Allergen Reactions   Amoxicillin Rash   Penicillins Rash    Objective:   BP 132/88 (BP Location: Right Arm, Patient Position: Sitting)   Pulse 80   Temp 98.2 F (36.8 C) (Temporal)   Ht 5\' 8"  (1.727 m)   Wt 191 lb (86.6 kg)   LMP 06/23/2015   SpO2 95%   BMI 29.04 kg/m    Physical Exam Constitutional:      General: She is not in acute distress.    Appearance: Normal appearance. She is normal weight. She is not ill-appearing, toxic-appearing or diaphoretic.  HENT:     Head: Normocephalic.  Cardiovascular:     Rate and Rhythm: Normal rate and regular rhythm.  Pulmonary:     Effort: Pulmonary effort is normal.     Breath sounds: Normal breath sounds.  Musculoskeletal:     Right hand: Decreased strength.     Lumbar back: Tenderness present. Decreased range of motion (movement pain). Positive right straight leg raise test.  Neurological:     General: No focal deficit present.     Mental Status: She is alert and oriented to person, place, and time. Mental status is at baseline.  Psychiatric:        Mood and Affect: Mood normal.        Behavior: Behavior normal.        Thought Content: Thought content normal.        Judgment: Judgment normal.     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  Mort Sawyers, FNP

## 2023-07-16 NOTE — Assessment & Plan Note (Signed)
 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

## 2023-07-18 ENCOUNTER — Encounter: Payer: Self-pay | Admitting: Family

## 2023-07-18 NOTE — Progress Notes (Unsigned)
 Referring Physician:  Mort Sawyers, FNP [address] [place],  [place] [address]  Primary Physician:  Mort Sawyers, FNP  History of Present Illness: 07/19/2023 Erin Dorsey has a history of HTN, asthma, OSA, GERD, history of cervical CA, obesity, chronic pain, bipolar, hyperlipidemia.   History of chronic back and right leg pain with intermittent flare ups x years.   She is in a flare up now with constant right sided LBP with right posterior leg pain to her feet x 1-2 weeks. No left leg pain. She has intermittent numbness and tingling in both legs. Pain is worse with standing and working in the house. Some relief with heat. She has weakness in both legs.   She is on SUBOXONE. She sees [facility]. She is also taking neurontin and baclofen. She has some relief with these medications  Was given prednisone by PCP on 07/16/23. This is helping.   She quit smoking in 2021.   Bowel/Bladder Dysfunction: none, some bowel urgency since having her gallbladder removed.   Conservative measures:  Physical therapy: did in the past with no relief, none within the past year Multimodal medical therapy including regular antiinflammatories: baclofen, gabapentin, prednisone Injections:  she has had epidural steroid injections about 3-4 years ago, these were not helpful  Past Surgery: no spinal surgeries  MYKELL GENAO has no symptoms of cervical myelopathy.  The symptoms are causing a significant impact on the patient's life.   Review of Systems:  A 10 point review of systems is negative, except for the pertinent positives and negatives detailed in the HPI.  Past Medical History: Past Medical History:  Diagnosis Date   Allergy    Anxiety    Cervical cancer (HCC) 2011   stage 3   Chronic headaches    Depression    Fatty liver    Frequent falls    GERD (gastroesophageal reflux disease)    Lumbar radiculopathy    Moderate obstructive sleep apnea 07/10/2023     Past Surgical History: Past Surgical History:  Procedure Laterality Date   ABDOMINAL HYSTERECTOMY     Total   ANTERIOR AND POSTERIOR REPAIR N/A 04/12/2020   Procedure: ANTERIOR (CYSTOCELE) AND POSTERIOR REPAIR (RECTOCELE);  Surgeon: Linzie Collin, MD;  Location: [facility] ORS;  Service: Gynecology;  Laterality: N/A;   BLADDER SUSPENSION N/A 04/12/2020   Procedure: [facility] PROCEDURE;  Surgeon: Linzie Collin, MD;  Location: [facility] ORS;  Service: Gynecology;  Laterality: N/A;  TOT   CHOLECYSTECTOMY N/A 11/30/2022   Procedure: LAPAROSCOPIC CHOLECYSTECTOMY;  Surgeon: Fritzi Mandes, MD;  Location[facility];  Service: General;  Laterality: N/A;   COLONOSCOPY WITH PROPOFOL N/A 01/26/2021   Procedure: COLONOSCOPY WITH PROPOFOL;  Surgeon: Earline Mayotte, MD;  Location: [facility] ENDOSCOPY;  Service: Endoscopy;  Laterality: N/A;   COLPOSCOPY  2011 or 2012   stage 3-removed cancerous spot on the cervix   ESOPHAGOGASTRODUODENOSCOPY (EGD) WITH PROPOFOL N/A 01/26/2021   Procedure: ESOPHAGOGASTRODUODENOSCOPY (EGD) WITH PROPOFOL;  Surgeon: Earline Mayotte, MD;  Location: [facility] ENDOSCOPY;  Service: Endoscopy;  Laterality: N/A;   EXCISION OF TONGUE LESION N/A 10/15/2019   Procedure: EXCISION OF TONGUE LESION;  Surgeon: Geanie Logan, MD;  Location: [facility] ORS;  Service: ENT;  Laterality: N/A;   FOOT SURGERY Right    x 2-took knots out from bottom of the foot-not sure of the technical name   LAPAROSCOPIC VAGINAL HYSTERECTOMY WITH SALPINGO OOPHORECTOMY Bilateral 04/12/2020   Procedure: LAPAROSCOPIC ASSISTED VAGINAL HYSTERECTOMY WITH  SALPINGO OOPHORECTOMY;  Surgeon: Linzie Collin, MD;  Location: [facility] ORS;  Service: Gynecology;  Laterality: Bilateral;   PAROTIDECTOMY Right 10/15/2019   Procedure: PAROTIDECTOMY;  Surgeon: Geanie Logan, MD;  Location: [facility] ORS;  Service: ENT;  Laterality: Right;   TUBAL LIGATION      Allergies: Allergies as of 07/19/2023 - Review Complete 07/19/2023  Allergen Reaction  Noted   Amoxicillin Rash 10/02/2019   Penicillins Rash 07/22/2015    Medications: Outpatient Encounter Medications as of 07/19/2023  Medication Sig   amitriptyline (ELAVIL) 25 MG tablet Take 1 tablet (25 mg total) by mouth at bedtime.   baclofen (LIORESAL) 10 MG tablet TAKE 0.5 TABLETS BY MOUTH 3 TIMES DAILY AS NEEDED FOR MUSCLE SPASMS.   busPIRone (BUSPAR) 15 MG tablet Take 1 tablet (15 mg total) by mouth 3 (three) times daily.   cetirizine (ZYRTEC) 10 MG tablet Take 1 tablet (10 mg total) by mouth daily.   clotrimazole-betamethasone (LOTRISONE) cream APPLY 1 APPLICATION TOPICALLY DAILY   FLUoxetine HCl 60 MG TABS Take 60 mg by mouth daily.   fluticasone (FLOVENT HFA) 110 MCG/ACT inhaler Inhale 1 puff into the lungs in the morning and at bedtime.   gabapentin (NEURONTIN) 400 MG capsule Take 400 mg by mouth 3 (three) times daily.   Hydrocortisone Acetate 1 % CREA Apply 1 application  topically daily as needed (heat rash).   hydrOXYzine (ATARAX) 25 MG tablet TAKE 1 TABLET BY MOUTH EVERYDAY AT BEDTIME   mupirocin ointment (BACTROBAN) 2 % APPLY TO AFFECTED AREA TOPICALLY EVERY DAY   nystatin (MYCOSTATIN/NYSTOP) powder APPLY TO AFFECTED AREA 3 TIMES A DAY   omeprazole (PRILOSEC) 20 MG capsule TAKE 1 CAPSULE BY MOUTH EVERY DAY   ondansetron (ZOFRAN) 4 MG tablet TAKE 1 TABLET BY MOUTH EVERY 8 HOURS AS NEEDED FOR NAUSEA AND VOMITING   pravastatin (PRAVACHOL) 10 MG tablet Take 10 mg by mouth at bedtime.   predniSONE (STERAPRED UNI-PAK 21 TAB) 10 MG (21) TBPK tablet Take as directed   promethazine (PHENERGAN) 25 MG tablet Take 25 mg by mouth every 6 (six) hours as needed for nausea or vomiting.   SUBOXONE 8-2 MG FILM Take 1 Film by mouth 3 (three) times daily.   valACYclovir (VALTREX) 500 MG tablet TAKE 2 TABLETS BY MOUTH EVERY DAY   VENTOLIN HFA 108 (90 Base) MCG/ACT inhaler TAKE 2 PUFFS BY MOUTH EVERY 6 HOURS AS NEEDED FOR WHEEZE OR SHORTNESS OF BREATH   No facility-administered encounter  medications on file as of 07/19/2023.    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  General: Patient is well developed, well nourished, calm, collected, and in no apparent distress. Attention to examination is appropriate.  Respiratory: Patient is breathing without any difficulty.   NEUROLOGICAL:     Awake, alert, oriented to person, place, and time.  Speech is clear and fluent. Fund of knowledge is appropriate.   Cranial Nerves: Pupils equal round and reactive to light.  Facial tone is symmetric.    Mild diffuse posterior lumbar tenderness.   No abnormal lesions on exposed skin.   Strength: Side Biceps Triceps Deltoid Interossei Grip Wrist Ext. Wrist Flex.  R 5 5 5 5 5 5 5   L 5 5 5 5 5 5 5    Side Iliopsoas Quads Hamstring PF DF EHL  R 5 5 5 5 5 5   L 5 5 5 5 5 5    Reflexes are 2+ and symmetric at the biceps, brachioradialis, patella and achilles.   Hoffman's is absent.  Clonus is not present.   Bilateral upper and lower extremity  sensation is intact to light touch, but diminished both medial ankles/feet.   She has a slow gait. She ambulates with a walker.    Medical Decision Making  Imaging: Lumbar xrays dated 05/17/23:  FINDINGS: There is no evidence of lumbar spine fracture. Alignment is normal. Moderate narrow intervertebral space with facet joint sclerosis and vacuum disc phenomenon at L5-S1.   IMPRESSION: Moderate degenerative joint changes at L5-S1.     Electronically Signed   By: Sherian Rein M.D.   On: 05/17/2023 14:06  I have personally reviewed the images and agree with the above interpretation.  Assessment and Plan: Ms. Pardi has a history of chronic back and right leg pain with intermittent flare ups x years.   She is in a flare up now with constant right sided LBP with right posterior leg pain to her feet x 1-2 weeks. No left leg pain. She has intermittent numbness, weakness, and tingling in both legs.   She has known lumbar spondylosis with vacuum disc at L5-S1. This is likely contributing to her pain.   Treatment options discussed with patient and following plan made:   - MRI of lumbar spine to further evaluate lumbar radiculopathy. She requests WIDE BORE MRI.  - Continue current medications from other providers including SUBOXONE, baclofen, and neurontin. She sees [facility].  - Depending on results of MRI, may consider PT and/or injections.  - Will schedule follow up visit to review MRI results once I get them back.   I spent a total of 35 minutes in face-to-face and non-face-to-face activities related to this patient's care today including review of outside records, review of imaging, review of symptoms, physical exam, discussion of differential diagnosis, discussion of treatment options, and documentation.   Thank you for involving me in the care of this patient.   Drake Leach PA-C Dept. of Neurosurgery

## 2023-07-19 ENCOUNTER — Ambulatory Visit (INDEPENDENT_AMBULATORY_CARE_PROVIDER_SITE_OTHER): Admitting: Orthopedic Surgery

## 2023-07-19 ENCOUNTER — Encounter: Payer: Self-pay | Admitting: Orthopedic Surgery

## 2023-07-19 VITALS — BP 124/84 | Ht 68.0 in | Wt 186.4 lb

## 2023-07-19 DIAGNOSIS — M51362 Other intervertebral disc degeneration, lumbar region with discogenic back pain and lower extremity pain: Secondary | ICD-10-CM

## 2023-07-19 DIAGNOSIS — M4726 Other spondylosis with radiculopathy, lumbar region: Secondary | ICD-10-CM | POA: Diagnosis not present

## 2023-07-19 DIAGNOSIS — M5416 Radiculopathy, lumbar region: Secondary | ICD-10-CM

## 2023-07-19 DIAGNOSIS — M47816 Spondylosis without myelopathy or radiculopathy, lumbar region: Secondary | ICD-10-CM

## 2023-07-19 NOTE — Patient Instructions (Signed)
 It was so nice to see you today. Thank you so much for coming in.    You have some wear and tear your back with a very degenerated disc at L5-S1. This is likely what is causing your pain.  I want to get an MRI of your lower back to look into things further. We will get this approved through your insurance and [facility] ([address] in [place]) will call you to schedule the appointment. Be sure you are scheduled for the WIDE BORE MRI.   After you have the MRI, it takes 14-21 days for me to get the results back. Once I have them, we will call you to schedule a follow up visit with me to review them.   Please do not hesitate to call if you have any questions or concerns. You can also message me in MyChart.   Drake Leach PA-C [phone]     The physicians and staff at [facility] at [facility] are committed to providing excellent care. You may receive a survey asking for feedback about your experience at our office. We value you your feedback and appreciate you taking the time to to fill it out. The [facility] leadership team is also available to discuss your experience in person, feel free to contact us [phone].

## 2023-07-20 NOTE — Telephone Encounter (Signed)
Do not see where you have given in the past ok to refill?

## 2023-07-24 ENCOUNTER — Other Ambulatory Visit: Payer: Self-pay | Admitting: Family

## 2023-07-24 ENCOUNTER — Encounter: Payer: Self-pay | Admitting: Orthopedic Surgery

## 2023-07-24 DIAGNOSIS — L304 Erythema intertrigo: Secondary | ICD-10-CM

## 2023-07-24 MED ORDER — NYSTATIN 100000 UNIT/GM EX POWD: CUTANEOUS | 5 refills | Status: DC

## 2023-07-27 ENCOUNTER — Other Ambulatory Visit

## 2023-07-27 ENCOUNTER — Other Ambulatory Visit: Payer: Self-pay | Admitting: Family

## 2023-07-27 DIAGNOSIS — G8929 Other chronic pain: Secondary | ICD-10-CM

## 2023-07-30 ENCOUNTER — Other Ambulatory Visit: Payer: Self-pay | Admitting: Family

## 2023-07-30 DIAGNOSIS — G8929 Other chronic pain: Secondary | ICD-10-CM

## 2023-07-31 ENCOUNTER — Encounter: Payer: Self-pay | Admitting: Family

## 2023-07-31 DIAGNOSIS — G8929 Other chronic pain: Secondary | ICD-10-CM

## 2023-07-31 MED ORDER — PREDNISONE 10 MG (21) PO TBPK: ORAL_TABLET | ORAL | 0 refills | Status: DC

## 2023-08-01 ENCOUNTER — Other Ambulatory Visit: Payer: Self-pay | Admitting: Family

## 2023-08-01 DIAGNOSIS — R252 Cramp and spasm: Secondary | ICD-10-CM

## 2023-08-06 ENCOUNTER — Ambulatory Visit
Admission: RE | Admit: 2023-08-06 | Discharge: 2023-08-06 | Disposition: A | Discharge status: Home / Self Care | Source: Ambulatory Visit | Attending: Family | Admitting: Family

## 2023-08-06 DIAGNOSIS — Z1231 Encounter for screening mammogram for malignant neoplasm of breast: Secondary | ICD-10-CM

## 2023-08-09 ENCOUNTER — Encounter: Payer: Self-pay | Admitting: Family

## 2023-08-10 ENCOUNTER — Other Ambulatory Visit: Payer: Self-pay | Admitting: Family

## 2023-08-10 ENCOUNTER — Ambulatory Visit: Payer: Self-pay | Admitting: Neurology

## 2023-08-10 DIAGNOSIS — R928 Other abnormal and inconclusive findings on diagnostic imaging of breast: Secondary | ICD-10-CM

## 2023-08-11 ENCOUNTER — Ambulatory Visit
Admission: RE | Admit: 2023-08-11 | Discharge: 2023-08-11 | Disposition: A | Discharge status: Home / Self Care | Source: Ambulatory Visit | Attending: Orthopedic Surgery | Admitting: Orthopedic Surgery

## 2023-08-11 DIAGNOSIS — M5416 Radiculopathy, lumbar region: Secondary | ICD-10-CM

## 2023-08-11 DIAGNOSIS — M47816 Spondylosis without myelopathy or radiculopathy, lumbar region: Secondary | ICD-10-CM

## 2023-08-11 DIAGNOSIS — M51362 Other intervertebral disc degeneration, lumbar region with discogenic back pain and lower extremity pain: Secondary | ICD-10-CM

## 2023-08-14 ENCOUNTER — Other Ambulatory Visit: Payer: Self-pay | Admitting: Family

## 2023-08-14 DIAGNOSIS — K130 Diseases of lips: Secondary | ICD-10-CM

## 2023-08-15 ENCOUNTER — Encounter: Payer: Self-pay | Admitting: Family

## 2023-08-22 ENCOUNTER — Other Ambulatory Visit: Payer: Self-pay | Admitting: Family

## 2023-08-22 DIAGNOSIS — R252 Cramp and spasm: Secondary | ICD-10-CM

## 2023-08-23 DIAGNOSIS — G253 Myoclonus: Secondary | ICD-10-CM | POA: Diagnosis not present

## 2023-08-23 DIAGNOSIS — R569 Unspecified convulsions: Secondary | ICD-10-CM | POA: Diagnosis not present

## 2023-08-23 DIAGNOSIS — R9401 Abnormal electroencephalogram [EEG]: Secondary | ICD-10-CM

## 2023-08-27 ENCOUNTER — Ambulatory Visit
Admission: RE | Admit: 2023-08-27 | Discharge: 2023-08-27 | Disposition: A | Discharge status: Home / Self Care | Source: Ambulatory Visit | Attending: Family | Admitting: Family

## 2023-08-27 DIAGNOSIS — R928 Other abnormal and inconclusive findings on diagnostic imaging of breast: Secondary | ICD-10-CM

## 2023-08-30 ENCOUNTER — Encounter: Payer: Self-pay | Admitting: Family

## 2023-08-30 DIAGNOSIS — N6320 Unspecified lump in the left breast, unspecified quadrant: Secondary | ICD-10-CM

## 2023-08-31 NOTE — Progress Notes (Unsigned)
 Referring Physician:  No referring provider defined for this encounter.  Primary Physician:  Felicita Horns, FNP  History of Present Illness: 09/06/2023 [name] has a history of HTN, asthma, OSA, GERD, history of cervical CA, obesity, chronic pain, bipolar, hyperlipidemia.   History of chronic back and right leg pain with intermittent flare ups x years.   Last seen by me on 07/19/23 for flare up of constant right sided LBP with right leg pain. She has known lumbar spondylosis with vacuum disc at L5-S1. This is likely contributing to her pain.   She is here to review her lumbar MRI scan. Symptoms are about the same as at her last visit.   She has constant right sided LBP with constant right posterior leg pain to her foot and intermittent left posterior leg pain foot. LBP = leg pain, right leg pain > left leg pain. She has burning in both feet. She has intermittent numbness and tingling in both legs. Pain is worse with standing and working in the house. She has weakness in both legs.   She is on SUBOXONE. She sees [facility]. She is also taking neurontin  and baclofen . She has some relief with these medications  She quit smoking in 2021.   Bowel/Bladder Dysfunction: none, some bowel urgency since having her gallbladder removed.   Conservative measures:  Physical therapy: did in the past with no relief, none within the past year Multimodal medical therapy including regular antiinflammatories: baclofen , gabapentin , prednisone  Injections:  she has had epidural steroid injections about 3-4 years ago, these were not helpful  Past Surgery: no spinal surgeries  Erin Dorsey has no symptoms of cervical myelopathy.  The symptoms are causing a significant impact on the patient's life.   Review of Systems:  A 10 point review of systems is negative, except for the pertinent positives and negatives detailed in the HPI.  Past Medical History: Past Medical  History:  Diagnosis Date   Allergy    Anxiety    Cervical cancer (HCC) 2011   stage 3   Chronic headaches    Depression    Fatty liver    Frequent falls    GERD (gastroesophageal reflux disease)    Lumbar radiculopathy    Moderate obstructive sleep apnea 07/10/2023    Past Surgical History: Past Surgical History:  Procedure Laterality Date   ABDOMINAL HYSTERECTOMY     Total   ANTERIOR AND POSTERIOR REPAIR N/A 04/12/2020   Procedure: ANTERIOR (CYSTOCELE) AND POSTERIOR REPAIR (RECTOCELE);  Surgeon: Zenobia Hila, MD;  Location: [facility] ORS;  Service: Gynecology;  Laterality: N/A;   BLADDER SUSPENSION N/A 04/12/2020   Procedure: [facility] PROCEDURE;  Surgeon: Zenobia Hila, MD;  Location: [facility] ORS;  Service: Gynecology;  Laterality: N/A;  TOT   CHOLECYSTECTOMY N/A 11/30/2022   Procedure: LAPAROSCOPIC CHOLECYSTECTOMY;  Surgeon: Lujean Sake, MD;  Location: [facility];  Service: General;  Laterality: N/A;   COLONOSCOPY WITH PROPOFOL  N/A 01/26/2021   Procedure: COLONOSCOPY WITH PROPOFOL ;  Surgeon: Marshall Skeeter, MD;  Location: [facility] ENDOSCOPY;  Service: Endoscopy;  Laterality: N/A;   COLPOSCOPY  2011 or 2012   stage 3-removed cancerous spot on the cervix   ESOPHAGOGASTRODUODENOSCOPY (EGD) WITH PROPOFOL  N/A 01/26/2021   Procedure: ESOPHAGOGASTRODUODENOSCOPY (EGD) WITH PROPOFOL ;  Surgeon: Marshall Skeeter, MD;  Location: [facility] ENDOSCOPY;  Service: Endoscopy;  Laterality: N/A;   EXCISION OF TONGUE LESION N/A 10/15/2019   Procedure: EXCISION OF TONGUE LESION;  Surgeon: Von Grumbling, MD;  Location:  [facility] ORS;  Service: ENT;  Laterality: N/A;   FOOT SURGERY Right    x 2-took knots out from bottom of the foot-not sure of the technical name   LAPAROSCOPIC VAGINAL HYSTERECTOMY WITH SALPINGO OOPHORECTOMY Bilateral 04/12/2020   Procedure: LAPAROSCOPIC ASSISTED VAGINAL HYSTERECTOMY WITH SALPINGO OOPHORECTOMY;  Surgeon: Zenobia Hila, MD;  Location: [facility] ORS;  Service: Gynecology;   Laterality: Bilateral;   PAROTIDECTOMY Right 10/15/2019   Procedure: PAROTIDECTOMY;  Surgeon: Von Grumbling, MD;  Location: [facility] ORS;  Service: ENT;  Laterality: Right;   TUBAL LIGATION      Allergies: Allergies as of 09/06/2023 - Review Complete 09/06/2023  Allergen Reaction Noted   Amoxicillin Rash 10/02/2019   Penicillins Rash 07/22/2015    Medications: Outpatient Encounter Medications as of 09/06/2023  Medication Sig   amitriptyline  (ELAVIL ) 25 MG tablet Take 1 tablet (25 mg total) by mouth at bedtime.   baclofen  (LIORESAL ) 10 MG tablet TAKE 0.5 TABLETS BY MOUTH 3 TIMES DAILY AS NEEDED FOR MUSCLE SPASMS.   busPIRone  (BUSPAR ) 15 MG tablet Take 1 tablet (15 mg total) by mouth 3 (three) times daily.   cetirizine  (ZYRTEC ) 10 MG tablet Take 1 tablet (10 mg total) by mouth daily.   clotrimazole -betamethasone  (LOTRISONE ) cream APPLY 1 APPLICATION TOPICALLY DAILY   FLUoxetine  HCl 60 MG TABS Take 60 mg by mouth daily.   fluticasone  (FLOVENT  HFA) 110 MCG/ACT inhaler Inhale 1 puff into the lungs in the morning and at bedtime.   gabapentin  (NEURONTIN ) 400 MG capsule Take 400 mg by mouth 3 (three) times daily.   Hydrocortisone  Acetate 1 % CREA Apply 1 application  topically daily as needed (heat rash).   hydrOXYzine  (ATARAX ) 25 MG tablet TAKE 1 TABLET BY MOUTH EVERYDAY AT BEDTIME   mupirocin  ointment (BACTROBAN ) 2 % APPLY TO AFFECTED AREA TOPICALLY EVERY DAY   nystatin  (MYCOSTATIN /NYSTOP ) powder APPLY TO AFFECTED AREA 3 TIMES A DAY   omeprazole  (PRILOSEC) 20 MG capsule TAKE 1 CAPSULE BY MOUTH EVERY DAY   ondansetron  (ZOFRAN ) 4 MG tablet TAKE 1 TABLET BY MOUTH EVERY 8 HOURS AS NEEDED FOR NAUSEA AND VOMITING   pravastatin (PRAVACHOL) 10 MG tablet Take 10 mg by mouth at bedtime.   promethazine  (PHENERGAN ) 25 MG tablet Take 25 mg by mouth every 6 (six) hours as needed for nausea or vomiting.   SUBOXONE 8-2 MG FILM Take 1 Film by mouth 3 (three) times daily.   valACYclovir  (VALTREX ) 500 MG tablet  TAKE 2 TABLETS BY MOUTH EVERY DAY   VENTOLIN  HFA 108 (90 Base) MCG/ACT inhaler TAKE 2 PUFFS BY MOUTH EVERY 6 HOURS AS NEEDED FOR WHEEZE OR SHORTNESS OF BREATH   [DISCONTINUED] predniSONE  (STERAPRED UNI-PAK 21 TAB) 10 MG (21) TBPK tablet Take as directed   No facility-administered encounter medications on file as of 09/06/2023.    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  Speech is clear and fluent. Fund of knowledge is appropriate.   Cranial Nerves: Pupils equal round and reactive to light.  Facial tone is symmetric.    Mild diffuse posterior lumbar tenderness, worse on right side.   No abnormal lesions on exposed skin.   Strength: Side Iliopsoas Quads Hamstring PF DF EHL  R 5 5 5 5 5 5   L 5 5 5 5 5 5    Reflexes are 2+ and symmetric at the patella and achilles.    Clonus is not present.   Bilateral lower extremity sensation is intact to light touch, but diminished in right leg compared  to left.   She has a slow gait. She ambulates with a walker.    Medical Decision Making  Imaging: Lumbar MRI dated 08/11/23:  FINDINGS: There is stable alignment of the lumbar spine. Stable disc desiccation L5-S1 with mild caudal foraminal narrowing bilaterally. Mild facet arthrosis is present. There is a stable benign hemangioma in the L3 vertebral body. There is no vertebral body height loss, subluxation or marrow replacing process. The sacrum and SI joints are unremarkable so far as visualized. Conus and cauda equina are unremarkable.   T12-L1: There is no focal disc protrusion, foraminal or spinal stenosis.   L1-2: Mild disc desiccation and mild facet arthrosis. No significant foraminal or spinal stenosis.   L2-3: Mild disc desiccation and mild facet arthrosis. No significant foraminal or spinal stenosis.   L3-4: There is no focal disc protrusion, foraminal or spinal stenosis. Mild-to-moderate facet arthrosis.   L4-5: There is no focal disc protrusion, foraminal or spinal stenosis. Mild-to-moderate facet arthrosis.   L5-S1: Disc desiccation and moderate facet arthrosis with slight caudal bronchograms bilaterally. No impingement of the exiting L5 nerves. No significant interval change.   The retroperitoneal structures demonstrate no significant abnormality.   IMPRESSION: Stable disc desiccation L5-S1 with mild caudal foraminal narrowing bilaterally secondary to disc osteophyte. No significant interval change. No high-grade spinal or foraminal stenosis.   Electronically signed by: Adrien Alberta MD 08/29/2023 02:22 PM EDT RP Workstation: [id number]  I have personally reviewed the images and agree with the above interpretation.  Assessment and Plan: Ms. Maberry has a history of chronic back and right leg pain with intermittent flare ups x years.   She has constant right sided LBP with constant right posterior leg pain to her foot and intermittent left posterior leg pain  foot. LBP = leg pain, right leg pain > left leg pain. She has intermittent numbness and tingling in both legs with weakness.   She has known lumbar spondylosis with vacuum disc at L5-S1 with mild bilateral lateral recess stenosis.   Treatment options discussed with patient and following plan made:   - PT for lumbar spine. Orders to [facility] in [place].  - Referral to PMR at [facility] to discuss possible  lumbar injections. She understands they will not take over her pain medications.  - Continue current medications from other providers including SUBOXONE, baclofen , and neurontin . She sees [facility].  - Given form for handicapped placard.  - She asks about permanent disability. Discussed that she would need to apply on her own.  - Follow up with me in 6-8 weeks and prn. If no relief with above, she may be a surgical candidate.   I spent a total of 25 minutes in face-to-face and non-face-to-face activities related to this patient's care today including review of outside records, review of imaging, review of symptoms, physical exam, discussion of differential diagnosis, discussion of treatment options, and documentation.   Lucetta Russel PA-C Dept. of Neurosurgery

## 2023-09-04 ENCOUNTER — Telehealth: Payer: Self-pay | Admitting: Neurology

## 2023-09-04 NOTE — Telephone Encounter (Signed)
 LVM and sent mychart msg informing pt of need to reschedule 09/25/23 appt - MD Out

## 2023-09-04 NOTE — Telephone Encounter (Signed)
Reschedule patient appointment

## 2023-09-06 ENCOUNTER — Encounter: Payer: Self-pay | Admitting: Orthopedic Surgery

## 2023-09-06 ENCOUNTER — Ambulatory Visit: Admitting: Orthopedic Surgery

## 2023-09-06 VITALS — BP 118/80 | Ht 68.0 in | Wt 186.0 lb

## 2023-09-06 DIAGNOSIS — M47816 Spondylosis without myelopathy or radiculopathy, lumbar region: Secondary | ICD-10-CM

## 2023-09-06 DIAGNOSIS — M51362 Other intervertebral disc degeneration, lumbar region with discogenic back pain and lower extremity pain: Secondary | ICD-10-CM | POA: Diagnosis not present

## 2023-09-06 DIAGNOSIS — M5416 Radiculopathy, lumbar region: Secondary | ICD-10-CM

## 2023-09-06 DIAGNOSIS — M4726 Other spondylosis with radiculopathy, lumbar region: Secondary | ICD-10-CM | POA: Diagnosis not present

## 2023-09-06 NOTE — Patient Instructions (Signed)
 It was so nice to see you today. Thank you so much for coming in.    You have some wear and tear in your back at L5-S1 and this is likely causing your back and leg pain.   I sent physical therapy orders to [facility] in [place]. You can call them at [phone] if you don't hear from them to schedule your visit. They are located at [address], [address].   I want you to see physical medicine and rehab at the [facility] to discuss possible injections in your lower back. Dr. Erman Hayward, Dr. Aleen Ammons, and their PA Laurina Popper are great and will take good care of you. They should call you to schedule an appointment or you can call them at [phone].   Continue to see [facility] for your medications.   I will see you back in 6-8 weeks. Please do not hesitate to call if you have any questions or concerns. You can also message me in MyChart.   Lucetta Russel PA-C [phone]     The physicians and staff at [facility] at [facility] are committed to providing excellent care. You may receive a survey asking for feedback about your experience at our office. We value you your feedback and appreciate you taking the time to to fill it out. The [facility] leadership team is also available to discuss your experience in person, feel free to contact us  [phone].

## 2023-09-11 ENCOUNTER — Encounter (INDEPENDENT_AMBULATORY_CARE_PROVIDER_SITE_OTHER): Payer: Self-pay | Admitting: Neurology

## 2023-09-11 ENCOUNTER — Ambulatory Visit: Payer: Self-pay | Admitting: Neurology

## 2023-09-11 DIAGNOSIS — G253 Myoclonus: Secondary | ICD-10-CM

## 2023-09-11 NOTE — Procedures (Signed)
 Clinical History: 53 year old female with history of depression anxiety, presenting with intermittent episode of stuttering of the speech, frequent body jerking movement.  INTERMITTENT MONITORING with VIDEO TECHNICAL SUMMARY: This AVEEG was performed using equipment provided by [employer] utilizing Bluetooth ( [employer] ) amplifiers with continuous EEGT attended video collection using encrypted remote transmission via [employer] secured cellular tower network with data rates for each AVEEG performed. This is a Therapist, music AVEEG, obtained, according to the 10-20 international electrode placement system, reformatted digitally into referential and bipolar montages. Data was acquired with a minimum of 21 bipolar connections and sampled at a minimum rate of 250 cycles per second per channel, maximum rate of 450 cycles per second per channel and two channels for EKG. The entire VEEG study was recorded through cable and or radio telemetry for subsequent analysis. Specified epochs of the AVEEG data were identified at the direction of the subject by the depression of a push button by the patient. Each patients event file included data acquired two minutes prior to the push button activation and continuing until two minutes afterwards. AVEEG files were reviewed on [employer] server, Licensed Software provided by [employer] with a digital high frequency filter set at 70 Hz and a low frequency filter set at 1 Hz with a paper speed of 26mm/s resulting in 10 seconds per digital page. This entire AVEEG was reviewed by the EEG Technologist. Random time samples, random sleep samples, clips, patient initiated push button files with included patient daily diary logs, EEG Technologist pruned data was reviewed and verified for accuracy and validity by the governing reading neurologist in full details. This AEEGV was fully compliant with all requirements for CPT 97500 for setup, patient education, take  down and administered by an EEG technologist.  Long-Term EEG with Video was monitored intermittently by a qualified EEG technologist for the entirety of the recording; quality check-ins were performed at a minimum of every two hours, checking and documenting real-time data and video to assure the integrity and quality of the recording (e.g., camera position, electrode integrity and impedance), and identify the need for maintenance. For intermittent monitoring, an EEG Technologist monitored no more than 12 patients concurrently. Diagnostic video was captured at least 80% of the time during the recording.  PATIENT EVENTS: A notation was made 5 times. Patient log was reviewed with the patient at disconnect with the intent to reconcile events. Patient noted events for cramping on left and right side, jerking, and arm numbness.  TECHNOLOGIST EVENTS: There was no clear epileptiform activity was detected by the reviewing neurodiagnostic technologist for further review.  TIME SAMPLES: 10-minutes of every 2 hours recorded are reviewed as random time samples.  SLEEP SAMPLES: 5-minutes of every 24 hours recorded are reviewed as random sleep samples.  AWAKE: At maximal level of alertness, the posterior dominant background activity was continuous, reactive, low voltage rhythm of 8.5 Hz. This was symmetric, well-modulated, and attenuated with eye opening. Diffuse, symmetric, frontocentral beta range activity was present.  SLEEP: N1 Sleep (Stage 1) was observed and characterized by the disappearance of alpha rhythm and the appearance of vertex activity  N2 Sleep (Stage 2) was observed and characterized by vertex waves, K-complexes, and sleep spindles.  N3 (Stage 3) sleep was observed and characterized by high amplitude Delta activity of 20%.  REM sleep was observed.  EKG: There were no arrhythmias or abnormalities noted during this recording.  Impression: Normal EEG: awake and asleep  Clinical  Correlation: This is a  normal 3-day ambulatory EEG tracing. No focal abnormalities or epileptiform discharges were seen. There were no electrographic seizures noted. There was a total of five events as described above with no changes in EEG background. Please note a normal EEG does not exclude the diagnosis of epilepsy.   Estiben Mizuno, MD [facility]

## 2023-09-12 ENCOUNTER — Other Ambulatory Visit: Payer: Self-pay | Admitting: Family

## 2023-09-12 DIAGNOSIS — R252 Cramp and spasm: Secondary | ICD-10-CM

## 2023-09-25 ENCOUNTER — Ambulatory Visit: Admitting: Neurology

## 2023-09-25 ENCOUNTER — Other Ambulatory Visit: Payer: Self-pay | Admitting: Family

## 2023-09-25 DIAGNOSIS — G8929 Other chronic pain: Secondary | ICD-10-CM

## 2023-09-25 NOTE — Telephone Encounter (Signed)
 Please get her in asap  If she can't with me see if she can get in with Dr. Geralyn Knee.  Leg looks pretty rough.

## 2023-09-25 NOTE — Telephone Encounter (Signed)
 Spoke with pt, she has been scheduled to see Tabitha on 09/26/23 at 1120.

## 2023-09-26 ENCOUNTER — Ambulatory Visit: Admitting: Family

## 2023-09-26 ENCOUNTER — Ambulatory Visit (INDEPENDENT_AMBULATORY_CARE_PROVIDER_SITE_OTHER)
Admission: RE | Admit: 2023-09-26 | Discharge: 2023-09-26 | Disposition: A | Discharge status: Home / Self Care | Source: Ambulatory Visit | Attending: Family

## 2023-09-26 ENCOUNTER — Encounter: Payer: Self-pay | Admitting: Family

## 2023-09-26 VITALS — BP 140/70 | HR 93 | Temp 98.0°F | Ht 68.0 in | Wt 195.4 lb

## 2023-09-26 DIAGNOSIS — S99911A Unspecified injury of right ankle, initial encounter: Secondary | ICD-10-CM | POA: Diagnosis not present

## 2023-09-26 DIAGNOSIS — S99921A Unspecified injury of right foot, initial encounter: Secondary | ICD-10-CM

## 2023-09-26 DIAGNOSIS — S99922A Unspecified injury of left foot, initial encounter: Secondary | ICD-10-CM | POA: Diagnosis not present

## 2023-09-26 DIAGNOSIS — S99912A Unspecified injury of left ankle, initial encounter: Secondary | ICD-10-CM

## 2023-09-26 NOTE — Patient Instructions (Signed)
  Try some over the counter voltaren gel (diclofenac)

## 2023-09-26 NOTE — Progress Notes (Signed)
 Established Patient Office Visit  Subjective:   Patient ID: Erin Dorsey, female    DOB: 1971/01/27  Age: 53 y.o. MRN: [id number]  CC:  Chief Complaint  Patient presents with   Leg Swelling   Foot Swelling    HPI: Erin Dorsey is a 53 y.o. female presenting on 09/26/2023 for Leg Swelling and Foot Swelling  She fell going up her stairs and landed on both with both of her feet tucked under her.  She states she had a 'spell' going up her stairs where she jerks spontaneously.  She has had swelling in both of her ankles which is still present, she has pain along the right lateral side of her foot and her fourth and fifth toe on the left side. There was some bruising which has improved.  Hard to bear weight on her feet due to the pain.  Taking tylenol  and alternation without much relief.  No ice or heat to site.   Still following with neurology for her spasticity.       ROS: Negative unless specifically indicated above in HPI.   Relevant past medical history reviewed and updated as indicated.   Allergies and medications reviewed and updated.   Current Outpatient Medications:    amitriptyline  (ELAVIL ) 25 MG tablet, Take 1 tablet (25 mg total) by mouth at bedtime., Disp: 90 tablet, Rfl: 3   baclofen  (LIORESAL ) 10 MG tablet, TAKE 0.5 TABLETS BY MOUTH 3 TIMES DAILY AS NEEDED FOR MUSCLE SPASMS., Disp: 30 tablet, Rfl: 0   busPIRone  (BUSPAR ) 15 MG tablet, Take 1 tablet (15 mg total) by mouth 3 (three) times daily., Disp: 270 tablet, Rfl: 4   cetirizine  (ZYRTEC ) 10 MG tablet, Take 1 tablet (10 mg total) by mouth daily., Disp: 90 tablet, Rfl: 3   clotrimazole -betamethasone  (LOTRISONE ) cream, APPLY 1 APPLICATION TOPICALLY DAILY, Disp: 15 g, Rfl: 1   FLUoxetine  HCl 60 MG TABS, Take 60 mg by mouth daily., Disp: 90 tablet, Rfl: 3   fluticasone  (FLOVENT  HFA) 110 MCG/ACT inhaler, Inhale 1 puff into the lungs in the morning and at bedtime., Disp: 1 each, Rfl: 12   gabapentin   (NEURONTIN ) 400 MG capsule, Take 400 mg by mouth 3 (three) times daily., Disp: , Rfl:    Hydrocortisone  Acetate 1 % CREA, Apply 1 application  topically daily as needed (heat rash)., Disp: 15 g, Rfl: 0   hydrOXYzine  (ATARAX ) 25 MG tablet, TAKE 1 TABLET BY MOUTH EVERYDAY AT BEDTIME, Disp: 90 tablet, Rfl: 2   mupirocin  ointment (BACTROBAN ) 2 %, APPLY TO AFFECTED AREA TOPICALLY EVERY DAY, Disp: 22 g, Rfl: 0   nystatin  (MYCOSTATIN /NYSTOP ) powder, APPLY TO AFFECTED AREA 3 TIMES A DAY, Disp: 15 g, Rfl: 5   omeprazole  (PRILOSEC) 20 MG capsule, TAKE 1 CAPSULE BY MOUTH EVERY DAY, Disp: 90 capsule, Rfl: 1   ondansetron  (ZOFRAN ) 4 MG tablet, TAKE 1 TABLET BY MOUTH EVERY 8 HOURS AS NEEDED FOR NAUSEA AND VOMITING, Disp: 20 tablet, Rfl: 0   pravastatin (PRAVACHOL) 10 MG tablet, Take 10 mg by mouth at bedtime., Disp: , Rfl:    promethazine  (PHENERGAN ) 25 MG tablet, Take 25 mg by mouth every 6 (six) hours as needed for nausea or vomiting., Disp: , Rfl:    SUBOXONE 8-2 MG FILM, Take 1 Film by mouth 3 (three) times daily., Disp: , Rfl:    valACYclovir  (VALTREX ) 500 MG tablet, TAKE 2 TABLETS BY MOUTH EVERY DAY, Disp: 60 tablet, Rfl: 2   VENTOLIN  HFA 108 (90 Base) MCG/ACT  inhaler, TAKE 2 PUFFS BY MOUTH EVERY 6 HOURS AS NEEDED FOR WHEEZE OR SHORTNESS OF BREATH, Disp: 18 each, Rfl: 2  Allergies  Allergen Reactions   Amoxicillin Rash   Penicillins Rash    Objective:   BP (!) 140/70 (BP Location: Left Arm, Patient Position: Sitting, Cuff Size: Normal)   Pulse 93   Temp 98 F (36.7 C) (Temporal)   Ht 5\' 8"  (1.727 m)   Wt 195 lb 6 oz (88.6 kg)   LMP 06/23/2015   SpO2 95%   BMI 29.71 kg/m    Physical Exam Cardiovascular:     Comments: Bil ankle swelling with point tenderness  Right ankle with point tenderness to lower aspect of medial malleolus and right lateral side of foot   Left ankle with point tenderness of the entire malleolus and also tenderness with decreased rom to foot as well as 4th and 5th  metatarsal  Musculoskeletal:     Left foot: Decreased range of motion.     Assessment & Plan:  Injury of right ankle, initial encounter -     DG Ankle Complete Right; Future  Left ankle injury, initial encounter -     DG Ankle Complete Left; Future  Injury of right foot, initial encounter -     DG Foot Complete Right; Future  Foot injury, left, initial encounter -     DG Foot Complete Left; Future   Due to acute injury ordering xrays for lower extremities pending results to r/o acute fracture, arthritis, v other acute etiology.  Tylenol , NSAIDS prn.  Heat/ice to site.  Exercises as tolerated.    Follow up plan: Return if symptoms worsen or fail to improve.  Felicita Horns, FNP

## 2023-09-27 ENCOUNTER — Ambulatory Visit: Payer: Self-pay | Admitting: Family

## 2023-09-27 NOTE — Telephone Encounter (Signed)
 Spoke with Erin Dorsey. She is requesting medication to help with the swelling and pain in her feet and ankles. Erin Dorsey wanted to go ahead and schedule an appointment with Dr. Geralyn Knee. This has been scheduled for 10/03/23 at 1540.

## 2023-09-27 NOTE — Telephone Encounter (Signed)
 Can you please call to ask what she is wanting?

## 2023-09-27 NOTE — Progress Notes (Signed)
 noted

## 2023-10-01 DIAGNOSIS — R609 Edema, unspecified: Secondary | ICD-10-CM

## 2023-10-01 HISTORY — DX: Edema, unspecified: R60.9

## 2023-10-01 NOTE — Telephone Encounter (Signed)
 I ca't really give her any pain medication because she is taking suboxone.  Would advise that she alternate with tylenol  and ibuprofen .   Is the pain any better from over the weekend or do we need to refer her to orthopedist?

## 2023-10-02 ENCOUNTER — Telehealth: Payer: Self-pay

## 2023-10-02 ENCOUNTER — Other Ambulatory Visit (HOSPITAL_COMMUNITY): Payer: Self-pay

## 2023-10-02 NOTE — Telephone Encounter (Signed)
 Pharmacy Patient Advocate Encounter   Received notification from Onbase that prior authorization for FLUoxetine  HCl 60MG  tablets  is required/requested.   Insurance verification completed.   The patient is insured through [facility] .   Per test claim: PA required; PA submitted to above mentioned insurance via CoverMyMeds Key/confirmation #/[id number] [facility] Status is pending

## 2023-10-03 ENCOUNTER — Ambulatory Visit: Admitting: Family Medicine

## 2023-10-03 ENCOUNTER — Ambulatory Visit: Admitting: Family

## 2023-10-03 ENCOUNTER — Encounter: Payer: Self-pay | Admitting: Family Medicine

## 2023-10-03 ENCOUNTER — Other Ambulatory Visit (HOSPITAL_COMMUNITY): Payer: Self-pay

## 2023-10-03 VITALS — BP 100/70 | HR 78 | Temp 98.3°F | Ht 68.0 in | Wt 202.0 lb

## 2023-10-03 DIAGNOSIS — S93492A Sprain of other ligament of left ankle, initial encounter: Secondary | ICD-10-CM

## 2023-10-03 DIAGNOSIS — R600 Localized edema: Secondary | ICD-10-CM | POA: Diagnosis not present

## 2023-10-03 DIAGNOSIS — R0600 Dyspnea, unspecified: Secondary | ICD-10-CM | POA: Diagnosis not present

## 2023-10-03 NOTE — Progress Notes (Signed)
 Erin Dorsey T. Erin Ludlum, MD, CAQ Sports Medicine [facility] at [facility] [address] [place], [address]  Phone: [phone]  FAX: [phone]  Erin Dorsey - 53 y.o. female  MRN [id number]  Date of Birth: 04/18/1971  Date: 10/03/2023  PCP: Erin Horns, FNP  Referral: Erin Horns, FNP  Chief Complaint  Patient presents with   Foot Swelling   Ankle Pain   Subjective:   Erin Dorsey is a 53 y.o. very pleasant female patient with Body mass index is 30.71 kg/m. who presents with the following:  The patient presents with some ongoing foot and ankle pain, but actually after secondary questioning the primary issue she is having is lower extremity edema.  She did have a previous ankle injury and has some lateral ankle sprain.  This initially happened several weeks ago, and as time has progressed her symptoms have become less and less.  In terms of edema, she has no known history of renal disease, liver disease, or congestive heart failure.  She is only 53 years old.  Review of Systems is noted in the HPI, as appropriate  Objective:   BP 100/70   Pulse 78   Temp 98.3 F (36.8 C) (Temporal)   Ht 5\' 8"  (1.727 m)   Wt 202 lb (91.6 kg)   LMP 06/23/2015   SpO2 97%   BMI 30.71 kg/m   GEN: No acute distress; alert,appropriate. PULM: Breathing comfortably in no respiratory distress PSYCH: Normally interactive.   Left ankle: Mild tenderness at the ATFL and CFL, but the patient walks with no significant limp.  Bilateral ankle exam shows normal anatomy and no significant pain at the tibia, fibula, malleoli, calcaneus, midfoot, forefoot, toes, metatarsals, and remainder of the foot and ankle exam.    Laboratory and Imaging Data:  Assessment and Plan:     ICD-10-CM   1. Sprain of anterior talofibular ligament of left ankle, initial encounter  S93.492A     2. Bilateral lower extremity edema  R60.0 Basic metabolic panel with GFR     Hepatic function panel    Brain natriuretic peptide    Microalbumin / creatinine urine ratio    3. Dyspnea, unspecified type  R06.00 Basic metabolic panel with GFR    Hepatic function panel    Brain natriuretic peptide    Microalbumin / creatinine urine ratio     ATFL and CFL sprain is relatively benign.  She is not walking with any kind of limp.  She has mild lateral ankle pain only and excellent strength.  I do not think she needs to do anything additional malleolus and she can progress her activity as tolerated.  Bilateral lower extremity edema.  Do not really have a good answer for this.  I am going to check some basic labs.  Medication Management during today's office visit: No orders of the defined types were placed in this encounter.  There are no discontinued medications.  Orders placed today for conditions managed today: Orders Placed This Encounter  Procedures   Basic metabolic panel with GFR   Hepatic function panel   Brain natriuretic peptide   Microalbumin / creatinine urine ratio    Disposition: No follow-ups on file.  Dragon [employer] One speech-to-text software was used for transcription in this dictation.  Possible transcriptional errors can occur using Animal nutritionist.   Signed,  Erin Bye. Davyon Fisch, MD   Outpatient Encounter Medications as of 10/03/2023  Medication Sig   cyclobenzaprine  (FLEXERIL )  10 MG tablet Take 10 mg by mouth at bedtime.   amitriptyline  (ELAVIL ) 25 MG tablet Take 1 tablet (25 mg total) by mouth at bedtime.   baclofen  (LIORESAL ) 10 MG tablet TAKE 0.5 TABLETS BY MOUTH 3 TIMES DAILY AS NEEDED FOR MUSCLE SPASMS.   busPIRone  (BUSPAR ) 15 MG tablet Take 1 tablet (15 mg total) by mouth 3 (three) times daily.   cetirizine  (ZYRTEC ) 10 MG tablet Take 1 tablet (10 mg total) by mouth daily.   clotrimazole -betamethasone  (LOTRISONE ) cream APPLY 1 APPLICATION TOPICALLY DAILY   FLUoxetine  HCl 60 MG TABS Take 60 mg by mouth daily.   fluticasone  (FLOVENT   HFA) 110 MCG/ACT inhaler Inhale 1 puff into the lungs in the morning and at bedtime.   gabapentin  (NEURONTIN ) 400 MG capsule Take 400 mg by mouth 3 (three) times daily.   Hydrocortisone  Acetate 1 % CREA Apply 1 application  topically daily as needed (heat rash).   hydrOXYzine  (ATARAX ) 25 MG tablet TAKE 1 TABLET BY MOUTH EVERYDAY AT BEDTIME   mupirocin  ointment (BACTROBAN ) 2 % APPLY TO AFFECTED AREA TOPICALLY EVERY DAY   nystatin  (MYCOSTATIN /NYSTOP ) powder APPLY TO AFFECTED AREA 3 TIMES A DAY   omeprazole  (PRILOSEC) 20 MG capsule TAKE 1 CAPSULE BY MOUTH EVERY DAY   ondansetron  (ZOFRAN ) 4 MG tablet TAKE 1 TABLET BY MOUTH EVERY 8 HOURS AS NEEDED FOR NAUSEA AND VOMITING   pravastatin (PRAVACHOL) 10 MG tablet Take 10 mg by mouth at bedtime.   promethazine  (PHENERGAN ) 25 MG tablet Take 25 mg by mouth every 6 (six) hours as needed for nausea or vomiting.   SUBOXONE 8-2 MG FILM Take 1 Film by mouth 3 (three) times daily.   valACYclovir  (VALTREX ) 500 MG tablet TAKE 2 TABLETS BY MOUTH EVERY DAY   VENTOLIN  HFA 108 (90 Base) MCG/ACT inhaler TAKE 2 PUFFS BY MOUTH EVERY 6 HOURS AS NEEDED FOR WHEEZE OR SHORTNESS OF BREATH   No facility-administered encounter medications on file as of 10/03/2023.

## 2023-10-03 NOTE — Telephone Encounter (Signed)
 Pharmacy Patient Advocate Encounter  Received notification from [facility] that Prior Authorization for FLUoxetine  HCl 60MG  tablets  has been APPROVED from 10/02/23 to 10/01/24. Unable to obtain price due to refill too soon rejection, last fill date 10/02/23 next available fill date08/09/25   PA #/Case ID/Reference #: [id number]

## 2023-10-04 LAB — HEPATIC FUNCTION PANEL
ALT: 14 U/L (ref 0–35)
AST: 19 U/L (ref 0–37)
Albumin: 3.7 g/dL (ref 3.5–5.2)
Alkaline Phosphatase: 50 U/L (ref 39–117)
Bilirubin, Direct: 0 mg/dL (ref 0.0–0.3)
Total Bilirubin: 0.3 mg/dL (ref 0.2–1.2)
Total Protein: 6.4 g/dL (ref 6.0–8.3)

## 2023-10-04 LAB — BASIC METABOLIC PANEL WITH GFR
BUN: 8 mg/dL (ref 6–23)
CO2: 29 meq/L (ref 19–32)
Calcium: 8.8 mg/dL (ref 8.4–10.5)
Chloride: 104 meq/L (ref 96–112)
Creatinine, Ser: 0.66 mg/dL (ref 0.40–1.20)
GFR: 101.72 mL/min (ref >60.00)
Glucose, Bld: 88 mg/dL (ref 70–99)
Potassium: 4.3 meq/L (ref 3.5–5.1)
Sodium: 142 meq/L (ref 135–145)

## 2023-10-04 LAB — MICROALBUMIN / CREATININE URINE RATIO
Creatinine,U: 43 mg/dL
Microalb Creat Ratio: UNDETERMINED mg/g (ref 0.0–30.0)
Microalb, Ur: <0.7 mg/dL

## 2023-10-04 LAB — BRAIN NATRIURETIC PEPTIDE: Pro B Natriuretic peptide (BNP): 127 pg/mL — ABNORMAL HIGH (ref 0.0–100.0)

## 2023-10-05 ENCOUNTER — Encounter: Payer: Self-pay | Admitting: Family Medicine

## 2023-10-07 ENCOUNTER — Encounter: Payer: Self-pay | Admitting: Family Medicine

## 2023-10-08 ENCOUNTER — Ambulatory Visit: Payer: Self-pay | Admitting: Family Medicine

## 2023-10-11 NOTE — Patient Instructions (Addendum)
 Please continue using your CPAP regularly. While your insurance requires that you use CPAP at least 4 hours each night on 70% of the nights, I recommend, that you not skip any nights and use it throughout the night if you can. Getting used to CPAP and staying with the treatment long term does take time and patience and discipline. Untreated obstructive sleep apnea when it is moderate to severe can have an adverse impact on cardiovascular health and raise her risk for heart disease, arrhythmias, hypertension, congestive heart failure, stroke and diabetes. Untreated obstructive sleep apnea causes sleep disruption, nonrestorative sleep, and sleep deprivation. This can have an impact on your day to day functioning and cause daytime sleepiness and impairment of cognitive function, memory loss, mood disturbance, and problems focussing. Using CPAP regularly can improve these symptoms.  We will update supply orders, today. Please continue working on compliance. You can use your machine during the day to help you get used to therapy if you wish. Reach out to your DME, DME:  [employer], [phone] with any concerns with machine or supplies.   Follow up in 4-6 months
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
## 2023-10-15 ENCOUNTER — Ambulatory Visit (INDEPENDENT_AMBULATORY_CARE_PROVIDER_SITE_OTHER): Admitting: Family Medicine

## 2023-10-15 ENCOUNTER — Encounter: Payer: Self-pay | Admitting: Family Medicine

## 2023-10-15 VITALS — BP 115/75 | HR 73 | Ht 68.0 in | Wt 198.0 lb

## 2023-10-15 DIAGNOSIS — G253 Myoclonus: Secondary | ICD-10-CM | POA: Diagnosis not present

## 2023-10-15 DIAGNOSIS — G4733 Obstructive sleep apnea (adult) (pediatric): Secondary | ICD-10-CM

## 2023-10-15 DIAGNOSIS — F8081 Childhood onset fluency disorder: Secondary | ICD-10-CM

## 2023-10-15 NOTE — Progress Notes (Signed)
 Erin Dorsey

## 2023-10-22 ENCOUNTER — Encounter: Payer: Self-pay | Admitting: Family

## 2023-10-22 DIAGNOSIS — L304 Erythema intertrigo: Secondary | ICD-10-CM

## 2023-10-23 MED ORDER — CLOTRIMAZOLE-BETAMETHASONE 1-0.05 % EX CREA: 1.0000 | TOPICAL_CREAM | Freq: Every day | CUTANEOUS | 1 refills | Status: DC

## 2023-10-23 NOTE — Addendum Note (Signed)
 Addended by: CORWIN ANTU on: 10/23/2023 04:50 PM   Modules accepted: Orders

## 2023-10-24 ENCOUNTER — Other Ambulatory Visit: Payer: Self-pay | Admitting: Family

## 2023-10-24 DIAGNOSIS — R252 Cramp and spasm: Secondary | ICD-10-CM

## 2023-10-25 NOTE — Telephone Encounter (Signed)
 Is pt taking cylobenzaprine or baclofen  for her muscle spasms? Both are listed in her chart.   Please call don't message her, she is hard to understand with her mychart responses

## 2023-10-26 NOTE — Telephone Encounter (Signed)
Left message on VM for pt to call office

## 2023-11-02 NOTE — Progress Notes (Deleted)
 Referring Physician:  Corwin Antu, FNP [address] [place],  [place] [address]  Primary Physician:  Corwin Antu, FNP  History of Present Illness: 11/02/2023 [name] has a history of HTN, asthma, OSA, GERD, history of cervical CA, obesity, chronic pain, bipolar, hyperlipidemia.   History of chronic back and right leg pain with intermittent flare ups x years.   Last seen by me on 09/06/23. She has known lumbar spondylosis with vacuum disc at L5-S1 with mild bilateral lateral recess stenosis.   [facility] to [facility]- she had initial eval on 09/10/23. She was referred to PMR for injections. She was to continue SUBOXONE, baclofen , and neurontin  as prescribed by [facility].   She had bilateral L5-S1 TF ESI with Dr. Dodson on 09/27/23 with no improvement.   She had repeat bilateral L5-S1 TF ESI with celestone  on 10/18/23.   She is here for follow up.      She has constant right sided LBP with constant right posterior leg pain to her foot and intermittent left posterior leg pain foot. LBP = leg pain, right leg pain > left leg pain. She has burning in both feet. She has intermittent numbness and tingling in both legs. Pain is worse with standing and working in the house. She has weakness in both legs.   She is on SUBOXONE. She sees [facility]. She is also taking neurontin  and baclofen . She has some relief with these medications  She quit smoking in 2021.   Bowel/Bladder Dysfunction: none, some bowel urgency since having her gallbladder removed.   Conservative measures:  Physical therapy: [facility] initial eval 09/10/23 Multimodal medical therapy including regular antiinflammatories: baclofen , gabapentin , prednisone  Injections:  repeat bilateral L5-S1 TF ESI with celestone  on 10/18/23 L5-S1 TF ESI with Dr. Dodson on 09/27/23 with no improvement   Past Surgery: no spinal surgeries  Erin Dorsey has no symptoms of  cervical myelopathy.  The symptoms are causing a significant impact on the patient's life.   Review of Systems:  A 10 point review of systems is negative, except for the pertinent positives and negatives detailed in the HPI.  Past Medical History: Past Medical History:  Diagnosis Date   Allergy    Anxiety    Cervical cancer (HCC) 2011   stage 3   Chronic headaches    Depression    Edema 10/01/2023   Fatty liver    Frequent falls    GERD (gastroesophageal reflux disease)    Lumbar radiculopathy    Moderate obstructive sleep apnea 07/10/2023    Past Surgical History: Past Surgical History:  Procedure Laterality Date   ABDOMINAL HYSTERECTOMY     Total   ANTERIOR AND POSTERIOR REPAIR N/A 04/12/2020   Procedure: ANTERIOR (CYSTOCELE) AND POSTERIOR REPAIR (RECTOCELE);  Surgeon: Janit Alm Agent, MD;  Location: [facility] ORS;  Service: Gynecology;  Laterality: N/A;   BLADDER SUSPENSION N/A 04/12/2020   Procedure: [facility] PROCEDURE;  Surgeon: Janit Alm Agent, MD;  Location: [facility] ORS;  Service: Gynecology;  Laterality: N/A;  TOT   CHOLECYSTECTOMY N/A 11/30/2022   Procedure: LAPAROSCOPIC CHOLECYSTECTOMY;  Surgeon: Dasie Leonor CROME, MD;  Location[facility];  Service: General;  Laterality: N/A;   COLONOSCOPY WITH PROPOFOL  N/A 01/26/2021   Procedure: COLONOSCOPY WITH PROPOFOL ;  Surgeon: Dessa Reyes ORN, MD;  Location: [facility] ENDOSCOPY;  Service: Endoscopy;  Laterality: N/A;   COLPOSCOPY  2011 or 2012   stage 3-removed cancerous spot on the cervix  ESOPHAGOGASTRODUODENOSCOPY (EGD) WITH PROPOFOL  N/A 01/26/2021   Procedure: ESOPHAGOGASTRODUODENOSCOPY (EGD) WITH PROPOFOL ;  Surgeon: Dessa Reyes ORN, MD;  Location: [facility] ENDOSCOPY;  Service: Endoscopy;  Laterality: N/A;   EXCISION OF TONGUE LESION N/A 10/15/2019   Procedure: EXCISION OF TONGUE LESION;  Surgeon: Blair Mt, MD;  Location: [facility] ORS;  Service: ENT;  Laterality: N/A;   FOOT SURGERY Right    x 2-took knots out from bottom of the  foot-not sure of the technical name   LAPAROSCOPIC VAGINAL HYSTERECTOMY WITH SALPINGO OOPHORECTOMY Bilateral 04/12/2020   Procedure: LAPAROSCOPIC ASSISTED VAGINAL HYSTERECTOMY WITH SALPINGO OOPHORECTOMY;  Surgeon: Janit Alm Agent, MD;  Location: [facility] ORS;  Service: Gynecology;  Laterality: Bilateral;   PAROTIDECTOMY Right 10/15/2019   Procedure: PAROTIDECTOMY;  Surgeon: Blair Mt, MD;  Location: [facility] ORS;  Service: ENT;  Laterality: Right;   TUBAL LIGATION      Allergies: Allergies as of 11/08/2023 - Review Complete 10/15/2023  Allergen Reaction Noted   Amoxicillin Rash 10/02/2019   Penicillins Rash 07/22/2015    Medications: Outpatient Encounter Medications as of 11/08/2023  Medication Sig   amitriptyline  (ELAVIL ) 25 MG tablet Take 1 tablet (25 mg total) by mouth at bedtime.   baclofen  (LIORESAL ) 10 MG tablet TAKE 0.5 TABLETS BY MOUTH 3 TIMES DAILY AS NEEDED FOR MUSCLE SPASMS.   busPIRone  (BUSPAR ) 15 MG tablet Take 1 tablet (15 mg total) by mouth 3 (three) times daily.   cetirizine  (ZYRTEC ) 10 MG tablet Take 1 tablet (10 mg total) by mouth daily.   clotrimazole -betamethasone  (LOTRISONE ) cream Apply 1 Application topically daily.   cyclobenzaprine  (FLEXERIL ) 10 MG tablet Take 10 mg by mouth at bedtime.   FLUoxetine  HCl 60 MG TABS Take 60 mg by mouth daily.   fluticasone  (FLOVENT  HFA) 110 MCG/ACT inhaler Inhale 1 puff into the lungs in the morning and at bedtime.   gabapentin  (NEURONTIN ) 400 MG capsule Take 400 mg by mouth 3 (three) times daily.   Hydrocortisone  Acetate 1 % CREA Apply 1 application  topically daily as needed (heat rash).   hydrOXYzine  (ATARAX ) 25 MG tablet TAKE 1 TABLET BY MOUTH EVERYDAY AT BEDTIME   mupirocin  ointment (BACTROBAN ) 2 % APPLY TO AFFECTED AREA TOPICALLY EVERY DAY   nystatin  (MYCOSTATIN /NYSTOP ) powder APPLY TO AFFECTED AREA 3 TIMES A DAY   omeprazole  (PRILOSEC) 20 MG capsule TAKE 1 CAPSULE BY MOUTH EVERY DAY   ondansetron  (ZOFRAN ) 4 MG tablet TAKE 1  TABLET BY MOUTH EVERY 8 HOURS AS NEEDED FOR NAUSEA AND VOMITING   pravastatin (PRAVACHOL) 10 MG tablet Take 10 mg by mouth at bedtime.   promethazine  (PHENERGAN ) 25 MG tablet Take 25 mg by mouth every 6 (six) hours as needed for nausea or vomiting.   SUBOXONE 8-2 MG FILM Take 1 Film by mouth 3 (three) times daily.   valACYclovir  (VALTREX ) 500 MG tablet TAKE 2 TABLETS BY MOUTH EVERY DAY   VENTOLIN  HFA 108 (90 Base) MCG/ACT inhaler TAKE 2 PUFFS BY MOUTH EVERY 6 HOURS AS NEEDED FOR WHEEZE OR SHORTNESS OF BREATH   No facility-administered encounter medications on file as of 11/08/2023.    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  Awake, alert, oriented to person, place, and time.  Speech is clear and fluent. Fund of knowledge is appropriate.   Cranial Nerves: Pupils equal round and reactive to light.  Facial tone is symmetric.    Mild diffuse posterior lumbar tenderness, worse on right side.   No abnormal lesions on exposed skin.    Strength: Side Iliopsoas Quads Hamstring PF DF EHL  R 5 5 5 5 5 5   L 5 5 5 5 5 5    Reflexes are 2+ and symmetric at the patella and achilles.    Clonus is not present.   Bilateral lower extremity sensation is intact to light touch, but diminished in right leg compared to left.   She has a slow gait. She ambulates with a walker.    Medical Decision Making  Imaging: None  Assessment and Plan: Ms. Brickhouse has a history of chronic back and right leg pain with intermittent flare ups x years.   She has constant right sided LBP with constant right posterior leg pain to her foot and intermittent left posterior leg pain foot. LBP = leg pain, right leg pain > left leg pain. She has intermittent numbness and tingling in both legs with weakness.   She has known lumbar spondylosis with vacuum disc at L5-S1 with mild bilateral lateral recess stenosis.   Treatment options discussed with patient and following plan made:   - PT for lumbar spine. Orders to [facility] in [place].  - Referral to PMR at [facility] to discuss possible lumbar injections. She understands they will not take over her pain medications.  - Continue current medications from other providers including SUBOXONE, baclofen , and neurontin . She sees [facility].  - Given form for handicapped placard.  - She asks about permanent disability. Discussed that she would need to apply on her own.  - Follow up with me in 6-8 weeks and prn. If no relief with above, she may be a surgical candidate.   I spent a total of *** minutes in face-to-face and non-face-to-face activities related to this patient's care today including review of outside records, review of imaging, review of symptoms, physical exam, discussion of differential diagnosis, discussion of treatment options, and documentation.   Glade Boys PA-C Dept. of Neurosurgery

## 2023-11-06 ENCOUNTER — Encounter: Payer: Self-pay | Admitting: Orthopedic Surgery

## 2023-11-06 ENCOUNTER — Ambulatory Visit (INDEPENDENT_AMBULATORY_CARE_PROVIDER_SITE_OTHER): Admitting: Orthopedic Surgery

## 2023-11-06 VITALS — BP 122/76 | Ht 68.0 in | Wt 200.4 lb

## 2023-11-06 DIAGNOSIS — M48061 Spinal stenosis, lumbar region without neurogenic claudication: Secondary | ICD-10-CM

## 2023-11-06 DIAGNOSIS — M79605 Pain in left leg: Secondary | ICD-10-CM

## 2023-11-06 DIAGNOSIS — M47817 Spondylosis without myelopathy or radiculopathy, lumbosacral region: Secondary | ICD-10-CM

## 2023-11-06 DIAGNOSIS — M5416 Radiculopathy, lumbar region: Secondary | ICD-10-CM

## 2023-11-06 DIAGNOSIS — M47816 Spondylosis without myelopathy or radiculopathy, lumbar region: Secondary | ICD-10-CM

## 2023-11-06 DIAGNOSIS — M545 Low back pain, unspecified: Secondary | ICD-10-CM

## 2023-11-06 DIAGNOSIS — M79604 Pain in right leg: Secondary | ICD-10-CM

## 2023-11-06 DIAGNOSIS — R531 Weakness: Secondary | ICD-10-CM | POA: Diagnosis not present

## 2023-11-06 DIAGNOSIS — R296 Repeated falls: Secondary | ICD-10-CM

## 2023-11-06 DIAGNOSIS — M51362 Other intervertebral disc degeneration, lumbar region with discogenic back pain and lower extremity pain: Secondary | ICD-10-CM

## 2023-11-06 NOTE — Telephone Encounter (Signed)
 Please get pt in to be seen for acute swelling of feet.

## 2023-11-06 NOTE — Telephone Encounter (Signed)
 I called patient for clarification. She is needing Disability papers through [employer]. I informed her that she will need to reach out to the Social Security office and follow the steps that they will need to get the disability papers. Patient voiced understanding.

## 2023-11-06 NOTE — Progress Notes (Signed)
 Referring Physician:  Corwin Antu, FNP [address] [place],  [place] [address]  Primary Physician:  Corwin Antu, FNP  History of Present Illness: 11/06/2023 Ms. Delaynie Stetzer has a history of HTN, asthma, OSA, GERD, history of cervical CA, obesity, chronic pain, bipolar, hyperlipidemia.   History of chronic back and right leg pain with intermittent flare ups x years.   Last seen by me on 09/06/23. She has known lumbar spondylosis with vacuum disc at L5-S1 with mild bilateral lateral recess stenosis.   [facility] to [facility]- she had initial eval on 09/10/23. She was referred to PMR for injections. She was to continue SUBOXONE, baclofen , and neurontin  as prescribed by [facility].   She had bilateral L5-S1 TF ESI with Dr. Dodson on 09/27/23 with no improvement.   She had repeat bilateral L5-S1 TF ESI with celestone  on 10/18/23.   She is here for follow up.   She does not feel like anything is helping. She is scheduled for lumbar MBB tomorrow. Dr. Dodson also ordered an EMG of her bilateral lower extremities. She is seeing PCP for bilateral leg edema.   She has constant right sided LBP with constant bilateral posterior leg pain to her feet. She has burning in both feet. She has intermittent numbness and tingling in both legs. Pain is worse with standing and working in the house. She has weakness in both legs- this is worse. She's had multiple falls since her last visit.   She is on SUBOXONE. She sees [facility]. She is also taking neurontin  and baclofen .   She quit smoking in 2021.   Bowel/Bladder Dysfunction: none, some bowel urgency since having her gallbladder removed.   Conservative measures:  Physical therapy: [facility] initial eval 09/10/23 Multimodal medical therapy including regular antiinflammatories: baclofen , gabapentin , prednisone  Injections:  repeat bilateral L5-S1 TF ESI with celestone  on 10/18/23 L5-S1 TF ESI with  Dr. Dodson on 09/27/23 with no improvement   Past Surgery: no spinal surgeries  Suzen DELENA Gowers has no symptoms of cervical myelopathy.  The symptoms are causing a significant impact on the patient's life.   Review of Systems:  A 10 point review of systems is negative, except for the pertinent positives and negatives detailed in the HPI.  Past Medical History: Past Medical History:  Diagnosis Date   Allergy    Anxiety    Cervical cancer (HCC) 2011   stage 3   Chronic headaches    Depression    Edema 10/01/2023   Fatty liver    Frequent falls    GERD (gastroesophageal reflux disease)    Lumbar radiculopathy    Moderate obstructive sleep apnea 07/10/2023    Past Surgical History: Past Surgical History:  Procedure Laterality Date   ABDOMINAL HYSTERECTOMY     Total   ANTERIOR AND POSTERIOR REPAIR N/A 04/12/2020   Procedure: ANTERIOR (CYSTOCELE) AND POSTERIOR REPAIR (RECTOCELE);  Surgeon: Janit Alm Agent, MD;  Location: [facility] ORS;  Service: Gynecology;  Laterality: N/A;   BLADDER SUSPENSION N/A 04/12/2020   Procedure: [facility] PROCEDURE;  Surgeon: Janit Alm Agent, MD;  Location: [facility] ORS;  Service: Gynecology;  Laterality: N/A;  TOT   CHOLECYSTECTOMY N/A 11/30/2022   Procedure: LAPAROSCOPIC CHOLECYSTECTOMY;  Surgeon: Dasie Leonor CROME, MD;  Location[facility] OR;  Service: General;  Laterality: N/A;   COLONOSCOPY WITH PROPOFOL  N/A 01/26/2021   Procedure: COLONOSCOPY WITH PROPOFOL ;  Surgeon: Dessa Reyes ORN, MD;  Location: [facility] ENDOSCOPY;  Service: Endoscopy;  Laterality: N/A;   COLPOSCOPY  2011 or 2012   stage 3-removed cancerous spot on the cervix   ESOPHAGOGASTRODUODENOSCOPY (EGD) WITH PROPOFOL  N/A 01/26/2021   Procedure: ESOPHAGOGASTRODUODENOSCOPY (EGD) WITH PROPOFOL ;  Surgeon: Dessa Reyes ORN, MD;  Location: [facility] ENDOSCOPY;  Service: Endoscopy;  Laterality: N/A;   EXCISION OF TONGUE LESION N/A 10/15/2019   Procedure: EXCISION OF TONGUE LESION;  Surgeon: Blair Mt, MD;   Location: [facility] ORS;  Service: ENT;  Laterality: N/A;   FOOT SURGERY Right    x 2-took knots out from bottom of the foot-not sure of the technical name   LAPAROSCOPIC VAGINAL HYSTERECTOMY WITH SALPINGO OOPHORECTOMY Bilateral 04/12/2020   Procedure: LAPAROSCOPIC ASSISTED VAGINAL HYSTERECTOMY WITH SALPINGO OOPHORECTOMY;  Surgeon: Janit Alm Agent, MD;  Location: [facility] OR[facility];  Service: Gynecology;  Laterality: Bilateral;   PAROTIDECTOMY Right 10/15/2019   Procedure: PAROTIDECTOMY;  Surgeon: Blair Mt, MD;  Location: [facility] ORS;  Service: ENT;  Laterality: Right;   TUBAL LIGATION      Allergies: Allergies as of 11/06/2023 - Review Complete 11/06/2023  Allergen Reaction Noted   Amoxicillin Rash 10/02/2019   Penicillins Rash 07/22/2015    Medications: Outpatient Encounter Medications as of 11/06/2023  Medication Sig   amitriptyline  (ELAVIL ) 25 MG tablet Take 1 tablet (25 mg total) by mouth at bedtime.   baclofen  (LIORESAL ) 10 MG tablet TAKE 0.5 TABLETS BY MOUTH 3 TIMES DAILY AS NEEDED FOR MUSCLE SPASMS.   busPIRone  (BUSPAR ) 15 MG tablet Take 1 tablet (15 mg total) by mouth 3 (three) times daily.   cetirizine  (ZYRTEC ) 10 MG tablet Take 1 tablet (10 mg total) by mouth daily.   clotrimazole -betamethasone  (LOTRISONE ) cream Apply 1 Application topically daily.   cyclobenzaprine  (FLEXERIL ) 10 MG tablet Take 10 mg by mouth at bedtime.   FLUoxetine  HCl 60 MG TABS Take 60 mg by mouth daily.   fluticasone  (FLOVENT  HFA) 110 MCG/ACT inhaler Inhale 1 puff into the lungs in the morning and at bedtime.   gabapentin  (NEURONTIN ) 400 MG capsule Take 400 mg by mouth 3 (three) times daily.   Hydrocortisone  Acetate 1 % CREA Apply 1 application  topically daily as needed (heat rash).   hydrOXYzine  (ATARAX ) 25 MG tablet TAKE 1 TABLET BY MOUTH EVERYDAY AT BEDTIME   mupirocin  ointment (BACTROBAN ) 2 % APPLY TO AFFECTED AREA TOPICALLY EVERY DAY   nystatin  (MYCOSTATIN /NYSTOP ) powder APPLY TO AFFECTED AREA 3 TIMES A DAY    omeprazole  (PRILOSEC) 20 MG capsule TAKE 1 CAPSULE BY MOUTH EVERY DAY   ondansetron  (ZOFRAN ) 4 MG tablet TAKE 1 TABLET BY MOUTH EVERY 8 HOURS AS NEEDED FOR NAUSEA AND VOMITING   pravastatin (PRAVACHOL) 10 MG tablet Take 10 mg by mouth at bedtime.   promethazine  (PHENERGAN ) 25 MG tablet Take 25 mg by mouth every 6 (six) hours as needed for nausea or vomiting.   SUBOXONE 8-2 MG FILM Take 1 Film by mouth 3 (three) times daily.   valACYclovir  (VALTREX ) 500 MG tablet TAKE 2 TABLETS BY MOUTH EVERY DAY   VENTOLIN  HFA 108 (90 Base) MCG/ACT inhaler TAKE 2 PUFFS BY MOUTH EVERY 6 HOURS AS NEEDED FOR WHEEZE OR SHORTNESS OF BREATH   No facility-administered encounter medications on file as of 11/06/2023.    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   Drug use: Not Currently    Frequency: 1.0 times per week    Types: Marijuana    Comment: as needed    Family Medical History: Family History  Problem Relation Age of Onset   Hypertension Mother    Anxiety disorder Mother    Arthritis Mother    COPD Mother    Kidney disease Father        was on dialysis   Obesity Father    Uterine cancer Sister    Cervical cancer Sister    Breast cancer Paternal Aunt    Cervical cancer Maternal Grandmother    Cancer Maternal Grandmother    Varicose Veins Maternal Grandmother    Lung cancer Maternal Grandfather    Brain cancer Maternal Grandfather    Cancer Maternal Grandfather    Deafness Daughter    Thyroid  disease Daughter    ADD / ADHD Daughter    ADD / ADHD Brother     Physical Examination: Vitals:    11/06/23 0819  BP: 122/76    Awake, alert, oriented to person, place, and time.  Speech is clear and fluent. Fund of knowledge is appropriate.   Cranial Nerves: Pupils equal round and reactive to light.  Facial tone is symmetric.    No abnormal lesions on exposed skin.   Strength: Side Iliopsoas Quads Hamstring PF DF EHL  R 5 4 5 4 5 5   L 5 5 5 5 5 5    Reflexes are 2+ and symmetric at the patella and achilles.    Clonus is not present.   Bilateral lower extremity sensation is intact to light touch, but diminished mid calf to foot bilaterally.   She can heel stand on both feet. Can toe sand on left. Has some difficulty toe standing on right.   She has a slow gait. She ambulates with a walker.    Medical Decision Making  Imaging: None  Assessment and Plan: Ms. Ponder has a history of chronic back and right leg pain with intermittent flare ups x years.   She has constant right sided LBP with constant bilateral posterior leg pain to her feet. Weakness in legs has gotten worse and she's had multiple falls since her last visit.  She has known lumbar spondylosis with vacuum disc at L5-S1 with mild bilateral lateral recess stenosis.   No signs of myelopathy on exam. She has no upper extremity symptoms- no dexterity issues. She does have some weakness in right quads and PF on exam.   Treatment options discussed with patient and following plan made:   - Agree with EMG of bilateral lower extremities that Dr. Dodson ordered. She will let me know when this is done.  - Continue with PT for now.  - Okay to proceed with lumbar MBB tomorrow.  - Continue current medications from other providers including SUBOXONE, baclofen , and neurontin . She sees [facility].  - Follow up with PCP regarding LE edema.  - May need to follow up with one of my surgeons, but will see what EMG shows.  - For now she will f/u with me in 6 weeks but may need to change.   I spent a total of 30  minutes in face-to-face and non-face-to-face activities  related to this patient's care today including review of outside records, review of imaging, review of symptoms, physical exam, discussion of differential diagnosis, discussion of treatment options, and documentation.   Glade Boys PA-C Dept. of Neurosurgery

## 2023-11-06 NOTE — Patient Instructions (Signed)
 It was so nice to see you today. Thank you so much for coming in.    Follow up tomorrow for your lumbar blocks with Dr. Dodson.   Continue with PT for your lumbar spine.   Please let me know when your EMG (nerve test) is done so I can get results. If you don't hear anything about this in next few days, call Dr. Dodson.   Continue on your medications from pain management.   If you don't see any improvement, I may want you to see one of my surgeons, but I want to see results of EMG first. Please keep me updated on your symptoms.   I will see you back in 6 weeks. Please do not hesitate to call if you have any questions or concerns. You can also message me in MyChart.   Glade Boys PA-C [phone]     The physicians and staff at [facility] at [facility] are committed to providing excellent care. You may receive a survey asking for feedback about your experience at our office. We value you your feedback and appreciate you taking the time to to fill it out. The [facility] leadership team is also available to discuss your experience in person, feel free to contact us  [phone].

## 2023-11-08 ENCOUNTER — Ambulatory Visit: Admitting: Orthopedic Surgery

## 2023-11-12 ENCOUNTER — Encounter: Payer: Self-pay | Admitting: Family

## 2023-11-12 ENCOUNTER — Ambulatory Visit: Admitting: Family

## 2023-11-12 VITALS — BP 110/60 | HR 74 | Temp 98.1°F | Ht 68.0 in | Wt 203.0 lb

## 2023-11-12 DIAGNOSIS — E538 Deficiency of other specified B group vitamins: Secondary | ICD-10-CM | POA: Diagnosis not present

## 2023-11-12 DIAGNOSIS — K130 Diseases of lips: Secondary | ICD-10-CM

## 2023-11-12 DIAGNOSIS — R7989 Other specified abnormal findings of blood chemistry: Secondary | ICD-10-CM | POA: Insufficient documentation

## 2023-11-12 DIAGNOSIS — L304 Erythema intertrigo: Secondary | ICD-10-CM | POA: Insufficient documentation

## 2023-11-12 DIAGNOSIS — R600 Localized edema: Secondary | ICD-10-CM | POA: Insufficient documentation

## 2023-11-12 DIAGNOSIS — R0609 Other forms of dyspnea: Secondary | ICD-10-CM | POA: Diagnosis not present

## 2023-11-12 LAB — CBC
HCT: 39.6 % (ref 36.0–46.0)
Hemoglobin: 13.2 g/dL (ref 12.0–15.0)
MCHC: 33.5 g/dL (ref 30.0–36.0)
MCV: 99 fl (ref 78.0–100.0)
Platelets: 201 K/uL (ref 150.0–400.0)
RBC: 3.99 Mil/uL (ref 3.87–5.11)
RDW: 14 % (ref 11.5–15.5)
WBC: 3.8 K/uL — ABNORMAL LOW (ref 4.0–10.5)

## 2023-11-12 LAB — BASIC METABOLIC PANEL WITH GFR
BUN: 7 mg/dL (ref 6–23)
CO2: 33 meq/L — ABNORMAL HIGH (ref 19–32)
Calcium: 8.8 mg/dL (ref 8.4–10.5)
Chloride: 101 meq/L (ref 96–112)
Creatinine, Ser: 0.72 mg/dL (ref 0.40–1.20)
GFR: 96.88 mL/min (ref >60.00)
Glucose, Bld: 87 mg/dL (ref 70–99)
Potassium: 3.8 meq/L (ref 3.5–5.1)
Sodium: 141 meq/L (ref 135–145)

## 2023-11-12 LAB — VITAMIN B12: Vitamin B-12: 419 pg/mL (ref 211–911)

## 2023-11-12 LAB — BRAIN NATRIURETIC PEPTIDE: Pro B Natriuretic peptide (BNP): 49 pg/mL (ref 0.0–100.0)

## 2023-11-12 MED ORDER — CLOTRIMAZOLE 1 % EX CREA
TOPICAL_CREAM | CUTANEOUS | 0 refills | Status: AC
Start: 2023-11-12 — End: ?

## 2023-11-12 MED ORDER — FUROSEMIDE 20 MG PO TABS: 20.0000 mg | ORAL_TABLET | Freq: Every day | ORAL | 3 refills | Status: DC

## 2023-11-12 MED ORDER — FUROSEMIDE 20 MG PO TABS: ORAL_TABLET | ORAL | 0 refills | Status: DC

## 2023-11-12 NOTE — Assessment & Plan Note (Signed)
 Echocardiogram ordered  R/o CHF  Pending results.  Discussed ER precautions

## 2023-11-12 NOTE — Assessment & Plan Note (Signed)
 Furosemide  trial  Compression stockings Elevation Watch salt in diet

## 2023-11-12 NOTE — Assessment & Plan Note (Signed)
 R/o CHF  Chest xray today repeat BNP  Furosemide  20 mg po every day x 5 days and then f/u five days Advised to use furosemide  prn > 3 pound weight gain in one day

## 2023-11-12 NOTE — Assessment & Plan Note (Signed)
 Ordered b12 pending results

## 2023-11-12 NOTE — Progress Notes (Signed)
 Established Patient Office Visit  Subjective:   Patient ID: Erin Dorsey, female    DOB: [birth year]  Age: 53 y.o. MRN: [id number]  CC:  Chief Complaint  Patient presents with   Leg Swelling    Bilateral leg and foot swelling flare up for the last couple days    Rash    Rash under and between breast for a couple weeks.  Has been mupirocin  cream and nystatin  powder. Has improved since using powder     HPI: Erin Dorsey is a 53 y.o. female presenting on 11/12/2023 for Leg Swelling (Bilateral leg and foot swelling flare up for the last couple days ) and Rash (Rash under and between breast for a couple weeks. /Has been mupirocin  cream and nystatin  powder. Has improved since using powder )   Bil lower ext mainly ankle swelling, has been ongoing for the last few months. Has not improved at all. She states will go down at times but then go right back to being swollen. She has pain bil lower extremities. She has noted that she feels more short of breath than usual and does report some chest pain but not in the last few weeks. She does state she has been wheezing and at times is hard to take a deep breath.   She was seen 6/4 by Dr. Watt BNP was drawn and was elevated at 127.   Positive ANA seen 05/2023 pt has upcoming consult appt in August 2025.  She does have a lot of joint pains and stiffness on a regular basis.   She does have a rash bil corners of her mouth and rash in between her breasts. She has been using nystatin  powder but it has not helped as much lately.   Wt Readings from Last 3 Encounters:  11/12/23 203 lb (92.1 kg)  11/06/23 200 lb 6 oz (90.9 kg)  10/15/23 198 lb (89.8 kg)          ROS: Negative unless specifically indicated above in HPI.   Relevant past medical history reviewed and updated as indicated.   Allergies and medications reviewed and updated.   Current Outpatient Medications:    amitriptyline  (ELAVIL ) 25 MG tablet, Take 1 tablet (25 mg  total) by mouth at bedtime., Disp: 90 tablet, Rfl: 3   baclofen  (LIORESAL ) 10 MG tablet, TAKE 0.5 TABLETS BY MOUTH 3 TIMES DAILY AS NEEDED FOR MUSCLE SPASMS., Disp: 30 tablet, Rfl: 0   busPIRone  (BUSPAR ) 15 MG tablet, Take 1 tablet (15 mg total) by mouth 3 (three) times daily., Disp: 270 tablet, Rfl: 4   cetirizine  (ZYRTEC ) 10 MG tablet, Take 1 tablet (10 mg total) by mouth daily., Disp: 90 tablet, Rfl: 3   clotrimazole  (CLOTRIMAZOLE  ANTI-FUNGAL) 1 % cream, Apply 1 application twice daily for two weeks, Disp: 30 g, Rfl: 0   cyclobenzaprine  (FLEXERIL ) 10 MG tablet, Take 10 mg by mouth at bedtime., Disp: , Rfl:    FLUoxetine  HCl 60 MG TABS, Take 60 mg by mouth daily., Disp: 90 tablet, Rfl: 3   fluticasone  (FLOVENT  HFA) 110 MCG/ACT inhaler, Inhale 1 puff into the lungs in the morning and at bedtime., Disp: 1 each, Rfl: 12   gabapentin  (NEURONTIN ) 400 MG capsule, Take 400 mg by mouth 3 (three) times daily., Disp: , Rfl:    Hydrocortisone  Acetate 1 % CREA, Apply 1 application  topically daily as needed (heat rash)., Disp: 15 g, Rfl: 0   hydrOXYzine  (ATARAX ) 25 MG tablet, TAKE 1 TABLET BY MOUTH  EVERYDAY AT BEDTIME, Disp: 90 tablet, Rfl: 2   mupirocin  ointment (BACTROBAN ) 2 %, APPLY TO AFFECTED AREA TOPICALLY EVERY DAY, Disp: 22 g, Rfl: 0   omeprazole  (PRILOSEC) 20 MG capsule, TAKE 1 CAPSULE BY MOUTH EVERY DAY, Disp: 90 capsule, Rfl: 1   ondansetron  (ZOFRAN ) 4 MG tablet, TAKE 1 TABLET BY MOUTH EVERY 8 HOURS AS NEEDED FOR NAUSEA AND VOMITING, Disp: 20 tablet, Rfl: 0   pravastatin (PRAVACHOL) 10 MG tablet, Take 10 mg by mouth at bedtime., Disp: , Rfl:    promethazine  (PHENERGAN ) 25 MG tablet, Take 25 mg by mouth every 6 (six) hours as needed for nausea or vomiting., Disp: , Rfl:    SUBOXONE 8-2 MG FILM, Take 1 Film by mouth 3 (three) times daily., Disp: , Rfl:    valACYclovir  (VALTREX ) 500 MG tablet, TAKE 2 TABLETS BY MOUTH EVERY DAY, Disp: 60 tablet, Rfl: 2   VENTOLIN  HFA 108 (90 Base) MCG/ACT inhaler,  TAKE 2 PUFFS BY MOUTH EVERY 6 HOURS AS NEEDED FOR WHEEZE OR SHORTNESS OF BREATH, Disp: 18 each, Rfl: 2   furosemide  (LASIX ) 20 MG tablet, Take one po every day for five days and then after take one tablet prn > 3 pounds weight gain in one day, Disp: 30 tablet, Rfl: 0  Allergies  Allergen Reactions   Amoxicillin Rash   Penicillins Rash    Objective:   BP 110/60   Pulse 74   Temp 98.1 F (36.7 C) (Oral)   Ht 5' 8 (1.727 m)   Wt 203 lb (92.1 kg)   LMP 06/23/2015   SpO2 94%   BMI 30.87 kg/m    Physical Exam Constitutional:      General: She is not in acute distress.    Appearance: Normal appearance. She is normal weight. She is not ill-appearing, toxic-appearing or diaphoretic.  HENT:     Head: Normocephalic.  Cardiovascular:     Rate and Rhythm: Normal rate and regular rhythm.     Pulses: Decreased pulses (due to ankle edema).          Dorsalis pedis pulses are 1+ on the right side and 1+ on the left side.       Posterior tibial pulses are 1+ on the right side and 1+ on the left side.  Pulmonary:     Effort: Pulmonary effort is normal.     Breath sounds: Examination of the right-lower field reveals decreased breath sounds. Examination of the left-lower field reveals decreased breath sounds. Decreased breath sounds present.  Musculoskeletal:        General: Normal range of motion.  Skin:    Findings: Rash (papular erythematic patches inferior and central chest bil with red satelite lesions) present.     Comments: Excoriation bil inner corners of lips   Neurological:     General: No focal deficit present.     Mental Status: She is alert and oriented to person, place, and time. Mental status is at baseline.  Psychiatric:        Mood and Affect: Mood normal.        Behavior: Behavior normal.        Thought Content: Thought content normal.        Judgment: Judgment normal.     Assessment & Plan:  DOE (dyspnea on exertion) Assessment & Plan: Echocardiogram ordered  R/o  CHF  Pending results.  Discussed ER precautions  Orders: -     ECHOCARDIOGRAM COMPLETE; Future -     Basic  metabolic panel with GFR -     CBC -     DG Chest 2 View; Future -     Furosemide ; Take one po every day for five days and then after take one tablet prn > 3 pounds weight gain in one day  Dispense: 30 tablet; Refill: 0  Elevated brain natriuretic peptide (BNP) level Assessment & Plan: R/o CHF  Chest xray today repeat BNP  Furosemide  20 mg po every day x 5 days and then f/u five days Advised to use furosemide  prn > 3 pound weight gain in one day   Orders: -     Brain natriuretic peptide  Pedal edema Assessment & Plan: Furosemide  trial  Compression stockings Elevation Watch salt in diet  Orders: -     Furosemide ; Take one po every day for five days and then after take one tablet prn > 3 pounds weight gain in one day  Dispense: 30 tablet; Refill: 0  Intertrigo Assessment & Plan: Failure lotrisone  and nystatin  powder  May have to consider diflucan  150 mg po every day x 2 weeks but will try consistent use of clotrimazole  1% 1 application daily bid x 2 weeks  Orders: -     Clotrimazole ; Apply 1 application twice daily for two weeks  Dispense: 30 g; Refill: 0  B12 deficiency Assessment & Plan: Ordered b12 pending results   Orders: -     Vitamin B12  Cheilitis Assessment & Plan: Rx clotrimazole  cream and mupirocin  ointment twice daily x 10 days  Staph vs fungal vs b12 def  Pending b12 labs      Follow up plan: Return in about 1 week (around 11/19/2023) for follow up pedal edema.  Ginger Patrick, FNP

## 2023-11-12 NOTE — Assessment & Plan Note (Signed)
 Failure lotrisone  and nystatin  powder  May have to consider diflucan  150 mg po every day x 2 weeks but will try consistent use of clotrimazole  1% 1 application daily bid x 2 weeks

## 2023-11-12 NOTE — Patient Instructions (Addendum)
  Xray today pending results.   Labs today.   Ordering an echocardiogram (ultrasound of the heart) if you do not hear anything in the next one or two weeks let me know please.   Trial furosemide  (water pill) once daily x 5 days follow up with me in one week. After that if weight gain > 3 pounds in one day take a pill. Take in the am because it will make you pee all day. Elevate feet wear compression socks.   I want you to try to use the clotrimazole  cream 1% twice daily for two weeks and then let me know if no improvement because then we'll need pills.   Also put this cream in the corners of your lips twice daily for 10 days to two weeks.  I would also advise using mupirocin  ointment twice daily as well to your lips

## 2023-11-12 NOTE — Assessment & Plan Note (Signed)
 Rx clotrimazole  cream and mupirocin  ointment twice daily x 10 days  Staph vs fungal vs b12 def  Pending b12 labs

## 2023-11-13 ENCOUNTER — Ambulatory Visit: Payer: Self-pay | Admitting: Family

## 2023-11-28 ENCOUNTER — Encounter: Payer: Self-pay | Admitting: Family

## 2023-11-28 DIAGNOSIS — R252 Cramp and spasm: Secondary | ICD-10-CM

## 2023-11-29 NOTE — Telephone Encounter (Signed)
 Please call pt for clarification  Hard to read mychart messages.   Advise pt cyclobenzaprine  and baclofen  are both the same class of medications, muscle relaxers so we would not want her taking both of them as it'd be taking double the medication.   Does she take both of them daily? How often does she take them?

## 2023-11-29 NOTE — Telephone Encounter (Signed)
 Spoke with pt asking for clarification of message and asking about cyclobenzaprine . Pt states she does not take both meds daily. Says she would take one and when those pills were done she would switch to the other med. However, pt states she prefers baclofen  and she only takes 1 tab daily.

## 2023-11-30 MED ORDER — BACLOFEN 10 MG PO TABS: 10.0000 mg | ORAL_TABLET | Freq: Two times a day (BID) | ORAL | 5 refills | Status: DC | PRN

## 2023-11-30 NOTE — Addendum Note (Signed)
 Addended by: CORWIN ANTU on: 11/30/2023 07:09 AM   Modules accepted: Orders

## 2023-12-08 ENCOUNTER — Other Ambulatory Visit: Payer: Self-pay | Admitting: Family

## 2023-12-08 DIAGNOSIS — A6000 Herpesviral infection of urogenital system, unspecified: Secondary | ICD-10-CM

## 2023-12-17 ENCOUNTER — Ambulatory Visit: Attending: Internal Medicine | Admitting: Internal Medicine

## 2023-12-17 ENCOUNTER — Encounter: Payer: Self-pay | Admitting: *Deleted

## 2023-12-17 ENCOUNTER — Encounter: Payer: Self-pay | Admitting: Internal Medicine

## 2023-12-17 VITALS — BP 123/78 | HR 67 | Resp 18 | Ht 69.0 in | Wt 202.9 lb

## 2023-12-17 DIAGNOSIS — R768 Other specified abnormal immunological findings in serum: Secondary | ICD-10-CM | POA: Insufficient documentation

## 2023-12-17 NOTE — Progress Notes (Signed)
 Office Visit Note  Patient: Erin Dorsey             Date of Birth: October 16, 1970           MRN: [id number]             PCP: Corwin Antu, FNP Referring: Corwin Antu, FNP Visit Date: 12/17/2023  Subjective:  New Patient (Initial Visit) (Immune system)   Discussed the use of AI scribe software for clinical note transcription with the patient, who gave verbal consent to proceed.  History of Present Illness   Erin Dorsey is a 53 year old female who presents for evaluation of positive SSA/Rho antibodies checked in association with multiple symptoms including joint pains and swelling, dryness, but also tremor and jerking movement difficulties. She is accompanied by her husband, [name].  She has been experiencing tremors and jerking movements since January, which occur suddenly and sometimes lead to falls. These episodes can happen unexpectedly and involve full-body jerking. She has had falls, including a recent one while climbing stairs, although she sometimes can catch herself when falling. She associates these episodes with her 'sonic nerve' issues.  For the past two months, she has noticed swelling in her feet, accompanied by tightness, tingling, and redness. No similar swelling in her hands. She is currently taking Lasix  and wears diabetic socks for this with some improvement.  She experiences chronic dryness in her eyes and mouth. She uses AT/lubricating eye drops for her dry eyes and manages her dry mouth by drinking fluids although most often prefers Sprite.  She experiences sensitivity to sunlight, which causes her to feel hot and have difficulty breathing. She also reports joint pain in her hips, back, and feet, with her feet swelling and turning red. She develops a recurring rash under her breast treated with topical nystatin .  She had previous removal of a benign mass on right side of her neck.  Does not report any other problems with major salivary gland or  cervical lymph node swelling that she has noticed.  Does not get any discoloration in fingertips with cold exposure.  No history of blood clots.  Activities of Daily Living:  Patient reports morning stiffness for all day.   Patient Reports nocturnal pain.  Difficulty dressing/grooming: Reports Difficulty climbing stairs: Reports Difficulty getting out of chair: Reports Difficulty using hands for taps, buttons, cutlery, and/or writing: Denies  Review of Systems  Constitutional:  Negative for fatigue.  HENT:  Positive for mouth sores and mouth dryness.   Eyes:  Positive for dryness.  Respiratory:  Positive for shortness of breath.   Cardiovascular:  Positive for palpitations. Negative for chest pain.  Gastrointestinal:  Negative for blood in stool, constipation and diarrhea.  Endocrine: Positive for increased urination.  Genitourinary:  Positive for involuntary urination.  Musculoskeletal:  Positive for joint pain, gait problem, joint pain, joint swelling, myalgias, muscle weakness, morning stiffness, muscle tenderness and myalgias.  Skin:  Positive for rash and sensitivity to sunlight. Negative for color change and hair loss.  Allergic/Immunologic: Positive for susceptible to infections.  Neurological:  Positive for dizziness and headaches.  Hematological:  Negative for swollen glands.  Psychiatric/Behavioral:  Positive for depressed mood and sleep disturbance. The patient is nervous/anxious.     PMFS History:  Patient Active Problem List   Diagnosis Date Noted   B12 deficiency 11/12/2023   Intertrigo 11/12/2023   Pedal edema 11/12/2023   Elevated brain natriuretic peptide (BNP) level 11/12/2023   DOE (dyspnea on  exertion) 11/12/2023   Positive ANA (antinuclear antibody) 07/16/2023   Abnormal EEG 07/16/2023   Moderate obstructive sleep apnea 07/10/2023   Stuttering 05/17/2023   Chronic right-sided low back pain with right-sided sciatica 05/07/2023   Ataxia 05/07/2023    Expressive aphasia 05/07/2023   Spasticity 05/07/2023   Weakness of right lower extremity 05/07/2023   Frequent falls 01/15/2023   Other fatigue 01/15/2023   Excessive daytime sleepiness 01/15/2023   History of marijuana use 11/28/2022   Vitamin B12 deficiency 11/28/2022   Hypokalemia 11/28/2022   Mild intermittent asthma without complication 11/28/2022   Chronic nausea 08/29/2022   Hyperglycemia 08/29/2022   Chronic bilateral low back pain with bilateral sciatica 08/29/2022   Cheilitis 08/29/2022   Gastroesophageal reflux disease with esophagitis without hemorrhage 08/29/2022   Bursitis of hip 08/29/2022   History of bipolar disorder 08/29/2022   Mixed hyperlipidemia 08/29/2022   Polypharmacy 08/29/2022   Recurrent moderate major depressive disorder with anxiety (HCC) 04/27/2021   Calculus of gallbladder without cholecystitis without obstruction 04/27/2021   Other chronic pain 04/27/2021   Seasonal allergic rhinitis due to pollen 08/17/2020   Generalized anxiety disorder 02/03/2020   Genital herpes simplex 01/12/2020   Hx of cervical cancer 08/06/2019   Obesity (BMI 30.0-34.9) 08/06/2019   Essential hypertension 08/06/2019    Past Medical History:  Diagnosis Date   Allergy    Anxiety    Cervical cancer (HCC) 2011   stage 3   Chronic headaches    Depression    Edema 10/01/2023   Fatty liver    Frequent falls    GERD (gastroesophageal reflux disease)    Lumbar radiculopathy    Moderate obstructive sleep apnea 07/10/2023    Family History  Problem Relation Age of Onset   Hypertension Mother    Anxiety disorder Mother    Arthritis Mother    COPD Mother    Kidney disease Father        was on dialysis   Obesity Father    Uterine cancer Sister    Cervical cancer Sister    ADD / ADHD Brother    Cervical cancer Maternal Grandmother    Cancer Maternal Grandmother    Varicose Veins Maternal Grandmother    Lung cancer Maternal Grandfather    Brain cancer Maternal  Grandfather    Cancer Maternal Grandfather    Deafness Daughter    Thyroid  disease Daughter    ADD / ADHD Daughter    Breast cancer Paternal Aunt    Past Surgical History:  Procedure Laterality Date   ABDOMINAL HYSTERECTOMY     Total   ANTERIOR AND POSTERIOR REPAIR N/A 04/12/2020   Procedure: ANTERIOR (CYSTOCELE) AND POSTERIOR REPAIR (RECTOCELE);  Surgeon: Janit Alm Agent, MD;  Location: [facility] ORS;  Service: Gynecology;  Laterality: N/A;   BLADDER SUSPENSION N/A 04/12/2020   Procedure: [facility] PROCEDURE;  Surgeon: Janit Alm Agent, MD;  Location: [facility] OR[facility];  Service: Gynecology;  Laterality: N/A;  TOT   CHOLECYSTECTOMY N/A 11/30/2022   Procedure: LAPAROSCOPIC CHOLECYSTECTOMY;  Surgeon: Dasie Leonor CROME, MD;  Location: [facility] OR;  Service: General;  Laterality: N/A;   COLONOSCOPY WITH PROPOFOL  N/A 01/26/2021   Procedure: COLONOSCOPY WITH PROPOFOL ;  Surgeon: Dessa Reyes ORN, MD;  Location: [facility];  Service: Endoscopy;  Laterality: N/A;   COLPOSCOPY  2011 or 2012   stage 3-removed cancerous spot on the cervix   ESOPHAGOGASTRODUODENOSCOPY (EGD) WITH PROPOFOL  N/A 01/26/2021   Procedure: ESOPHAGOGASTRODUODENOSCOPY (EGD) WITH PROPOFOL ;  Surgeon: Dessa Reyes ORN, MD;  Location: [facility];  Service: Endoscopy;  Laterality: N/A;   EXCISION OF TONGUE LESION N/A 10/15/2019   Procedure: EXCISION OF TONGUE LESION;  Surgeon: Blair Mt, MD;  Location: [facility] OR[facility];  Service: ENT;  Laterality: N/A;   FOOT SURGERY Right    x 2-took knots out from bottom of the foot-not sure of the technical name   LAPAROSCOPIC VAGINAL HYSTERECTOMY WITH SALPINGO OOPHORECTOMY Bilateral 04/12/2020   Procedure: LAPAROSCOPIC ASSISTED VAGINAL HYSTERECTOMY WITH SALPINGO OOPHORECTOMY;  Surgeon: Janit Alm Agent, MD;  Location: [facility];  Service: Gynecology;  Laterality: Bilateral;   PAROTIDECTOMY Right 10/15/2019   Procedure: PAROTIDECTOMY;  Surgeon: Blair Mt, MD;  Location: [facility] OR[facility];  Service: ENT;   Laterality: Right;   TUBAL LIGATION     Social History   Social History Narrative   08/06/19   From: [place] , came here to be close to daughter   Living: with husband [name] together since 2011 and married since 2021   Work: not currently, use to work at [employer]      Family: 9 children  - in [place]  - ok relationship - 1 grandchild / with curtistine has 21 step grandchildren      Enjoys: walk, watch movies, painting, cross stitch      Exercise: walking   Diet: eats whatever      Safety   Seat belts: Yes    Guns: No   Safe in relationships: Yes    Caffiene: sprite, no caffiene      Immunization History  Administered Date(s) Administered   Influenza Inj Mdck Quad With Preservative 03/31/2022   Influenza,inj,Quad PF,6+ Mos 01/07/2020   Tdap 08/06/2019     Objective: Vital Signs: BP 123/78 (BP Location: Right Arm, Patient Position: Sitting, Cuff Size: Normal)   Pulse 67   Resp 18   Ht 5' 9 (1.753 m)   Wt 202 lb 14.4 oz (92 kg)   LMP 06/23/2015   BMI 29.96 kg/m    Physical Exam HENT:     Mouth/Throat:     Mouth: Mucous membranes are dry.     Comments: Tongue with glossy erythema, loss of lingual papillae Eyes:     Conjunctiva/sclera: Conjunctivae normal.  Cardiovascular:     Rate and Rhythm: Normal rate and regular rhythm.  Pulmonary:     Effort: Pulmonary effort is normal.     Breath sounds: Normal breath sounds.  Lymphadenopathy:     Cervical: No cervical adenopathy.  Skin:    General: Skin is warm and dry.     Comments: Trace bilateral pitting edema with sensitivity to pressure over the ankle and lower leg  Neurological:     Mental Status: She is alert.  Psychiatric:        Mood and Affect: Mood normal.      Musculoskeletal Exam:  Shoulders full ROM no tenderness or swelling Elbows full ROM no tenderness or swelling Wrists full ROM no tenderness or swelling Fingers full ROM no tenderness or swelling Bilateral low back muscle tenderness to pressure  and extending to lateral hip, no radiation, tolerates normal internal and external rotation range of motion Knees full ROM, some tightness against full extension while seated    Investigation: No additional findings.  Imaging: No results found.  Recent Labs: Lab Results  Component Value Date   WBC 3.8 (L) 11/12/2023   HGB 13.2 11/12/2023   PLT 201.0 11/12/2023   NA 141 11/12/2023   K 3.8 11/12/2023   CL 101 11/12/2023   CO2 33 (  H) 11/12/2023   GLUCOSE 87 11/12/2023   BUN 7 11/12/2023   CREATININE 0.72 11/12/2023   BILITOT 0.3 10/03/2023   ALKPHOS 50 10/03/2023   AST 19 10/03/2023   ALT 14 10/03/2023   PROT 6.7 12/17/2023   ALBUMIN 3.7 10/03/2023   CALCIUM 8.8 11/12/2023   GFRAA >60 07/22/2015    Speciality Comments: No specialty comments available.  Procedures:  No procedures performed Allergies: Amoxicillin and Penicillins   Assessment / Plan:     Visit Diagnoses: Positive ANA (antinuclear antibody) - Plan: Sedimentation rate, C-reactive protein, C3 and C4, Serum protein electrophoresis with reflex Suspected Sjogren's syndrome with chronic dry mouth and dry eyes Positive SSA/Rho antibodies suggest Sjogren's syndrome. Chronic dry mouth and dry eyes present. No other systemic signs or symptoms particular indicative for autoimmune cause. Differential includes other dryness causes.  - Order blood tests for inflammatory markers. - Provided information on symptomatic treatments for dry mouth and dry eyes, including sugar-free gum, lozenges, biotin, and xylimelts. - Provided educational material on Sjogren's syndrome.  Chronic back, hip, and foot pain Chronic pain in back, hips, and feet. Foot pain associated with swelling. Hip pain bothersome at night.  Edema of lower extremities New onset edema in feet for two months, associated with redness and tingling. Managed with Lasix .  Tremor and episodic jerking movements with recurrent falls Tremor and episodic jerking  movements since January, leading to falls. Sjogrens related or overlap with several nerve conditions discussed but usually would expect more overt disease activity elsewhere too in that case. Neurologist referral due to abnormal test results this is pending for September.    Orders: Orders Placed This Encounter  Procedures   Sedimentation rate   C-reactive protein   C3 and C4   Serum protein electrophoresis with reflex   No orders of the defined types were placed in this encounter.    Follow-Up Instructions: Return in about 4 weeks (around 01/14/2024) for New pt ?Sjogrens f/u 64mo.   Lonni LELON Ester, MD  Note - This record has been created using [employer].  Chart creation errors have been sought, but may not always  have been located. Such creation errors do not reflect on  the standard of medical care.

## 2023-12-17 NOTE — Patient Instructions (Signed)
 I recommend symptom treatments for eye dryness including lubricating eye drops and can use gel or ointment based products for overnight.  Also consider use of humidifier at night during dry weather.  Use follow-up regularly with your eye doctor.  If symptoms worsen there are several types of medicated eyedrop that can help with the dryness or inflammation.  For chronic dry mouth is important to stay well-hydrated.  You can also use sugar-free gum or lozenges to stimulate the saliva production.  Biotene mouthwash or lozenges may also be helpful.  Products into XyliMelts also work to stimulate saliva production.  Continue following up with your dentist regularly because dryness problems can increase the risk of tooth and gum decay.  If symptoms get worse there are medications for stimulating tear and saliva production but will try all the other options first.

## 2023-12-19 LAB — PROTEIN ELECTROPHORESIS, SERUM, WITH REFLEX
Albumin ELP: 3.8 g/dL (ref 3.8–4.8)
Alpha 1: 0.3 g/dL (ref 0.2–0.3)
Alpha 2: 0.7 g/dL (ref 0.5–0.9)
Beta 2: 0.4 g/dL (ref 0.2–0.5)
Beta Globulin: 0.4 g/dL (ref 0.4–0.6)
Gamma Globulin: 1 g/dL (ref 0.8–1.7)
Total Protein: 6.7 g/dL (ref 6.1–8.1)

## 2023-12-19 LAB — SEDIMENTATION RATE: Sed Rate: 14 mm/h (ref 0–30)

## 2023-12-19 LAB — C3 AND C4
C3 Complement: 140 mg/dL (ref 83–193)
C4 Complement: 29 mg/dL (ref 15–57)

## 2023-12-19 LAB — C-REACTIVE PROTEIN: CRP: 21.6 mg/L — ABNORMAL HIGH (ref <8.0)

## 2023-12-21 NOTE — Progress Notes (Deleted)
 Referring Physician:  Corwin Antu, FNP [address] [place],  [place] [address]  Primary Physician:  Corwin Antu, FNP  History of Present Illness: 12/21/2023 [name] has a history of HTN, asthma, OSA, GERD, history of cervical CA, obesity, chronic pain, bipolar, hyperlipidemia.   History of chronic back and right leg pain with intermittent flare ups x years.   Last seen by me on 11/06/23. She has known lumbar spondylosis with vacuum disc at L5-S1 with mild bilateral lateral recess stenosis.   She was to continue SUBOXONE, baclofen , and neurontin  as prescribed by [facility].   She had bilateral L3-L5 MBB by Dr. Dodson on 11/08/23 with no improvement. She had bilateral SI joint injections on 12/07/23.    She was scheduled for bilateral LE EMG on 12/19/23- this was not done?***   She is here for follow up.   She does not feel like anything is helping. She is scheduled for lumbar MBB tomorrow. Dr. Dodson also ordered an EMG of her bilateral lower extremities. She is seeing PCP for bilateral leg edema.   She has constant right sided LBP with constant bilateral posterior leg pain to her feet. She has burning in both feet. She has intermittent numbness and tingling in both legs. Pain is worse with standing and working in the house. She has weakness in both legs- this is worse. She's had multiple falls since her last visit.   She is on SUBOXONE. She sees [facility]. She is also taking neurontin  and baclofen .   She quit smoking in 2021.   Bowel/Bladder Dysfunction: none, some bowel urgency since having her gallbladder removed.   Conservative measures:  Physical therapy: [facility] initial eval 09/10/23 Multimodal medical therapy including regular antiinflammatories: baclofen , gabapentin , prednisone  Injections:  - bilateral SI joint injections 12/07/23 - bilateral L3, L4 medial branch and dorsal rami blocks 11/08/2023 with no significant  relief  - repeat bilateral L5-S1 TF ESI with [facility]  on 10/18/23 - L5-S1 TF ESI with Dr. Dodson on 09/27/23 with no improvement   Past Surgery: no spinal surgeries  Erin Dorsey has no symptoms of cervical myelopathy.  The symptoms are causing a significant impact on the patient's life.   Review of Systems:  A 10 point review of systems is negative, except for the pertinent positives and negatives detailed in the HPI.  Past Medical History: Past Medical History:  Diagnosis Date   Allergy    Anxiety    Cervical cancer (HCC) 2011   stage 3   Chronic headaches    Depression    Edema 10/01/2023   Fatty liver    Frequent falls    GERD (gastroesophageal reflux disease)    Lumbar radiculopathy    Moderate obstructive sleep apnea 07/10/2023    Past Surgical History: Past Surgical History:  Procedure Laterality Date   ABDOMINAL HYSTERECTOMY     Total   ANTERIOR AND POSTERIOR REPAIR N/A 04/12/2020   Procedure: ANTERIOR (CYSTOCELE) AND POSTERIOR REPAIR (RECTOCELE);  Surgeon: Janit Alm Agent, MD;  Location: [facility] ORS;  Service: Gynecology;  Laterality: N/A;   BLADDER SUSPENSION N/A 04/12/2020   Procedure: [facility] PROCEDURE;  Surgeon: Janit Alm Agent, MD;  Location: [facility] ORS;  Service: Gynecology;  Laterality: N/A;  TOT   CHOLECYSTECTOMY N/A 11/30/2022   Procedure: LAPAROSCOPIC CHOLECYSTECTOMY;  Surgeon: Dasie Leonor CROME, MD;  Location: [facility] OR;  Service: General;  Laterality: N/A;   COLONOSCOPY WITH PROPOFOL  N/A 01/26/2021   Procedure: COLONOSCOPY WITH  PROPOFOL ;  Surgeon: Dessa Reyes ORN, MD;  Location: [facility] ENDOSCOPY;  Service: Endoscopy;  Laterality: N/A;   COLPOSCOPY  2011 or 2012   stage 3-removed cancerous spot on the cervix   ESOPHAGOGASTRODUODENOSCOPY (EGD) WITH PROPOFOL  N/A 01/26/2021   Procedure: ESOPHAGOGASTRODUODENOSCOPY (EGD) WITH PROPOFOL ;  Surgeon: Dessa Reyes ORN, MD;  Location: [facility] ENDOSCOPY;  Service: Endoscopy;  Laterality: N/A;   EXCISION OF TONGUE  LESION N/A 10/15/2019   Procedure: EXCISION OF TONGUE LESION;  Surgeon: Blair Mt, MD;  Location: [facility] ORS;  Service: ENT;  Laterality: N/A;   FOOT SURGERY Right    x 2-took knots out from bottom of the foot-not sure of the technical name   LAPAROSCOPIC VAGINAL HYSTERECTOMY WITH SALPINGO OOPHORECTOMY Bilateral 04/12/2020   Procedure: LAPAROSCOPIC ASSISTED VAGINAL HYSTERECTOMY WITH SALPINGO OOPHORECTOMY;  Surgeon: Janit Alm Agent, MD;  Location: [facility] ORS;  Service: Gynecology;  Laterality: Bilateral;   PAROTIDECTOMY Right 10/15/2019   Procedure: PAROTIDECTOMY;  Surgeon: Blair Mt, MD;  Location: [facility] ORS;  Service: ENT;  Laterality: Right;   TUBAL LIGATION      Allergies: Allergies as of 12/26/2023 - Review Complete 12/17/2023  Allergen Reaction Noted   Amoxicillin Rash 10/02/2019   Penicillins Rash 07/22/2015    Medications: Outpatient Encounter Medications as of 12/26/2023  Medication Sig   amitriptyline  (ELAVIL ) 25 MG tablet Take 1 tablet (25 mg total) by mouth at bedtime.   baclofen  (LIORESAL ) 10 MG tablet Take 1 tablet (10 mg total) by mouth 2 (two) times daily as needed for muscle spasms.   busPIRone  (BUSPAR ) 15 MG tablet Take 1 tablet (15 mg total) by mouth 3 (three) times daily.   cetirizine  (ZYRTEC ) 10 MG tablet Take 1 tablet (10 mg total) by mouth daily.   clotrimazole  (CLOTRIMAZOLE  ANTI-FUNGAL) 1 % cream Apply 1 application twice daily for two weeks   FLUoxetine  HCl 60 MG TABS Take 60 mg by mouth daily.   fluticasone  (FLOVENT  HFA) 110 MCG/ACT inhaler Inhale 1 puff into the lungs in the morning and at bedtime.   furosemide  (LASIX ) 20 MG tablet Take one po every day for five days and then after take one tablet prn > 3 pounds weight gain in one day   gabapentin  (NEURONTIN ) 400 MG capsule Take 400 mg by mouth 3 (three) times daily.   Hydrocortisone  Acetate 1 % CREA Apply 1 application  topically daily as needed (heat rash).   hydrOXYzine  (ATARAX ) 25 MG tablet TAKE 1  TABLET BY MOUTH EVERYDAY AT BEDTIME   mupirocin  ointment (BACTROBAN ) 2 % APPLY TO AFFECTED AREA TOPICALLY EVERY DAY   nystatin  (MYCOSTATIN /NYSTOP ) powder Apply topically 3 (three) times daily.   omeprazole  (PRILOSEC) 20 MG capsule TAKE 1 CAPSULE BY MOUTH EVERY DAY   ondansetron  (ZOFRAN ) 4 MG tablet TAKE 1 TABLET BY MOUTH EVERY 8 HOURS AS NEEDED FOR NAUSEA AND VOMITING   pravastatin (PRAVACHOL) 10 MG tablet Take 10 mg by mouth at bedtime.   promethazine  (PHENERGAN ) 25 MG tablet Take 25 mg by mouth every 6 (six) hours as needed for nausea or vomiting.   SUBOXONE 8-2 MG FILM Take 1 Film by mouth 3 (three) times daily.   valACYclovir  (VALTREX ) 500 MG tablet TAKE 2 TABLETS BY MOUTH EVERY DAY   VENTOLIN  HFA 108 (90 Base) MCG/ACT inhaler TAKE 2 PUFFS BY MOUTH EVERY 6 HOURS AS NEEDED FOR WHEEZE OR SHORTNESS OF BREATH   No facility-administered encounter medications on file as of 12/26/2023.    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   Drug use: Not Currently    Frequency: 1.0 times per week    Types: Marijuana    Comment: as needed    Family Medical History: Family History  Problem Relation Age of Onset   Hypertension Mother    Anxiety disorder Mother    Arthritis Mother    COPD Mother    Kidney disease Father        was on dialysis   Obesity Father    Uterine cancer Sister    Cervical cancer Sister    ADD / ADHD Brother    Cervical cancer Maternal Grandmother    Cancer Maternal Grandmother    Varicose Veins Maternal Grandmother    Lung cancer Maternal Grandfather     Brain cancer Maternal Grandfather    Cancer Maternal Grandfather    Deafness Daughter    Thyroid  disease Daughter    ADD / ADHD Daughter    Breast cancer Paternal Aunt     Physical Examination: There were no vitals filed for this visit.   Awake, alert, oriented to person, place, and time.  Speech is clear and fluent. Fund of knowledge is appropriate.   Cranial Nerves: Pupils equal round and reactive to light.  Facial tone is symmetric.    No abnormal lesions on exposed skin.   Strength: Side Iliopsoas Quads Hamstring PF DF EHL  R 5 4 5 4 5 5   L 5 5 5 5 5 5    Reflexes are 2+ and symmetric at the patella and achilles.    Clonus is not present.   Bilateral lower extremity sensation is intact to light touch, but diminished mid calf to foot bilaterally.   She can heel stand on both feet. Can toe sand on left. Has some difficulty toe standing on right.   She has a slow gait. She ambulates with a walker.    Medical Decision Making  Imaging: None  Assessment and Plan: Ms. Hilger has a history of chronic back and right leg pain with intermittent flare ups x years.   She has constant right sided LBP with constant bilateral posterior leg pain to her feet. Weakness in legs has gotten worse and she's had multiple falls since her last visit.  She has known lumbar spondylosis with vacuum disc at L5-S1 with mild bilateral lateral recess stenosis.   No signs of myelopathy on exam. She has no upper extremity symptoms- no dexterity issues. She does have some weakness in right quads and PF on exam.   Treatment options discussed with patient and following plan made:   - Agree with EMG of bilateral lower extremities that Dr. Dodson ordered. She will let me know when this is done.  - Continue with PT for now.  - Okay to proceed with lumbar MBB tomorrow.  - Continue current medications from other providers including SUBOXONE, baclofen , and neurontin . She sees [facility].   - Follow up with PCP regarding LE edema.  -  May need to follow up with one of my surgeons, but will see what EMG shows.  - For now she will f/u with me in 6 weeks but may need to change.   I spent a total of 30 minutes in face-to-face and non-face-to-face activities related to this patient's care today including review of outside records, review of imaging, review of symptoms, physical exam, discussion of differential diagnosis, discussion of treatment options, and documentation.   Glade Boys PA-C Dept. of Neurosurgery

## 2023-12-26 ENCOUNTER — Ambulatory Visit: Admitting: Orthopedic Surgery

## 2023-12-26 ENCOUNTER — Encounter: Payer: Self-pay | Admitting: Internal Medicine

## 2023-12-28 ENCOUNTER — Other Ambulatory Visit: Payer: Self-pay | Admitting: Family

## 2023-12-28 DIAGNOSIS — L304 Erythema intertrigo: Secondary | ICD-10-CM

## 2023-12-29 ENCOUNTER — Other Ambulatory Visit: Payer: Self-pay | Admitting: Family

## 2023-12-29 DIAGNOSIS — F411 Generalized anxiety disorder: Secondary | ICD-10-CM

## 2023-12-29 DIAGNOSIS — F331 Major depressive disorder, recurrent, moderate: Secondary | ICD-10-CM

## 2024-01-06 ENCOUNTER — Encounter: Payer: Self-pay | Admitting: Family

## 2024-01-15 ENCOUNTER — Ambulatory Visit: Admitting: Family

## 2024-01-15 ENCOUNTER — Telehealth: Payer: Self-pay

## 2024-01-15 ENCOUNTER — Ambulatory Visit (INDEPENDENT_AMBULATORY_CARE_PROVIDER_SITE_OTHER)
Admission: RE | Admit: 2024-01-15 | Discharge: 2024-01-15 | Disposition: A | Discharge status: Home / Self Care | Source: Ambulatory Visit | Attending: Family

## 2024-01-15 ENCOUNTER — Encounter: Payer: Self-pay | Admitting: Family

## 2024-01-15 ENCOUNTER — Ambulatory Visit: Payer: Self-pay | Admitting: Family

## 2024-01-15 ENCOUNTER — Telehealth: Payer: Self-pay | Admitting: Family

## 2024-01-15 ENCOUNTER — Other Ambulatory Visit (HOSPITAL_COMMUNITY): Payer: Self-pay

## 2024-01-15 VITALS — BP 124/84 | HR 71 | Temp 98.0°F | Ht 68.0 in | Wt 203.6 lb

## 2024-01-15 DIAGNOSIS — R600 Localized edema: Secondary | ICD-10-CM

## 2024-01-15 DIAGNOSIS — R051 Acute cough: Secondary | ICD-10-CM

## 2024-01-15 DIAGNOSIS — R7303 Prediabetes: Secondary | ICD-10-CM

## 2024-01-15 DIAGNOSIS — D72819 Decreased white blood cell count, unspecified: Secondary | ICD-10-CM | POA: Diagnosis not present

## 2024-01-15 DIAGNOSIS — E66811 Obesity, class 1: Secondary | ICD-10-CM

## 2024-01-15 DIAGNOSIS — B070 Plantar wart: Secondary | ICD-10-CM | POA: Diagnosis not present

## 2024-01-15 DIAGNOSIS — R0609 Other forms of dyspnea: Secondary | ICD-10-CM

## 2024-01-15 DIAGNOSIS — I10 Essential (primary) hypertension: Secondary | ICD-10-CM

## 2024-01-15 DIAGNOSIS — M79605 Pain in left leg: Secondary | ICD-10-CM

## 2024-01-15 DIAGNOSIS — G4733 Obstructive sleep apnea (adult) (pediatric): Secondary | ICD-10-CM

## 2024-01-15 DIAGNOSIS — E876 Hypokalemia: Secondary | ICD-10-CM

## 2024-01-15 DIAGNOSIS — E782 Mixed hyperlipidemia: Secondary | ICD-10-CM

## 2024-01-15 DIAGNOSIS — M79604 Pain in right leg: Secondary | ICD-10-CM | POA: Diagnosis not present

## 2024-01-15 LAB — CBC
HCT: 38.5 % (ref 36.0–46.0)
Hemoglobin: 12.8 g/dL (ref 12.0–15.0)
MCHC: 33.1 g/dL (ref 30.0–36.0)
MCV: 100 fl (ref 78.0–100.0)
Platelets: 208 K/uL (ref 150.0–400.0)
RBC: 3.85 Mil/uL — ABNORMAL LOW (ref 3.87–5.11)
RDW: 14.9 % (ref 11.5–15.5)
WBC: 3.9 K/uL — ABNORMAL LOW (ref 4.0–10.5)

## 2024-01-15 LAB — BRAIN NATRIURETIC PEPTIDE: Pro B Natriuretic peptide (BNP): 27 pg/mL (ref 0.0–100.0)

## 2024-01-15 MED ORDER — POTASSIUM CHLORIDE CRYS ER 10 MEQ PO TBCR: 10.0000 meq | EXTENDED_RELEASE_TABLET | Freq: Two times a day (BID) | ORAL | 0 refills | Status: DC

## 2024-01-15 MED ORDER — WEGOVY 1 MG/0.5ML ~~LOC~~ SOAJ: 1.0000 mg | SUBCUTANEOUS | 0 refills | Status: DC

## 2024-01-15 MED ORDER — WEGOVY 0.5 MG/0.5ML ~~LOC~~ SOAJ: SUBCUTANEOUS | 0 refills | Status: DC

## 2024-01-15 MED ORDER — WEGOVY 0.25 MG/0.5ML ~~LOC~~ SOAJ: SUBCUTANEOUS | 0 refills | Status: DC

## 2024-01-15 NOTE — Telephone Encounter (Signed)
 Pharmacy Patient Advocate Encounter   Received notification from Pt Calls Messages that prior authorization for Wegovy  0.25 is required/requested.   Insurance verification completed.   The patient is insured through [facility] MEDICAID .   Per test claim: PA required; PA submitted to above mentioned insurance via Latent Key/confirmation #/EOC [id number] Status is pending

## 2024-01-15 NOTE — Telephone Encounter (Signed)
 Pharmacy Patient Advocate Encounter  Received notification from [facility] MEDICAID that Prior Authorization for Wegovy  0.25 has been APPROVED from 01/15/24 to 07/14/24. Ran test claim, Copay is $4.00. This test claim was processed through [facility]- copay amounts may vary at other pharmacies due to pharmacy/plan contracts, or as the patient moves through the different stages of their insurance plan. Approved using OSA as reason with sleep study for support.   PA #/Case ID/Reference #: [id number]

## 2024-01-15 NOTE — Telephone Encounter (Signed)
 Start prior auth for wegovy 

## 2024-01-15 NOTE — Progress Notes (Signed)
 Established Patient Office Visit  Subjective:      CC:  Chief Complaint  Patient presents with   Acute Visit    Bilateral feet swelling    HPI: Erin Dorsey is a 53 y.o. female presenting on 01/15/2024 for Acute Visit (Bilateral feet swelling) .  Discussed the use of AI scribe software for clinical note transcription with the patient, who gave verbal consent to proceed.  History of Present Illness Erin Dorsey is a 53 year old female who presents with bilateral leg swelling and shortness of breath.  She has experienced significant swelling in her right leg for the past three days, with no recent injury or twisting of the ankle. The swelling fluctuates, sometimes lessening and then increasing again. She takes a diuretic every night, which increases urination but does not improve the swelling. Her legs hurt due to the swelling, and she uses compression stockings as regular socks cause her feet to swell more.  There is a slight increase in shortness of breath over the past few weeks, particularly when walking. No chest pain or palpitations are present. She reports a mild increase in coughing but does not feel sick.  Her past medical history includes prediabetes, sleep apnea, a bone spur at the base of her pinky toe, and plantar warts on her right foot. She has been active, attending physical therapy once or twice a week since March. She finds the CPAP machine for sleep apnea difficult to use as it 'shortens out my breath a lot.'         Social history:  Relevant past medical, surgical, family and social history reviewed and updated as indicated. Interim medical history since our last visit reviewed.  Allergies and medications reviewed and updated.  DATA REVIEWED: CHART IN EPIC     ROS: Negative unless specifically indicated above in HPI.    Current Outpatient Medications:    amitriptyline  (ELAVIL ) 25 MG tablet, Take 1 tablet (25 mg total) by mouth at  bedtime., Disp: 90 tablet, Rfl: 3   baclofen  (LIORESAL ) 10 MG tablet, Take 1 tablet (10 mg total) by mouth 2 (two) times daily as needed for muscle spasms., Disp: 30 tablet, Rfl: 5   busPIRone  (BUSPAR ) 15 MG tablet, Take 1 tablet (15 mg total) by mouth 3 (three) times daily., Disp: 270 tablet, Rfl: 4   cetirizine  (ZYRTEC ) 10 MG tablet, Take 1 tablet (10 mg total) by mouth daily., Disp: 90 tablet, Rfl: 3   clotrimazole  (CLOTRIMAZOLE  ANTI-FUNGAL) 1 % cream, Apply 1 application twice daily for two weeks, Disp: 30 g, Rfl: 0   FLUoxetine  HCl 60 MG TABS, TAKE 60 MG BY MOUTH DAILY., Disp: 90 tablet, Rfl: 3   fluticasone  (FLOVENT  HFA) 110 MCG/ACT inhaler, Inhale 1 puff into the lungs in the morning and at bedtime., Disp: 1 each, Rfl: 12   furosemide  (LASIX ) 20 MG tablet, Take one po every day for five days and then after take one tablet prn > 3 pounds weight gain in one day, Disp: 30 tablet, Rfl: 0   gabapentin  (NEURONTIN ) 400 MG capsule, Take 400 mg by mouth 3 (three) times daily., Disp: , Rfl:    Hydrocortisone  Acetate 1 % CREA, Apply 1 application  topically daily as needed (heat rash)., Disp: 15 g, Rfl: 0   hydrOXYzine  (ATARAX ) 25 MG tablet, TAKE 1 TABLET BY MOUTH EVERYDAY AT BEDTIME, Disp: 90 tablet, Rfl: 2   mupirocin  ointment (BACTROBAN ) 2 %, APPLY TO AFFECTED AREA TOPICALLY EVERY DAY, Disp:  22 g, Rfl: 0   nystatin  (MYCOSTATIN /NYSTOP ) powder, APPLY TO AFFECTED AREA 3 TIMES A DAY, Disp: 15 g, Rfl: 5   omeprazole  (PRILOSEC) 20 MG capsule, TAKE 1 CAPSULE BY MOUTH EVERY DAY, Disp: 90 capsule, Rfl: 1   ondansetron  (ZOFRAN ) 4 MG tablet, TAKE 1 TABLET BY MOUTH EVERY 8 HOURS AS NEEDED FOR NAUSEA AND VOMITING, Disp: 20 tablet, Rfl: 0   pravastatin (PRAVACHOL) 10 MG tablet, Take 10 mg by mouth at bedtime., Disp: , Rfl:    promethazine  (PHENERGAN ) 25 MG tablet, Take 25 mg by mouth every 6 (six) hours as needed for nausea or vomiting., Disp: , Rfl:    semaglutide -weight management (WEGOVY ) 0.25 MG/0.5ML SOAJ SQ  injection, Start 0.25 mg qweek for four weeks, then increase to 0.5 mg dose qweek, Disp: 2 mL, Rfl: 0   [START ON 02/05/2024] semaglutide -weight management (WEGOVY ) 0.5 MG/0.5ML SOAJ SQ injection, Inject 0.5 mg [place] weekly, Disp: 2 mL, Rfl: 0   [START ON 02/26/2024] semaglutide -weight management (WEGOVY ) 1 MG/0.5ML SOAJ SQ injection, Inject 1 mg into the skin once a week., Disp: 6 mL, Rfl: 0   valACYclovir  (VALTREX ) 500 MG tablet, TAKE 2 TABLETS BY MOUTH EVERY DAY, Disp: 60 tablet, Rfl: 2   VENTOLIN  HFA 108 (90 Base) MCG/ACT inhaler, TAKE 2 PUFFS BY MOUTH EVERY 6 HOURS AS NEEDED FOR WHEEZE OR SHORTNESS OF BREATH, Disp: 18 each, Rfl: 2        Objective:        BP 124/84 (BP Location: Left Arm, Patient Position: Sitting, Cuff Size: Large)   Pulse 71   Temp 98 F (36.7 C) (Temporal)   Ht 5' 8 (1.727 m)   Wt 203 lb 9.6 oz (92.4 kg)   LMP 06/23/2015   SpO2 96%   BMI 30.96 kg/m   Physical Exam EXTREMITIES: Right leg swollen. Pulse palpable, but painful on examination. MUSCULOSKELETAL: Plantar wart on right foot.  Wt Readings from Last 3 Encounters:  01/15/24 203 lb 9.6 oz (92.4 kg)  12/17/23 202 lb 14.4 oz (92 kg)  11/12/23 203 lb (92.1 kg)    Physical Exam Vitals reviewed.  Constitutional:      Appearance: Normal appearance. She is obese.  Cardiovascular:     Rate and Rhythm: Normal rate and regular rhythm.     Comments: Unable to assess lower ext pulses due to edema Pulmonary:     Effort: No respiratory distress.     Breath sounds: Decreased air movement (right upper lobe) present.  Musculoskeletal:     Right lower leg: 2+ Pitting Edema present.     Left lower leg: 2+ Pitting Edema present.  Neurological:     Mental Status: She is alert.           Results LABS Potassium: Slightly low (01/01/2024) A1c: Prediabetic range  RADIOLOGY Right ankle x-ray: No acute findings, bone spur, arthritis (08/2023) Left ankle x-ray: No acute findings  (08/2023)  PROCEDURE Cryotherapy for plantar warts Description: Cryotherapy applied to plantar warts on the right foot. Three rounds of freezing performed on two warts.  Assessment & Plan:   Assessment and Plan Assessment & Plan Right leg swelling and pedal edema with shortness of breath, possible congestive heart failure Persistent right leg swelling and pedal edema with intermittent shortness of breath over the last few weeks. Furosemide  has not alleviated the swelling. Previous elevated BNP returned to normal, but further evaluation is needed to rule out congestive heart failure. No chest pain or palpitations reported. - Order chest  x-ray to evaluate for fluid on the lungs or heart issues. - Repeat lab work including potassium, liver, and kidney functions, and BNP. - Order arterial ultrasound to assess for circulatory disease. - Advise elevation of feet and use of compression stockings. - Encourage ambulation to prevent further swelling. - Echocardiogram scheduled for October 20th.  Sleep apnea, nonadherence to CPAP Reports difficulty using CPAP due to shortness of breath. Nonadherence could lead to heart arrhythmias if not managed. - Encourage contact with CPAP provider for adjustments or alternatives.  Hypokalemia Slightly low potassium levels, likely due to furosemide  use. No potassium supplementation currently being taken. - Repeat potassium levels with lab work. - Consider potassium supplementation based on lab results.  Prediabetes A1c indicates prediabetic range. Advised to monitor diet, particularly starches and sweets, and to practice portion control. Active and attending physical therapy. - Prescribe Wegovy  injection for weight management and prediabetes. - Submit prior authorization to insurance for Wegovy . - Advise dietary modifications including choosing whole grains over refined grains.  Obesity Seeking weight management options. Discussed Wegovy  as a treatment  option, which also aids in managing prediabetes. - Prescribe Wegovy  injection for weight management. - Submit prior authorization to insurance for Wegovy .  Plantar warts, right foot Presence of painful plantar warts on the right foot causing significant discomfort, especially when pressure is applied. - Perform cryotherapy on plantar warts. - Schedule follow-up in 2-3 weeks to assess improvement and determine if additional freezing is needed. Pt gave verbal consent prior. Scalpel used to par wart prior to treatment with cryotherapy. Cryotherapy blast applied until white bead formation x 2 rounds. Advised pt to apply Aquaphor otherwise no other treatment needed. Educated will likely start to resolve within 4-6 days, may blister and or slough off however monitor for s/s infection. Pt tolerated procedure well  May need repeat rounds of cryotherapy pt knows to schedule f/u if repeat session needed.  Handout given to pt as well.    Arthritis of right ankle/foot Previous x-rays showed arthritis and bone spur in the right ankle/foot. No acute findings.  Recording duration: 21 minutes The beneficiary does not have any FDA labeled contraindications to the requested agent including pregnancy, lactation, h/o medullary thyroid  cancer or multiple endocrine neoplasia type II.       Return in about 2 weeks (around 01/29/2024) for f/u pedal edema.     Ginger Patrick, MSN, APRN, FNP-C [facility]

## 2024-01-16 ENCOUNTER — Other Ambulatory Visit (HOSPITAL_COMMUNITY): Payer: Self-pay

## 2024-01-17 ENCOUNTER — Ambulatory Visit
Admission: RE | Admit: 2024-01-17 | Discharge: 2024-01-17 | Disposition: A | Discharge status: Home / Self Care | Source: Ambulatory Visit | Attending: Family | Admitting: Family

## 2024-01-17 DIAGNOSIS — M79605 Pain in left leg: Secondary | ICD-10-CM | POA: Insufficient documentation

## 2024-01-17 DIAGNOSIS — R600 Localized edema: Secondary | ICD-10-CM | POA: Diagnosis present

## 2024-01-17 DIAGNOSIS — M79604 Pain in right leg: Secondary | ICD-10-CM | POA: Insufficient documentation

## 2024-01-19 LAB — COMPREHENSIVE METABOLIC PANEL WITH GFR
ALT: 13 U/L (ref 0–35)
AST: 19 U/L (ref 0–37)
Albumin: 3.7 g/dL (ref 3.5–5.2)
Alkaline Phosphatase: 47 U/L (ref 39–117)
BUN: 8 mg/dL (ref 6–23)
CO2: 36 meq/L — ABNORMAL HIGH (ref 19–32)
Calcium: 9 mg/dL (ref 8.4–10.5)
Chloride: 101 meq/L (ref 96–112)
Creatinine, Ser: 0.57 mg/dL (ref 0.40–1.20)
GFR: 105.17 mL/min (ref >60.00)
Glucose, Bld: 68 mg/dL — ABNORMAL LOW (ref 70–99)
Potassium: 3.5 meq/L (ref 3.5–5.1)
Sodium: 142 meq/L (ref 135–145)
Total Bilirubin: 0.3 mg/dL (ref 0.2–1.2)
Total Protein: 6.6 g/dL (ref 6.0–8.3)

## 2024-01-23 NOTE — Telephone Encounter (Signed)
 Pt is scheduled for 02/18/24 for Echo

## 2024-01-24 ENCOUNTER — Ambulatory Visit: Admitting: Family

## 2024-01-29 ENCOUNTER — Ambulatory Visit: Admitting: Family

## 2024-01-30 ENCOUNTER — Ambulatory Visit: Admitting: Family

## 2024-01-31 ENCOUNTER — Other Ambulatory Visit: Payer: Self-pay | Admitting: Family

## 2024-01-31 DIAGNOSIS — R200 Anesthesia of skin: Secondary | ICD-10-CM

## 2024-02-04 ENCOUNTER — Ambulatory Visit: Admitting: Family

## 2024-02-04 ENCOUNTER — Encounter: Payer: Self-pay | Admitting: Family

## 2024-02-04 ENCOUNTER — Telehealth: Payer: Self-pay | Admitting: Family

## 2024-02-04 ENCOUNTER — Other Ambulatory Visit (HOSPITAL_COMMUNITY): Payer: Self-pay

## 2024-02-04 ENCOUNTER — Other Ambulatory Visit: Payer: Self-pay | Admitting: Family

## 2024-02-04 VITALS — BP 120/72 | HR 70 | Temp 98.0°F | Ht 68.0 in | Wt 198.8 lb

## 2024-02-04 DIAGNOSIS — G4733 Obstructive sleep apnea (adult) (pediatric): Secondary | ICD-10-CM

## 2024-02-04 DIAGNOSIS — E66811 Obesity, class 1: Secondary | ICD-10-CM

## 2024-02-04 DIAGNOSIS — E782 Mixed hyperlipidemia: Secondary | ICD-10-CM

## 2024-02-04 DIAGNOSIS — L84 Corns and callosities: Secondary | ICD-10-CM

## 2024-02-04 DIAGNOSIS — B070 Plantar wart: Secondary | ICD-10-CM

## 2024-02-04 DIAGNOSIS — I10 Essential (primary) hypertension: Secondary | ICD-10-CM

## 2024-02-04 DIAGNOSIS — R7303 Prediabetes: Secondary | ICD-10-CM

## 2024-02-04 NOTE — Progress Notes (Unsigned)
 Referring Physician:  Corwin Antu, FNP [address] [place],  [place] [address]  Primary Physician:  Corwin Antu, FNP  History of Present Illness: [name] has a history of HTN, asthma, OSA, GERD, history of cervical CA, obesity, chronic pain, bipolar, hyperlipidemia.   History of chronic back and right leg pain with intermittent flare ups x years.   Last seen by me on 11/06/23. She has known lumbar spondylosis with vacuum disc at L5-S1 with mild bilateral lateral recess stenosis.   She has been in PT at [facility] (initial eval on 09/10/23). She had injection with Dr. Dodson and had EMG done. She continues to see [facility].   She is here for follow up.   She did not have relief with last lumbar ESI. She is in her second round of PT.   She continues with constant right sided LBP with constant bilateral posterior leg pain to her feet. She has intermittent numbness and tingling in both legs. Pain is worse with standing and working in the house. She has weakness in both legs- this is worse. She is in PT with minimal relief.   She is on SUBOXONE. She sees [facility]. She is also taking neurontin  and baclofen .   She quit smoking in 2021.   Bowel/Bladder Dysfunction: none, some bowel urgency since having her gallbladder removed.   Conservative measures:  Physical therapy: [facility] initial eval 09/10/23 Multimodal medical therapy including regular antiinflammatories: baclofen , gabapentin , prednisone  Injections:  11/08/23  bilateral L3, L4 medial branch and dorsal rami blocks with no significant relief  10/18/23 repeat bilateral L5-S1 TF ESI with celestone  09/27/23  L5-S1 TF ESI with no improvement   Past Surgery: no spinal surgeries  The symptoms are causing a significant impact on the patient's life.   Review of Systems:  A 10 point review of systems is negative, except for the pertinent positives and negatives detailed in the  HPI.  Past Medical History: Past Medical History:  Diagnosis Date   Allergy    Anxiety    Cervical cancer (HCC) 2011   stage 3   Chronic headaches    Depression    Edema 10/01/2023   Fatty liver    Frequent falls    GERD (gastroesophageal reflux disease)    Lumbar radiculopathy    Moderate obstructive sleep apnea 07/10/2023    Past Surgical History: Past Surgical History:  Procedure Laterality Date   ABDOMINAL HYSTERECTOMY     Total   ANTERIOR AND POSTERIOR REPAIR N/A 04/12/2020   Procedure: ANTERIOR (CYSTOCELE) AND POSTERIOR REPAIR (RECTOCELE);  Surgeon: Janit Alm Agent, MD;  Location: [facility] ORS;  Service: Gynecology;  Laterality: N/A;   BLADDER SUSPENSION N/A 04/12/2020   Procedure: [facility] PROCEDURE;  Surgeon: Janit Alm Agent, MD;  Location: [facility] OR[facility];  Service: Gynecology;  Laterality: N/A;  TOT   CHOLECYSTECTOMY N/A 11/30/2022   Procedure: LAPAROSCOPIC CHOLECYSTECTOMY;  Surgeon: Dasie Leonor CROME, MD;  Location: [facility] OR;  Service: General;  Laterality: N/A;   COLONOSCOPY WITH PROPOFOL  N/A 01/26/2021   Procedure: COLONOSCOPY WITH PROPOFOL ;  Surgeon: Dessa Reyes ORN, MD;  Location: [facility] END[facility]COPY;  Service: Endoscopy;  Laterality: N/A;   COLPOSCOPY  2011 or 2012   stage 3-removed cancerous spot on the cervix   ESOPHAGOGASTRODUODENOSCOPY (EGD) WITH PROPOFOL  N/A 01/26/2021   Procedure: ESOPHAGOGASTRODUODENOSCOPY (EGD) WITH PROPOFOL ;  Surgeon: Dessa Reyes ORN, MD;  Location: [facility] END[facility]COPY;  Service: Endoscopy;  Laterality: N/A;   EXCISION OF TONGUE LESION N/A  10/15/2019   Procedure: EXCISION OF TONGUE LESION;  Surgeon: Blair Mt, MD;  Location: [facility] OR[facility];  Service: ENT;  Laterality: N/A;   FOOT SURGERY Right    x 2-took knots out from bottom of the foot-not sure of the technical name   LAPAROSCOPIC VAGINAL HYSTERECTOMY WITH SALPINGO OOPHORECTOMY Bilateral 04/12/2020   Procedure: LAPAROSCOPIC ASSISTED VAGINAL HYSTERECTOMY WITH SALPINGO OOPHORECTOMY;  Surgeon: Janit Alm Agent, MD;  Location: [facility] OR[facility];  Service: Gynecology;  Laterality: Bilateral;   PAROTIDECTOMY Right 10/15/2019   Procedure: PAROTIDECTOMY;  Surgeon: Blair Mt, MD;  Location: [facility] OR[facility];  Service: ENT;  Laterality: Right;   TUBAL LIGATION      Allergies: Allergies as of 02/07/2024 - Review Complete 02/07/2024  Allergen Reaction Noted   Amoxicillin Rash 10/02/2019   Penicillins Rash 07/22/2015    Medications: Outpatient Encounter Medications as of 02/07/2024  Medication Sig   amitriptyline  (ELAVIL ) 25 MG tablet TAKE 1 TABLET BY MOUTH EVERYDAY AT BEDTIME   baclofen  (LIORESAL ) 10 MG tablet Take 1 tablet (10 mg total) by mouth 2 (two) times daily as needed for muscle spasms.   busPIRone  (BUSPAR ) 15 MG tablet Take 1 tablet (15 mg total) by mouth 3 (three) times daily.   cetirizine  (ZYRTEC ) 10 MG tablet Take 1 tablet (10 mg total) by mouth daily.   clotrimazole  (CLOTRIMAZOLE  ANTI-FUNGAL) 1 % cream Apply 1 application twice daily for two weeks   FLUoxetine  HCl 60 MG TABS TAKE 60 MG BY MOUTH DAILY.   fluticasone  (FLOVENT  HFA) 110 MCG/ACT inhaler Inhale 1 puff into the lungs in the morning and at bedtime.   furosemide  (LASIX ) 20 MG tablet Take one po every day for five days and then after take one tablet prn > 3 pounds weight gain in one day   gabapentin  (NEURONTIN ) 400 MG capsule Take 400 mg by mouth 3 (three) times daily.   Hydrocortisone  Acetate 1 % CREA Apply 1 application  topically daily as needed (heat rash).   hydrOXYzine  (ATARAX ) 25 MG tablet TAKE 1 TABLET BY MOUTH EVERYDAY AT BEDTIME   mupirocin  ointment (BACTROBAN ) 2 % APPLY TO AFFECTED AREA TOPICALLY EVERY DAY   nystatin  (MYCOSTATIN /NYSTOP ) powder APPLY TO AFFECTED AREA 3 TIMES A DAY   omeprazole  (PRILOSEC) 20 MG capsule TAKE 1 CAPSULE BY MOUTH EVERY DAY   ondansetron  (ZOFRAN ) 4 MG tablet TAKE 1 TABLET BY MOUTH EVERY 8 HOURS AS NEEDED FOR NAUSEA AND VOMITING   potassium chloride  (KLOR-CON  M) 10 MEQ tablet Take 1 tablet (10  mEq total) by mouth 2 (two) times daily for 5 days.   pravastatin (PRAVACHOL) 10 MG tablet Take 10 mg by mouth at bedtime.   promethazine  (PHENERGAN ) 25 MG tablet Take 25 mg by mouth every 6 (six) hours as needed for nausea or vomiting.   semaglutide -weight management (WEGOVY ) 0.25 MG/0.5ML SOAJ SQ injection Start 0.25 mg qweek for four weeks, then increase to 0.5 mg dose qweek   semaglutide -weight management (WEGOVY ) 0.5 MG/0.5ML SOAJ SQ injection Inject 0.5 mg [place] weekly   [START ON 02/26/2024] semaglutide -weight management (WEGOVY ) 1 MG/0.5ML SOAJ SQ injection Inject 1 mg into the skin once a week.   valACYclovir  (VALTREX ) 500 MG tablet TAKE 2 TABLETS BY MOUTH EVERY DAY   VENTOLIN  HFA 108 (90 Base) MCG/ACT inhaler TAKE 2 PUFFS BY MOUTH EVERY 6 HOURS AS NEEDED FOR WHEEZE OR SHORTNESS OF BREATH   No facility-administered encounter medications on file as of 02/07/2024.    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   Drug use: Not Currently    Frequency: 1.0 times per week    Types: Marijuana    Comment: as needed    Family Medical History: Family History  Problem Relation Age of Onset   Hypertension Mother    Anxiety disorder Mother    Arthritis Mother    COPD Mother    Thyroid  disease Mother    Kidney disease Father        was on dialysis   Obesity Father    Uterine cancer Sister    Cervical cancer Sister    ADD / ADHD Brother    Cervical cancer Maternal Grandmother    Cancer Maternal Grandmother    Varicose Veins Maternal Grandmother    Lung cancer Maternal  Grandfather    Brain cancer Maternal Grandfather    Cancer Maternal Grandfather    Deafness Daughter    Thyroid  disease Daughter    ADD / ADHD Daughter    Breast cancer Paternal Aunt     Physical Examination: Vitals:   02/07/24 1011  BP: 124/72     Awake, alert, oriented to person, place, and time.  Speech is clear and fluent. Fund of knowledge is appropriate.   Cranial Nerves: Pupils equal round and reactive to light.  Facial tone is symmetric.    No abnormal lesions on exposed skin.   Strength: Side Iliopsoas Quads Hamstring PF DF EHL  R 5 5 5 5 5 5   L 5 5 5 5 5 5    Reflexes are 2+ and symmetric at the patella and achilles.    Clonus is not present.   Bilateral lower extremity sensation is intact to light touch, but diminished mid calf to foot bilaterally.   Gait is normal.   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   Thank you for the referral of this patient. It was our privilege to participate in care of your patient. Feel free to contact us  with any further questions.  _____________________________ Jannett Fairly, M.D.    Assessment and Plan: Ms. Neubecker has a history of chronic back and right leg pain with intermittent flare ups x years.   She has constant right sided LBP with constant bilateral posterior leg pain to her feet. She has numbness, tingling, and weakness.   She has known lumbar spondylosis with vacuum disc at L5-S1 with mild bilateral lateral recess stenosis.  EMG shows generalized sensorimotor peripheral neuropathy with superimposed bilateral chronic S1 radiculopathies.   No improvement with PT, injections, or medications.   Treatment options discussed with patient and following plan made:   - Referral to Dr. Claudene to  consider any possible surgery options. She has failed conservative management.  - Lumbar xrays with flex/ext to be done on her way out of clinic.  - Continue with PT for now.  -  Continue current medications from other providers including SUBOXONE, baclofen , and neurontin . She sees [facility].   I spent a total of 20 minutes in face-to-face and non-face-to-face activities related to this patient's care today including review of outside records, review of imaging, review of symptoms, physical exam, discussion of differential diagnosis, discussion of treatment options, and documentation.   Glade Boys PA-C Dept. of Neurosurgery

## 2024-02-04 NOTE — Telephone Encounter (Signed)
 Pt has been on wegovy   With new medicaid coverage issues will she need to switch to zepbound? She has one pen left so I am trying to get ahead of it.   Does have documented OSA with sleep study in chart (06/15/2023)  On cpap therapy  Working on diet and exercise.  Let me know if p/a needed thanks!

## 2024-02-04 NOTE — Telephone Encounter (Signed)
 Oh man baseline BMI was not :(

## 2024-02-04 NOTE — Progress Notes (Signed)
 Established Patient Office Visit  Subjective:      CC:  Chief Complaint  Patient presents with   Follow-up    2 week follow up on swelling issues    HPI: Erin Dorsey is a 53 y.o. female presenting on 02/04/2024 for Follow-up (2 week follow up on swelling issues) .  Discussed the use of AI scribe software for clinical note transcription with the patient, who gave verbal consent to proceed.  History of Present Illness Erin Dorsey is a 53 year old female who presents with concerns about plantar warts and leg swelling.  She has persistent plantar warts, particularly at the base of her right pinky toe, which have been previously treated with freezing. The wart is painful, especially when walking. She also mentions a new callus on her big toe.  She experiences significant swelling in her lower legs, which was previously severe but has improved. No worsening shortness of breath is noted.  She is currently on Wegovy , administered once a week, and has been on this medication for three weeks. She reports a weight loss of five pounds since starting the medication and is concerned about potential changes in Medicaid coverage for this medication.  She has a history of moderate obstructive sleep apnea, diagnosed via a home sleep test on June 15, 2023, with an apneic event rate of 15.6 per hour and an oxygen saturation of 73.4%. She uses a CPAP device and is compliant with its use.  She is actively participating in physical therapy twice a week to improve her gait and reports some improvement. She is also working on her diet to aid in weight management.         Social history:  Relevant past medical, surgical, family and social history reviewed and updated as indicated. Interim medical history since our last visit reviewed.  Allergies and medications reviewed and updated.  DATA REVIEWED: CHART IN EPIC     ROS: Negative unless specifically indicated above in  HPI.    Current Outpatient Medications:    amitriptyline  (ELAVIL ) 25 MG tablet, TAKE 1 TABLET BY MOUTH EVERYDAY AT BEDTIME, Disp: 90 tablet, Rfl: 3   baclofen  (LIORESAL ) 10 MG tablet, Take 1 tablet (10 mg total) by mouth 2 (two) times daily as needed for muscle spasms., Disp: 30 tablet, Rfl: 5   busPIRone  (BUSPAR ) 15 MG tablet, Take 1 tablet (15 mg total) by mouth 3 (three) times daily., Disp: 270 tablet, Rfl: 4   cetirizine  (ZYRTEC ) 10 MG tablet, Take 1 tablet (10 mg total) by mouth daily., Disp: 90 tablet, Rfl: 3   clotrimazole  (CLOTRIMAZOLE  ANTI-FUNGAL) 1 % cream, Apply 1 application twice daily for two weeks, Disp: 30 g, Rfl: 0   FLUoxetine  HCl 60 MG TABS, TAKE 60 MG BY MOUTH DAILY., Disp: 90 tablet, Rfl: 3   fluticasone  (FLOVENT  HFA) 110 MCG/ACT inhaler, Inhale 1 puff into the lungs in the morning and at bedtime., Disp: 1 each, Rfl: 12   furosemide  (LASIX ) 20 MG tablet, Take one po every day for five days and then after take one tablet prn > 3 pounds weight gain in one day, Disp: 30 tablet, Rfl: 0   gabapentin  (NEURONTIN ) 400 MG capsule, Take 400 mg by mouth 3 (three) times daily., Disp: , Rfl:    Hydrocortisone  Acetate 1 % CREA, Apply 1 application  topically daily as needed (heat rash)., Disp: 15 g, Rfl: 0   hydrOXYzine  (ATARAX ) 25 MG tablet, TAKE 1 TABLET BY MOUTH EVERYDAY AT  BEDTIME, Disp: 90 tablet, Rfl: 2   mupirocin  ointment (BACTROBAN ) 2 %, APPLY TO AFFECTED AREA TOPICALLY EVERY DAY, Disp: 22 g, Rfl: 0   nystatin  (MYCOSTATIN /NYSTOP ) powder, APPLY TO AFFECTED AREA 3 TIMES A DAY, Disp: 15 g, Rfl: 5   omeprazole  (PRILOSEC) 20 MG capsule, TAKE 1 CAPSULE BY MOUTH EVERY DAY, Disp: 90 capsule, Rfl: 1   ondansetron  (ZOFRAN ) 4 MG tablet, TAKE 1 TABLET BY MOUTH EVERY 8 HOURS AS NEEDED FOR NAUSEA AND VOMITING, Disp: 20 tablet, Rfl: 0   potassium chloride  (KLOR-CON  M) 10 MEQ tablet, Take 1 tablet (10 mEq total) by mouth 2 (two) times daily for 5 days., Disp: 10 tablet, Rfl: 0   pravastatin  (PRAVACHOL) 10 MG tablet, Take 10 mg by mouth at bedtime., Disp: , Rfl:    promethazine  (PHENERGAN ) 25 MG tablet, Take 25 mg by mouth every 6 (six) hours as needed for nausea or vomiting., Disp: , Rfl:    semaglutide -weight management (WEGOVY ) 0.25 MG/0.5ML SOAJ SQ injection, Start 0.25 mg qweek for four weeks, then increase to 0.5 mg dose qweek, Disp: 2 mL, Rfl: 0   valACYclovir  (VALTREX ) 500 MG tablet, TAKE 2 TABLETS BY MOUTH EVERY DAY, Disp: 60 tablet, Rfl: 2   VENTOLIN  HFA 108 (90 Base) MCG/ACT inhaler, TAKE 2 PUFFS BY MOUTH EVERY 6 HOURS AS NEEDED FOR WHEEZE OR SHORTNESS OF BREATH, Disp: 18 each, Rfl: 2   [START ON 02/05/2024] semaglutide -weight management (WEGOVY ) 0.5 MG/0.5ML SOAJ SQ injection, Inject 0.5 mg [place] weekly (Patient not taking: Reported on 02/04/2024), Disp: 2 mL, Rfl: 0   [START ON 02/26/2024] semaglutide -weight management (WEGOVY ) 1 MG/0.5ML SOAJ SQ injection, Inject 1 mg into the skin once a week. (Patient not taking: Reported on 02/04/2024), Disp: 6 mL, Rfl: 0        Objective:        BP 120/72 (BP Location: Left Arm, Patient Position: Sitting, Cuff Size: Large)   Pulse 70   Temp 98 F (36.7 C) (Temporal)   Ht 5' 8 (1.727 m)   Wt 198 lb 12.8 oz (90.2 kg)   LMP 06/23/2015   SpO2 96%   BMI 30.23 kg/m   Physical Exam MEASUREMENTS: Weight- 198. EXTREMITIES: Swelling in lower extremities improved. SKIN: Plantar wart at base of right pinky toe. Callus on toe improved.  Wt Readings from Last 3 Encounters:  02/04/24 198 lb 12.8 oz (90.2 kg)  01/15/24 203 lb 9.6 oz (92.4 kg)  12/17/23 202 lb 14.4 oz (92 kg)    Physical Exam Cardiovascular:     Rate and Rhythm: Normal rate.  Musculoskeletal:     Right lower leg: 1+ Pitting Edema present.     Left lower leg: 1+ Pitting Edema present.  Feet:     Right foot:     Toenail Condition: Fungal disease present.    Left foot:     Toenail Condition: Fungal disease present. Skin:    General: Skin is warm.    Wt  Readings from Last 3 Encounters:  02/04/24 198 lb 12.8 oz (90.2 kg)  01/15/24 203 lb 9.6 oz (92.4 kg)  12/17/23 202 lb 14.4 oz (92 kg)            Results DIAGNOSTIC Home sleep test: Moderate obstructive sleep apnea with apneic events of 15.6 per hour and O2 saturation of 73.4% (06/15/2023)  PROCEDURE Procedure: Cryotherapy of plantar wart Description: Cryotherapy applied to plantar wart at the base of the right fifth toe.  Assessment & Plan:   Assessment  and Plan Assessment & Plan Plantar wart, right foot Persistent plantar wart at the base of the right pinky toe causing pain during ambulation. Previous treatments have been partially effective, but the wart remains. - Freeze the plantar wart again. - Consider referral to a podiatrist for further management if the wart persists. -Pt gave verbal consent prior. Scalpel used to par wart prior to treatment with cryotherapy. Cryotherapy blast applied until white bead formation x 2 rounds. Advised pt to apply Aquaphor otherwise no other treatment needed. Educated will likely start to resolve within 4-6 days, may blister and or slough off however monitor for s/s infection. Pt tolerated procedure well  May need repeat rounds of cryotherapy pt knows to schedule f/u if repeat session needed.  Handout given to pt as well.    Bilateral lower extremity swelling Swelling in the lower extremities has improved since the last visit. No worsening of shortness of breath reported. - Consider referral to a vascular specialist if symptoms worsen.  Obstructive sleep apnea Moderate obstructive sleep apnea diagnosed via home sleep test on June 15, 2023, with compliance to CPAP therapy. Sleep apnea may allow continued coverage of Wegovy  under Medicaid. - Ensure documentation of obstructive sleep apnea is up to date. - Monitor for any issues with Wegovy  coverage and submit prior authorization if needed.  Obesity Obesity management with Wegovy   initiated three weeks ago. Initial weight loss of 5 pounds noted. She is engaged in physical therapy twice a week and dietary modifications. Due to recent Medicaid coverage changes, Wegovy  may not be covered unless associated with sleep apnea. Zepbound is a potential alternative if coverage issues arise. - Continue Wegovy  0.25 mg/0.5 mL SQ injection weekly. - If Wegovy  is not covered, consider switching to Zepbound.       Return in about 3 months (around 05/06/2024) for f/u weight loss medication.     Ginger Patrick, MSN, APRN, FNP-C [facility]

## 2024-02-07 ENCOUNTER — Encounter: Payer: Self-pay | Admitting: Orthopedic Surgery

## 2024-02-07 ENCOUNTER — Ambulatory Visit (INDEPENDENT_AMBULATORY_CARE_PROVIDER_SITE_OTHER)

## 2024-02-07 ENCOUNTER — Ambulatory Visit: Admitting: Orthopedic Surgery

## 2024-02-07 VITALS — BP 124/72 | Ht 68.0 in | Wt 198.0 lb

## 2024-02-07 DIAGNOSIS — M47816 Spondylosis without myelopathy or radiculopathy, lumbar region: Secondary | ICD-10-CM

## 2024-02-07 DIAGNOSIS — M5416 Radiculopathy, lumbar region: Secondary | ICD-10-CM

## 2024-02-07 DIAGNOSIS — G629 Polyneuropathy, unspecified: Secondary | ICD-10-CM | POA: Diagnosis not present

## 2024-02-07 DIAGNOSIS — M51362 Other intervertebral disc degeneration, lumbar region with discogenic back pain and lower extremity pain: Secondary | ICD-10-CM

## 2024-02-11 ENCOUNTER — Ambulatory Visit: Admitting: Family

## 2024-02-12 NOTE — Telephone Encounter (Signed)
 Do you think that contrave could potentially be an option for this pt?

## 2024-02-14 ENCOUNTER — Other Ambulatory Visit: Payer: Self-pay | Admitting: Family

## 2024-02-14 DIAGNOSIS — R600 Localized edema: Secondary | ICD-10-CM

## 2024-02-14 DIAGNOSIS — R0609 Other forms of dyspnea: Secondary | ICD-10-CM

## 2024-02-15 NOTE — Progress Notes (Unsigned)
 Referring Physician:  Corwin Antu, FNP [id number] [place],  [place] [address]  Primary Physician:  Corwin Antu, FNP  History of Present Illness: 02/07/2024 Today patient is coming in to discuss possible surgery?  02/07/2024 Note from [place], [place] [name] has a history of HTN, asthma, OSA, GERD, history of cervical CA, obesity, chronic pain, bipolar, hyperlipidemia.   History of chronic back and right leg pain with intermittent flare ups x years.   Last seen by me on 11/06/23. She has known lumbar spondylosis with vacuum disc at L5-S1 with mild bilateral lateral recess stenosis.   She has been in PT at [facility] (initial eval on 09/10/23). She had injection with Dr. Dodson and had EMG done. She continues to see [facility].   She is here for follow up.   She did not have relief with last lumbar ESI. She is in her second round of PT.   She continues with constant right sided LBP with constant bilateral posterior leg pain to her feet. She has intermittent numbness and tingling in both legs. Pain is worse with standing and working in the house. She has weakness in both legs- this is worse. She is in PT with minimal relief.   She is on SUBOXONE. She sees [facility]. She is also taking neurontin  and baclofen .   She quit smoking in 2021.   Bowel/Bladder Dysfunction: none, some bowel urgency since having her gallbladder removed.   Conservative measures:  Physical therapy: [facility] initial eval 09/10/23 Multimodal medical therapy including regular antiinflammatories: baclofen , gabapentin , prednisone  Injections:  11/08/23  bilateral L3, L4 medial branch and dorsal rami blocks with no significant relief  10/18/23 repeat bilateral L5-S1 TF ESI with celestone  09/27/23  L5-S1 TF ESI with no improvement   Past Surgery: no spinal surgeries  The symptoms are causing a significant impact on the patient's life.   Review of Systems:   A 10 point review of systems is negative, except for the pertinent positives and negatives detailed in the HPI.  Past Medical History: Past Medical History:  Diagnosis Date   Allergy    Anxiety    Cervical cancer (HCC) 2011   stage 3   Chronic headaches    Depression    Edema 10/01/2023   Fatty liver    Frequent falls    GERD (gastroesophageal reflux disease)    Lumbar radiculopathy    Moderate obstructive sleep apnea 07/10/2023    Past Surgical History: Past Surgical History:  Procedure Laterality Date   ABDOMINAL HYSTERECTOMY     Total   ANTERIOR AND POSTERIOR REPAIR N/A 04/12/2020   Procedure: ANTERIOR (CYSTOCELE) AND POSTERIOR REPAIR (RECTOCELE);  Surgeon: Janit Alm Agent, MD;  Location: [facility] ORS;  Service: Gynecology;  Laterality: N/A;   BLADDER SUSPENSION N/A 04/12/2020   Procedure: [facility] PROCEDURE;  Surgeon: Janit Alm Agent, MD;  Location: [facility] OR[facility];  Service: Gynecology;  Laterality: N/A;  TOT   CHOLECYSTECTOMY N/A 11/30/2022   Procedure: LAPAROSCOPIC CHOLECYSTECTOMY;  Surgeon: Dasie Leonor CROME, MD;  Location[facility];  Service: General;  Laterality: N/A;   COLONOSCOPY WITH PROPOFOL  N/A 01/26/2021   Procedure: COLONOSCOPY WITH PROPOFOL ;  Surgeon: Dessa Reyes ORN, MD;  Location: [facility] ENDOSCOPY;  Service: Endoscopy;  Laterality: N/A;   COLPOSCOPY  2011 or 2012   stage 3-removed cancerous spot on the cervix   ESOPHAGOGASTRODUODENOSCOPY (EGD) WITH PROPOFOL  N/A 01/26/2021   Procedure: ESOPHAGOGASTRODUODENOSCOPY (EGD) WITH PROPOFOL ;  Surgeon: Dessa Reyes ORN, MD;  Location: [facility] END[facility]COPY;  Service: Endoscopy;  Laterality: N/A;   EXCISION OF TONGUE LESION N/A 10/15/2019   Procedure: EXCISION OF TONGUE LESION;  Surgeon: Blair Mt, MD;  Location: [facility];  Service: ENT;  Laterality: N/A;   FOOT SURGERY Right    x 2-took knots out from bottom of the foot-not sure of the technical name   LAPAROSCOPIC VAGINAL HYSTERECTOMY WITH SALPINGO OOPHORECTOMY Bilateral  04/12/2020   Procedure: LAPAROSCOPIC ASSISTED VAGINAL HYSTERECTOMY WITH SALPINGO OOPHORECTOMY;  Surgeon: Janit Alm Agent, MD;  Location: [facility] OR[facility];  Service: Gynecology;  Laterality: Bilateral;   PAROTIDECTOMY Right 10/15/2019   Procedure: PAROTIDECTOMY;  Surgeon: Blair Mt, MD;  Location: [facility] OR[facility];  Service: ENT;  Laterality: Right;   TUBAL LIGATION      Allergies: Allergies as of 02/22/2024 - Review Complete 02/07/2024  Allergen Reaction Noted   Amoxicillin Rash 10/02/2019   Penicillins Rash 07/22/2015    Medications: Outpatient Encounter Medications as of 02/22/2024  Medication Sig   furosemide  (LASIX ) 20 MG tablet TAKE 1 TABLET BY MOUTH EVERY DAY   amitriptyline  (ELAVIL ) 25 MG tablet TAKE 1 TABLET BY MOUTH EVERYDAY AT BEDTIME   baclofen  (LIORESAL ) 10 MG tablet Take 1 tablet (10 mg total) by mouth 2 (two) times daily as needed for muscle spasms.   busPIRone  (BUSPAR ) 15 MG tablet Take 1 tablet (15 mg total) by mouth 3 (three) times daily.   cetirizine  (ZYRTEC ) 10 MG tablet Take 1 tablet (10 mg total) by mouth daily.   clotrimazole  (CLOTRIMAZOLE  ANTI-FUNGAL) 1 % cream Apply 1 application twice daily for two weeks   FLUoxetine  HCl 60 MG TABS TAKE 60 MG BY MOUTH DAILY.   fluticasone  (FLOVENT  HFA) 110 MCG/ACT inhaler Inhale 1 puff into the lungs in the morning and at bedtime.   gabapentin  (NEURONTIN ) 400 MG capsule Take 400 mg by mouth 3 (three) times daily.   Hydrocortisone  Acetate 1 % CREA Apply 1 application  topically daily as needed (heat rash).   hydrOXYzine  (ATARAX ) 25 MG tablet TAKE 1 TABLET BY MOUTH EVERYDAY AT BEDTIME   mupirocin  ointment (BACTROBAN ) 2 % APPLY TO AFFECTED AREA TOPICALLY EVERY DAY   nystatin  (MYCOSTATIN /NYSTOP ) powder APPLY TO AFFECTED AREA 3 TIMES A DAY   omeprazole  (PRILOSEC) 20 MG capsule TAKE 1 CAPSULE BY MOUTH EVERY DAY   ondansetron  (ZOFRAN ) 4 MG tablet TAKE 1 TABLET BY MOUTH EVERY 8 HOURS AS NEEDED FOR NAUSEA AND VOMITING   potassium chloride   (KLOR-CON  M) 10 MEQ tablet Take 1 tablet (10 mEq total) by mouth 2 (two) times daily for 5 days.   pravastatin (PRAVACHOL) 10 MG tablet Take 10 mg by mouth at bedtime.   promethazine  (PHENERGAN ) 25 MG tablet Take 25 mg by mouth every 6 (six) hours as needed for nausea or vomiting.   semaglutide -weight management (WEGOVY ) 0.25 MG/0.5ML SOAJ SQ injection Start 0.25 mg qweek for four weeks, then increase to 0.5 mg dose qweek   semaglutide -weight management (WEGOVY ) 0.5 MG/0.5ML SOAJ SQ injection Inject 0.5 mg [place] weekly   [START ON 02/26/2024] semaglutide -weight management (WEGOVY ) 1 MG/0.5ML SOAJ SQ injection Inject 1 mg into the skin once a week.   valACYclovir  (VALTREX ) 500 MG tablet TAKE 2 TABLETS BY MOUTH EVERY DAY   VENTOLIN  HFA 108 (90 Base) MCG/ACT inhaler TAKE 2 PUFFS BY MOUTH EVERY 6 HOURS AS NEEDED FOR WHEEZE OR SHORTNESS OF BREATH   No facility-administered encounter medications on file as of 02/22/2024.    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   Drug use: Not Currently    Frequency: 1.0 times per week    Types: Marijuana    Comment: as needed    Family Medical History: Family History  Problem Relation Age of Onset   Hypertension Mother    Anxiety disorder Mother    Arthritis Mother    COPD Mother    Thyroid  disease Mother    Kidney disease Father        was on dialysis   Obesity Father    Uterine cancer Sister    Cervical cancer Sister    ADD / ADHD Brother    Cervical cancer Maternal Grandmother    Cancer Maternal Grandmother    Varicose Veins  Maternal Grandmother    Lung cancer Maternal Grandfather    Brain cancer Maternal Grandfather    Cancer Maternal Grandfather    Deafness Daughter    Thyroid  disease Daughter    ADD / ADHD Daughter    Breast cancer Paternal Aunt     Physical Examination: There were no vitals filed for this visit.    Awake, alert, oriented to person, place, and time.  Speech is clear and fluent. Fund of knowledge is appropriate.   Cranial Nerves: Pupils equal round and reactive to light.  Facial tone is symmetric.    No abnormal lesions on exposed skin.   Strength: Side Iliopsoas Quads Hamstring PF DF EHL  R 5 5 5 5 5 5   L 5 5 5 5 5 5    Reflexes are 2+ and symmetric at the patella and achilles.    Clonus is not present.   Bilateral lower extremity sensation is intact to light touch, but diminished mid calf to foot bilaterally.   Gait is normal.   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   Thank you for the referral of this patient. It was our privilege to participate in care of your patient. Feel free to contact us  with any further questions.  _____________________________ Jannett Fairly, M.D.    Assessment and Plan: Erin Dorsey has a history of chronic back and right leg pain with intermittent flare ups x years.   She has constant right sided LBP with constant bilateral posterior leg pain to her feet. She has numbness, tingling, and weakness.   She has known lumbar spondylosis with vacuum disc at L5-S1 with mild bilateral lateral recess stenosis.  EMG shows generalized sensorimotor peripheral neuropathy with superimposed bilateral chronic S1 radiculopathies.   No improvement with PT, injections, or medications.   Treatment options discussed with patient and following  plan made:   - Referral to Dr. Claudene to consider any possible surgery options. She has failed conservative management.  - Lumbar xrays with flex/ext to be done on her way out of clinic.  - Continue with PT for now.  -  Continue current medications from other providers including SUBOXONE, baclofen , and neurontin . She sees [facility].   I spent a total of 20 minutes in face-to-face and non-face-to-face activities related to this patient's care today including review of outside records, review of imaging, review of symptoms, physical exam, discussion of differential diagnosis, discussion of treatment options, and documentation.   Glade Boys PA-C Dept. of Neurosurgery

## 2024-02-18 ENCOUNTER — Ambulatory Visit
Admission: RE | Admit: 2024-02-18 | Discharge: 2024-02-18 | Disposition: A | Discharge status: Home / Self Care | Source: Ambulatory Visit | Attending: Family | Admitting: Family

## 2024-02-18 DIAGNOSIS — R0609 Other forms of dyspnea: Secondary | ICD-10-CM | POA: Insufficient documentation

## 2024-02-18 DIAGNOSIS — F419 Anxiety disorder, unspecified: Secondary | ICD-10-CM | POA: Insufficient documentation

## 2024-02-18 DIAGNOSIS — R600 Localized edema: Secondary | ICD-10-CM | POA: Diagnosis not present

## 2024-02-18 DIAGNOSIS — G4730 Sleep apnea, unspecified: Secondary | ICD-10-CM | POA: Diagnosis not present

## 2024-02-18 LAB — ECHOCARDIOGRAM COMPLETE
AR max vel: 2.6 cm2
AV Area VTI: 2.56 cm2
AV Area mean vel: 2.39 cm2
AV Mean grad: 4 mmHg
AV Peak grad: 6.3 mmHg
Ao pk vel: 1.25 m/s
Area-P 1/2: 3.83 cm2
MV VTI: 2.54 cm2
S' Lateral: 3.5 cm

## 2024-02-18 NOTE — Progress Notes (Signed)
*  PRELIMINARY RESULTS* Echocardiogram 2D Echocardiogram has been performed.  Floydene Harder 02/18/2024, 10:40 AM

## 2024-02-19 ENCOUNTER — Encounter: Payer: Self-pay | Admitting: Family

## 2024-02-19 LAB — OPHTHALMOLOGY REPORT-SCANNED

## 2024-02-19 NOTE — Telephone Encounter (Signed)
 Can we call to ask Erin Dorsey does she take the prozac  (fluoxetine ) daily? Does she take the tramadol  (pain medication daily)  Since the shot is no longer covered for weight loss I am trying to see if there are other options.

## 2024-02-19 NOTE — Telephone Encounter (Signed)
 LM for pt to return call.

## 2024-02-20 ENCOUNTER — Ambulatory Visit: Admitting: Family Medicine

## 2024-02-21 ENCOUNTER — Encounter: Payer: Self-pay | Admitting: Family

## 2024-02-21 ENCOUNTER — Other Ambulatory Visit: Payer: Self-pay | Admitting: Family

## 2024-02-21 ENCOUNTER — Ambulatory Visit: Admitting: Family

## 2024-02-21 VITALS — BP 126/78 | HR 68 | Temp 98.3°F | Ht 68.0 in | Wt 199.0 lb

## 2024-02-21 DIAGNOSIS — E876 Hypokalemia: Secondary | ICD-10-CM

## 2024-02-21 DIAGNOSIS — T502X5A Adverse effect of carbonic-anhydrase inhibitors, benzothiadiazides and other diuretics, initial encounter: Secondary | ICD-10-CM

## 2024-02-21 DIAGNOSIS — D72819 Decreased white blood cell count, unspecified: Secondary | ICD-10-CM | POA: Diagnosis not present

## 2024-02-21 DIAGNOSIS — B070 Plantar wart: Secondary | ICD-10-CM | POA: Diagnosis not present

## 2024-02-21 DIAGNOSIS — R600 Localized edema: Secondary | ICD-10-CM | POA: Diagnosis not present

## 2024-02-21 DIAGNOSIS — I8393 Asymptomatic varicose veins of bilateral lower extremities: Secondary | ICD-10-CM | POA: Diagnosis not present

## 2024-02-21 DIAGNOSIS — K2100 Gastro-esophageal reflux disease with esophagitis, without bleeding: Secondary | ICD-10-CM

## 2024-02-21 LAB — CBC WITH DIFFERENTIAL/PLATELET
Basophils Absolute: 0 K/uL (ref 0.0–0.1)
Basophils Relative: 0.9 % (ref 0.0–3.0)
Eosinophils Absolute: 0.1 K/uL (ref 0.0–0.7)
Eosinophils Relative: 1.8 % (ref 0.0–5.0)
HCT: 42.2 % (ref 36.0–46.0)
Hemoglobin: 13.9 g/dL (ref 12.0–15.0)
Lymphocytes Relative: 33.6 % (ref 12.0–46.0)
Lymphs Abs: 1.3 K/uL (ref 0.7–4.0)
MCHC: 32.9 g/dL (ref 30.0–36.0)
MCV: 101.8 fl — ABNORMAL HIGH (ref 78.0–100.0)
Monocytes Absolute: 0.3 K/uL (ref 0.1–1.0)
Monocytes Relative: 7.2 % (ref 3.0–12.0)
Neutro Abs: 2.3 K/uL (ref 1.4–7.7)
Neutrophils Relative %: 56.5 % (ref 43.0–77.0)
Platelets: 180 K/uL (ref 150.0–400.0)
RBC: 4.15 Mil/uL (ref 3.87–5.11)
RDW: 14.9 % (ref 11.5–15.5)
WBC: 4 K/uL (ref 4.0–10.5)

## 2024-02-21 LAB — POTASSIUM: Potassium: 3.8 meq/L (ref 3.5–5.1)

## 2024-02-21 NOTE — Telephone Encounter (Signed)
 Saw patient already, thank you for speaking with the patient.  Please see note for further information.

## 2024-02-21 NOTE — Progress Notes (Signed)
 Established Patient Office Visit  Subjective:      CC:  Chief Complaint  Patient presents with   Foot Swelling    B/l    Warts    Right foot would like removed     HPI: Erin Dorsey is a 53 y.o. female presenting on 02/21/2024 for Foot Swelling (B/l ) and Warts (Right foot would like removed ) .  Discussed the use of AI scribe software for clinical note transcription with the patient, who gave verbal consent to proceed.  History of Present Illness Erin Dorsey is a 53 year old female who presents with persistent leg swelling and pain.  She has experienced leg swelling for a week, with the most severe swelling occurring around September 16th. The swelling is painful and sometimes accompanied by redness extending up her legs. She reports that her ankles have been purple and red, and she experiences numbness and tingling in her feet.  She has been using a diuretic at a dose of 20 mg as needed, but it has not been effective in alleviating her symptoms. She also uses bamboo salts for neuropathy, which she finds comfortable. She has a history of undergoing an echocardiogram and an arterial ultrasound, both of which were within normal limits.  She has been on amitriptyline  for a while. She is active and moves around to prevent nerve pain, although sitting for extended periods can cause some swelling.  No weight changes.   Wt Readings from Last 3 Encounters:  02/22/24 199 lb (90.3 kg)  02/21/24 199 lb (90.3 kg)  02/07/24 198 lb (89.8 kg)          Social history:  Relevant past medical, surgical, family and social history reviewed and updated as indicated. Interim medical history since our last visit reviewed.  Allergies and medications reviewed and updated.  DATA REVIEWED: CHART IN EPIC     ROS: Negative unless specifically indicated above in HPI.    Current Outpatient Medications:    amitriptyline  (ELAVIL ) 25 MG tablet, TAKE 1 TABLET BY MOUTH  EVERYDAY AT BEDTIME, Disp: 90 tablet, Rfl: 3   baclofen  (LIORESAL ) 10 MG tablet, Take 1 tablet (10 mg total) by mouth 2 (two) times daily as needed for muscle spasms., Disp: 30 tablet, Rfl: 5   busPIRone  (BUSPAR ) 15 MG tablet, Take 1 tablet (15 mg total) by mouth 3 (three) times daily., Disp: 270 tablet, Rfl: 4   cetirizine  (ZYRTEC ) 10 MG tablet, Take 1 tablet (10 mg total) by mouth daily., Disp: 90 tablet, Rfl: 3   clotrimazole  (CLOTRIMAZOLE  ANTI-FUNGAL) 1 % cream, Apply 1 application twice daily for two weeks, Disp: 30 g, Rfl: 0   FLUoxetine  HCl 60 MG TABS, TAKE 60 MG BY MOUTH DAILY., Disp: 90 tablet, Rfl: 3   fluticasone  (FLOVENT  HFA) 110 MCG/ACT inhaler, Inhale 1 puff into the lungs in the morning and at bedtime., Disp: 1 each, Rfl: 12   furosemide  (LASIX ) 20 MG tablet, TAKE 1 TABLET BY MOUTH EVERY DAY, Disp: 90 tablet, Rfl: 1   gabapentin  (NEURONTIN ) 400 MG capsule, Take 400 mg by mouth 3 (three) times daily., Disp: , Rfl:    Hydrocortisone  Acetate 1 % CREA, Apply 1 application  topically daily as needed (heat rash)., Disp: 15 g, Rfl: 0   hydrOXYzine  (ATARAX ) 25 MG tablet, TAKE 1 TABLET BY MOUTH EVERYDAY AT BEDTIME, Disp: 90 tablet, Rfl: 2   mupirocin  ointment (BACTROBAN ) 2 %, APPLY TO AFFECTED AREA TOPICALLY EVERY DAY, Disp: 22 g, Rfl:  0   nystatin  (MYCOSTATIN /NYSTOP ) powder, APPLY TO AFFECTED AREA 3 TIMES A DAY, Disp: 15 g, Rfl: 5   ondansetron  (ZOFRAN ) 4 MG tablet, TAKE 1 TABLET BY MOUTH EVERY 8 HOURS AS NEEDED FOR NAUSEA AND VOMITING, Disp: 20 tablet, Rfl: 0   potassium chloride  (KLOR-CON  M) 10 MEQ tablet, Take 1 tablet (10 mEq total) by mouth 2 (two) times daily for 5 days., Disp: 10 tablet, Rfl: 0   pravastatin (PRAVACHOL) 10 MG tablet, Take 10 mg by mouth at bedtime., Disp: , Rfl:    promethazine  (PHENERGAN ) 25 MG tablet, Take 25 mg by mouth every 6 (six) hours as needed for nausea or vomiting., Disp: , Rfl:    valACYclovir  (VALTREX ) 500 MG tablet, TAKE 2 TABLETS BY MOUTH EVERY DAY,  Disp: 60 tablet, Rfl: 2   VENTOLIN  HFA 108 (90 Base) MCG/ACT inhaler, TAKE 2 PUFFS BY MOUTH EVERY 6 HOURS AS NEEDED FOR WHEEZE OR SHORTNESS OF BREATH, Disp: 18 each, Rfl: 2   omeprazole  (PRILOSEC) 20 MG capsule, TAKE 1 CAPSULE BY MOUTH EVERY DAY, Disp: 90 capsule, Rfl: 1        Objective:        BP 126/78   Pulse 68   Temp 98.3 F (36.8 C) (Temporal)   Ht 5' 8 (1.727 m)   Wt 199 lb (90.3 kg)   LMP 06/23/2015   SpO2 95%   BMI 30.26 kg/m   Physical Exam EXTREMITIES: Pulses difficult to assess due to swelling.  Wt Readings from Last 3 Encounters:  02/22/24 199 lb (90.3 kg)  02/21/24 199 lb (90.3 kg)  02/07/24 198 lb (89.8 kg)    Physical Exam Vitals reviewed.  Constitutional:      General: She is not in acute distress.    Appearance: Normal appearance. She is normal weight. She is not ill-appearing, toxic-appearing or diaphoretic.  HENT:     Head: Normocephalic.  Cardiovascular:     Rate and Rhythm: Normal rate and regular rhythm.     Pulses:          Dorsalis pedis pulses are 2+ on the right side and 2+ on the left side.       Posterior tibial pulses are 1+ on the right side and 1+ on the left side.  Pulmonary:     Effort: Pulmonary effort is normal.  Musculoskeletal:        General: Normal range of motion.     Right lower leg: 3+ Edema present.     Left lower leg: 3+ Edema present.  Neurological:     General: No focal deficit present.     Mental Status: She is alert and oriented to person, place, and time. Mental status is at baseline.  Psychiatric:        Mood and Affect: Mood normal.        Behavior: Behavior normal.        Thought Content: Thought content normal.        Judgment: Judgment normal.          Results RADIOLOGY Arterial ultrasound: Within normal limits  DIAGNOSTIC Echocardiogram: Normal findings  Assessment & Plan:   Assessment and Plan Assessment & Plan Bilateral lower extremity swelling Persistent for a week with associated  pain and occasional redness. Previous echocardiogram and arterial ultrasound were normal, excluding significant cardiac or peripheral arterial causes. Current diuretic therapy with furosemide  20 mg daily is inadequate. - Refer to a vascular specialist for further evaluation. 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 Scalpel used to par wart prior to treatment with cryotherapy. Cryotherapy blast applied until white bead formation x 2 rounds. Advised pt to apply Aquaphor otherwise no other treatment needed. Educated will likely start to resolve within 4-6 days, may blister and or slough off however monitor for s/s infection. Pt tolerated procedure well  May need repeat rounds of cryotherapy pt knows to schedule f/u if repeat session needed.  Handout given to pt as well.   Obesity Ongoing management with frustration over lack of approval for semaglutide  (Wegovy ), which has been beneficial for others.       Return if symptoms worsen or fail to improve.     Ginger Patrick, MSN, APRN, FNP-C [facility]

## 2024-02-21 NOTE — Patient Instructions (Signed)
 Referring To Provider Information [facility] [address] [place] [address] [phone]

## 2024-02-22 ENCOUNTER — Ambulatory Visit (INDEPENDENT_AMBULATORY_CARE_PROVIDER_SITE_OTHER): Admitting: Neurosurgery

## 2024-02-22 ENCOUNTER — Encounter: Payer: Self-pay | Admitting: Neurosurgery

## 2024-02-22 ENCOUNTER — Ambulatory Visit: Payer: Self-pay | Admitting: Family

## 2024-02-22 VITALS — BP 130/84 | Ht 68.0 in | Wt 199.0 lb

## 2024-02-22 DIAGNOSIS — M51362 Other intervertebral disc degeneration, lumbar region with discogenic back pain and lower extremity pain: Secondary | ICD-10-CM

## 2024-02-22 DIAGNOSIS — M47816 Spondylosis without myelopathy or radiculopathy, lumbar region: Secondary | ICD-10-CM | POA: Insufficient documentation

## 2024-02-22 DIAGNOSIS — M4727 Other spondylosis with radiculopathy, lumbosacral region: Secondary | ICD-10-CM | POA: Diagnosis not present

## 2024-02-22 DIAGNOSIS — M5416 Radiculopathy, lumbar region: Secondary | ICD-10-CM | POA: Insufficient documentation

## 2024-02-26 IMAGING — NM NM HEPATO W/GB/PHARM/[PERSON_NAME]
2 series · 12 of 12 positions shown · non-contrast
Comparison: Ultrasound November 08, 2020

CLINICAL DATA: Chronic right upper quadrant abdominal pain.

EXAM:
NUCLEAR MEDICINE HEPATOBILIARY IMAGING WITH GALLBLADDER EF
TECHNIQUE: Sequential images of the abdomen were obtained [DATE] minutes
following intravenous administration of radiopharmaceutical. After
oral ingestion of Ensure, gallbladder ejection fraction was
determined. At 60 min, normal ejection fraction is greater than 33%.
RADIOPHARMACEUTICALS:  5.29 mCi 9c-TTm  Choletec IV

[Series 1000: gallbladder ef · 4.80mm/px · 6 of 120 frames shown]
[frame 11/120]
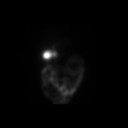
[frame 31/120]
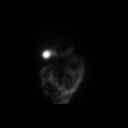
[frame 51/120]
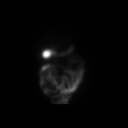
[frame 71/120]
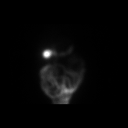
[frame 91/120]
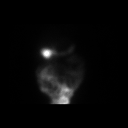
[frame 111/120]
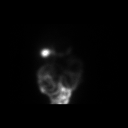

[Series 1000: hepatobiliary scan · 9.59mm/px · 6 of 60 frames shown]
[frame 6/60]
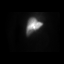
[frame 16/60]
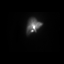
[frame 26/60]
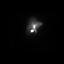
[frame 36/60]
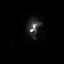
[frame 46/60]
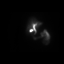
[frame 56/60]
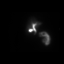

[12 of 12 positions shown; findings below may reference images not displayed]

FINDINGS: Prompt uptake and biliary excretion of activity by the liver is
seen. Gallbladder activity is visualized, consistent with patency of
cystic duct. Biliary activity passes into small bowel, consistent
with patent common bile duct.

Calculated gallbladder ejection fraction is 54%. (Normal gallbladder
ejection fraction with Ensure is greater than 33%.)
IMPRESSION: 1.  Patent cystic and common bile ducts.

2.  Normal gallbladder ejection fraction.

## 2024-02-27 ENCOUNTER — Ambulatory Visit
Admission: RE | Admit: 2024-02-27 | Discharge: 2024-02-27 | Disposition: A | Source: Ambulatory Visit | Attending: Family

## 2024-02-27 ENCOUNTER — Ambulatory Visit
Admission: RE | Admit: 2024-02-27 | Discharge: 2024-02-27 | Disposition: A | Discharge status: Home / Self Care | Source: Ambulatory Visit | Attending: Family | Admitting: Family

## 2024-02-27 DIAGNOSIS — N6320 Unspecified lump in the left breast, unspecified quadrant: Secondary | ICD-10-CM

## 2024-02-28 ENCOUNTER — Ambulatory Visit: Payer: Self-pay | Admitting: Family

## 2024-02-28 NOTE — Progress Notes (Signed)
 noted

## 2024-03-03 ENCOUNTER — Encounter (INDEPENDENT_AMBULATORY_CARE_PROVIDER_SITE_OTHER): Admitting: Vascular Surgery

## 2024-03-03 NOTE — Progress Notes (Unsigned)
 Office Visit Note  Patient: Erin Dorsey             Date of Birth: [birth year]           MRN: [id number]             PCP: Corwin Antu, FNP Referring: Corwin Antu, FNP Visit Date: 03/04/2024   Subjective:  No chief complaint on file.   History of Present Illness: Erin Dorsey is a 53 y.o. female here for follow up ***   Previous HPI 12/17/23 Erin Dorsey is a 53 year old female who presents for evaluation of positive SSA/Rho antibodies checked in association with multiple symptoms including joint pains and swelling, dryness, but also tremor and jerking movement difficulties. She is accompanied by her husband, [name].   She has been experiencing tremors and jerking movements since January, which occur suddenly and sometimes lead to falls. These episodes can happen unexpectedly and involve full-body jerking. She has had falls, including a recent one while climbing stairs, although she sometimes can catch herself when falling. She associates these episodes with her 'sonic nerve' issues.   For the past two months, she has noticed swelling in her feet, accompanied by tightness, tingling, and redness. No similar swelling in her hands. She is currently taking Lasix  and wears diabetic socks for this with some improvement.   She experiences chronic dryness in her eyes and mouth. She uses AT/lubricating eye drops for her dry eyes and manages her dry mouth by drinking fluids although most often prefers Sprite.   She experiences sensitivity to sunlight, which causes her to feel hot and have difficulty breathing. She also reports joint pain in her hips, back, and feet, with her feet swelling and turning red. She develops a recurring rash under her breast treated with topical nystatin .   She had previous removal of a benign mass on right side of her neck.  Does not report any other problems with major salivary gland or cervical lymph node swelling that she has noticed.  Does  not get any discoloration in fingertips with cold exposure.  No history of blood clots.   No Rheumatology ROS completed.   PMFS History:  Patient Active Problem List   Diagnosis Date Noted   Lumbar spondylosis 02/22/2024   Degeneration of intervertebral disc of lumbar region with discogenic back pain and lower extremity pain 02/22/2024   Lumbar radiculopathy 02/22/2024   Asymptomatic varicose veins of both lower extremities 02/21/2024   Corns and callosities 02/04/2024   Plantar wart, right foot 02/04/2024   Plantar wart of right foot 01/15/2024   Prediabetes 01/15/2024   B12 deficiency 11/12/2023   Intertrigo 11/12/2023   Pedal edema 11/12/2023   Elevated brain natriuretic peptide (BNP) level 11/12/2023   DOE (dyspnea on exertion) 11/12/2023   Positive ANA (antinuclear antibody) 07/16/2023   Abnormal EEG 07/16/2023   Moderate obstructive sleep apnea 07/10/2023   Stuttering 05/17/2023   Chronic right-sided low back pain with right-sided sciatica 05/07/2023   Ataxia 05/07/2023   Expressive aphasia 05/07/2023   Spasticity 05/07/2023   Weakness of right lower extremity 05/07/2023   Frequent falls 01/15/2023   Other fatigue 01/15/2023   Excessive daytime sleepiness 01/15/2023   History of marijuana use 11/28/2022   Vitamin B12 deficiency 11/28/2022   Hypokalemia 11/28/2022   Mild intermittent asthma without complication 11/28/2022   Chronic nausea 08/29/2022   Hyperglycemia 08/29/2022   Chronic bilateral low back pain with bilateral sciatica 08/29/2022   Cheilitis  08/29/2022   Gastroesophageal reflux disease with esophagitis without hemorrhage 08/29/2022   Bursitis of hip 08/29/2022   History of bipolar disorder 08/29/2022   Mixed hyperlipidemia 08/29/2022   Polypharmacy 08/29/2022   Recurrent moderate major depressive disorder with anxiety (HCC) 04/27/2021   Calculus of gallbladder without cholecystitis without obstruction 04/27/2021   Other chronic pain 04/27/2021    Seasonal allergic rhinitis due to pollen 08/17/2020   Generalized anxiety disorder 02/03/2020   Genital herpes simplex 01/12/2020   Hx of cervical cancer 08/06/2019   Obesity (BMI 30.0-34.9) 08/06/2019   Essential hypertension 08/06/2019    Past Medical History:  Diagnosis Date   Allergy    Anxiety    Cervical cancer (HCC) 2011   stage 3   Chronic headaches    Depression    Edema 10/01/2023   Fatty liver    Frequent falls    GERD (gastroesophageal reflux disease)    Lumbar radiculopathy    Moderate obstructive sleep apnea 07/10/2023    Family History  Problem Relation Age of Onset   Hypertension Mother    Anxiety disorder Mother    Arthritis Mother    COPD Mother    Thyroid  disease Mother    Kidney disease Father        was on dialysis   Obesity Father    Uterine cancer Sister    Cervical cancer Sister    ADD / ADHD Brother    Cervical cancer Maternal Grandmother    Cancer Maternal Grandmother    Varicose Veins Maternal Grandmother    Lung cancer Maternal Grandfather    Brain cancer Maternal Grandfather    Cancer Maternal Grandfather    Deafness Daughter    Thyroid  disease Daughter    ADD / ADHD Daughter    Breast cancer Paternal Aunt    Past Surgical History:  Procedure Laterality Date   ABDOMINAL HYSTERECTOMY     Total   ANTERIOR AND POSTERIOR REPAIR N/A 04/12/2020   Procedure: ANTERIOR (CYSTOCELE) AND POSTERIOR REPAIR (RECTOCELE);  Surgeon: Janit Alm Agent, MD;  Location: [facility] ORS;  Service: Gynecology;  Laterality: N/A;   BLADDER SUSPENSION N/A 04/12/2020   Procedure: [facility] PROCEDURE;  Surgeon: Janit Alm Agent, MD;  Location: [facility] ORS;  Service: Gynecology;  Laterality: N/A;  TOT   CHOLECYSTECTOMY N/A 11/30/2022   Procedure: LAPAROSCOPIC CHOLECYSTECTOMY;  Surgeon: Dasie Leonor CROME, MD;  Location[facility] OR;  Service: General;  Laterality: N/A;   COLONOSCOPY WITH PROPOFOL  N/A 01/26/2021   Procedure: COLONOSCOPY WITH PROPOFOL ;  Surgeon: Dessa Reyes ORN,  MD;  Location: [facility] ENDOSCOPY;  Service: Endoscopy;  Laterality: N/A;   COLPOSCOPY  2011 or 2012   stage 3-removed cancerous spot on the cervix   ESOPHAGOGASTRODUODENOSCOPY (EGD) WITH PROPOFOL  N/A 01/26/2021   Procedure: ESOPHAGOGASTRODUODENOSCOPY (EGD) WITH PROPOFOL ;  Surgeon: Dessa Reyes ORN, MD;  Location: [facility] ENDOSCOPY;  Service: Endoscopy;  Laterality: N/A;   EXCISION OF TONGUE LESION N/A 10/15/2019   Procedure: EXCISION OF TONGUE LESION;  Surgeon: Blair Mt, MD;  Location: [facility] ORS;  Service: ENT;  Laterality: N/A;   FOOT SURGERY Right    x 2-took knots out from bottom of the foot-not sure of the technical name   LAPAROSCOPIC VAGINAL HYSTERECTOMY WITH SALPINGO OOPHORECTOMY Bilateral 04/12/2020   Procedure: LAPAROSCOPIC ASSISTED VAGINAL HYSTERECTOMY WITH SALPINGO OOPHORECTOMY;  Surgeon: Janit Alm Agent, MD;  Location: [facility] ORS;  Service: Gynecology;  Laterality: Bilateral;   PAROTIDECTOMY Right 10/15/2019   Procedure: PAROTIDECTOMY;  Surgeon: Blair Mt, MD;  Location: [facility] ORS;  Service: ENT;  Laterality: Right;   TUBAL LIGATION     Social History   Social History Narrative   08/06/19   From: [place] , came here to be close to daughter   Living: with husband [name] together since 2011 and married since 2021   Work: not currently, use to work at [employer]      Family: 9 children  - in [place]  - ok relationship - 1 grandchild / with curtistine has 21 step grandchildren      Enjoys: walk, watch movies, painting, cross stitch      Exercise: walking   Diet: eats whatever      Safety   Seat belts: Yes    Guns: No   Safe in relationships: Yes    Caffiene: sprite, no caffiene      Immunization History  Administered Date(s) Administered   Influenza Inj Mdck Quad With Preservative 03/31/2022   Influenza,inj,Quad PF,6+ Mos 01/07/2020   Tdap 08/06/2019     Objective: Vital Signs: LMP 06/23/2015    Physical Exam   Musculoskeletal Exam: ***  CDAI Exam: CDAI  Score: -- Patient Global: --; Provider Global: -- Swollen: --; Tender: -- Joint Exam 03/04/2024   No joint exam has been documented for this visit   There is currently no information documented on the homunculus. Go to the Rheumatology activity and complete the homunculus joint exam.  Investigation: No additional findings.  Imaging: MM 3D DIAGNOSTIC MAMMOGRAM BILATERAL BREAST Result Date: 02/27/2024 CLINICAL DATA:  54 year old female here for follow-up of a right breast asymmetry and 2 left breast masses. EXAM: DIGITAL DIAGNOSTIC BILATERAL MAMMOGRAM WITH TOMOSYNTHESIS AND CAD; ULTRASOUND LEFT BREAST LIMITED TECHNIQUE: Bilateral digital diagnostic mammography and breast tomosynthesis was performed. The images were evaluated with computer-aided detection. ; Targeted ultrasound examination of the left breast was performed. COMPARISON:  Previous exam(s). ACR Breast Density Category b: There are scattered areas of fibroglandular density. FINDINGS: Right mammogram: The previously identified asymmetry about the upper-outer breast, middle third, is less conspicuous on today's exam and is felt to represent normal fibroglandular tissue. No findings suspicious for malignancy. Left mammogram: The previously identified small masses are less conspicuous on today's exam. No findings suspicious for malignancy. Left breast ultrasound: Targeted imaging at about the 2 o'clock position, approximately 12 centimeters from the nipple, demonstrates an oval hypoechoic mass measuring approximately 2 x 2 x 2 mm, previously measured 5 x 2 x 4 mm. Given the decreasing size, this likely represents a benign entity and this completes the BI-RADS 3 surveillance. IMPRESSION: Benign findings as above. RECOMMENDATION: Return to annual screening mammograms. I have discussed the findings and recommendations with the patient. If applicable, a reminder letter will be sent to the patient regarding the next appointment. BI-RADS CATEGORY  2:  Benign. Electronically Signed   By: [name] Noble   On: 02/27/2024 11:29   US  LIMITED ULTRASOUND INCLUDING AXILLA LEFT BREAST  Result Date: 02/27/2024 CLINICAL DATA:  53 year old female here for follow-up of a right breast asymmetry and 2 left breast masses. EXAM: DIGITAL DIAGNOSTIC BILATERAL MAMMOGRAM WITH TOMOSYNTHESIS AND CAD; ULTRASOUND LEFT BREAST LIMITED TECHNIQUE: Bilateral digital diagnostic mammography and breast tomosynthesis was performed. The images were evaluated with computer-aided detection. ; Targeted ultrasound examination of the left breast was performed. COMPARISON:  Previous exam(s). ACR Breast Density Category b: There are scattered areas of fibroglandular density. FINDINGS: Right mammogram: The previously identified asymmetry about the upper-outer breast, middle third, is less conspicuous on today's exam and is felt to represent normal fibroglandular  tissue. No findings suspicious for malignancy. Left mammogram: The previously identified small masses are less conspicuous on today's exam. No findings suspicious for malignancy. Left breast ultrasound: Targeted imaging at about the 2 o'clock position, approximately 12 centimeters from the nipple, demonstrates an oval hypoechoic mass measuring approximately 2 x 2 x 2 mm, previously measured 5 x 2 x 4 mm. Given the decreasing size, this likely represents a benign entity and this completes the BI-RADS 3 surveillance. IMPRESSION: Benign findings as above. RECOMMENDATION: Return to annual screening mammograms. I have discussed the findings and recommendations with the patient. If applicable, a reminder letter will be sent to the patient regarding the next appointment. BI-RADS CATEGORY  2: Benign. Electronically Signed   By: [name] Noble   On: 02/27/2024 11:29   ECHOCARDIOGRAM COMPLETE Result Date: 02/18/2024    ECHOCARDIOGRAM REPORT   Patient Name:   OVIDA DELAGARZA Date of Exam: 02/18/2024 Medical Rec #:  [id number]         Height:        68.0 in Accession #:    [id number]        Weight:       198.0 lb Date of Birth:  Sep 08, 1970         BSA:          2.035 m Patient Age:    53 years          BP:           124/72 mmHg Patient Gender: F                 HR:           70 bpm. Exam Location:  [facility] Procedure: 2D Echo, Cardiac Doppler, Color Doppler, 3D Echo and Strain Analysis            (Both Spectral and Color Flow Doppler were utilized during            procedure). Indications:     Dyspnea R06.00  History:         Patient has no prior history of Echocardiogram examinations.                  Risk Factors:Sleep Apnea. Anxiety, edema.  Sonographer:     Mckynlee Luse Furnace Referring Phys:  [id number] TABITHA DUGAL Diagnosing Phys: Deatrice Cage MD  Sonographer Comments: Global longitudinal strain was attempted. IMPRESSIONS  1. Left ventricular ejection fraction, by estimation, is 55 to 60%. The left ventricle has normal function. The left ventricle has no regional wall motion abnormalities. Left ventricular diastolic parameters were normal. The average left ventricular global longitudinal strain is 16.9 %. The global longitudinal strain is normal.  2. Right ventricular systolic function is normal. The right ventricular size is normal. Tricuspid regurgitation signal is inadequate for assessing PA pressure.  3. The mitral valve is normal in structure. Trivial mitral valve regurgitation. No evidence of mitral stenosis.  4. The aortic valve is normal in structure. Aortic valve regurgitation is not visualized. No aortic stenosis is present. FINDINGS  Left Ventricle: Left ventricular ejection fraction, by estimation, is 55 to 60%. The left ventricle has normal function. The left ventricle has no regional wall motion abnormalities. The average left ventricular global longitudinal strain is 16.9 %. Strain was performed and the global longitudinal strain is normal. The left ventricular internal cavity size was normal in size. There is no left ventricular hypertrophy. Left  ventricular diastolic parameters were normal. Right Ventricle: The right ventricular size  is normal. No increase in right ventricular wall thickness. Right ventricular systolic function is normal. Tricuspid regurgitation signal is inadequate for assessing PA pressure. Left Atrium: Left atrial size was normal in size. Right Atrium: Right atrial size was normal in size. Pericardium: There is no evidence of pericardial effusion. Mitral Valve: The mitral valve is normal in structure. Trivial mitral valve regurgitation. No evidence of mitral valve stenosis. MV peak gradient, 3.1 mmHg. The mean mitral valve gradient is 2.0 mmHg. Tricuspid Valve: The tricuspid valve is normal in structure. Tricuspid valve regurgitation is trivial. No evidence of tricuspid stenosis. Aortic Valve: The aortic valve is normal in structure. Aortic valve regurgitation is not visualized. No aortic stenosis is present. Aortic valve mean gradient measures 4.0 mmHg. Aortic valve peak gradient measures 6.2 mmHg. Aortic valve area, by VTI measures 2.56 cm. Pulmonic Valve: The pulmonic valve was normal in structure. Pulmonic valve regurgitation is not visualized. No evidence of pulmonic stenosis. Aorta: The aortic root is normal in size and structure. Venous: The inferior vena cava was not well visualized. IAS/Shunts: No atrial level shunt detected by color flow Doppler.  LEFT VENTRICLE PLAX 2D LVIDd:         5.20 cm   Diastology LVIDs:         3.50 cm   LV e' medial:    15.00 cm/s LV PW:         0.80 cm   LV E/e' medial:  6.6 LV IVS:        0.80 cm   LV e' lateral:   12.40 cm/s LVOT diam:     2.10 cm   LV E/e' lateral: 8.0 LV SV:         72 LV SV Index:   35        2D Longitudinal Strain LVOT Area:     3.46 cm  2D Strain GLS Avg:     16.9 %                           3D Volume EF:                          3D EF:        56 %                          LV EDV:       243 ml                          LV ESV:       107 ml                          LV SV:         137 ml RIGHT VENTRICLE RV Basal diam:  2.40 cm RV Mid diam:    2.80 cm LEFT ATRIUM             Index        RIGHT ATRIUM          Index LA diam:        2.70 cm 1.33 cm/m   RA Area:     8.24 cm LA Vol (A2C):   31.0 ml 15.23 ml/m  RA Volume:   15.60 ml 7.67 ml/m LA Vol (A4C):   27.4  ml 13.46 ml/m LA Biplane Vol: 29.2 ml 14.35 ml/m  AORTIC VALVE AV Area (Vmax):    2.60 cm AV Area (Vmean):   2.39 cm AV Area (VTI):     2.56 cm AV Vmax:           125.00 cm/s AV Vmean:          86.600 cm/s AV VTI:            0.281 m AV Peak Grad:      6.2 mmHg AV Mean Grad:      4.0 mmHg LVOT Vmax:         93.80 cm/s LVOT Vmean:        59.700 cm/s LVOT VTI:          0.208 m LVOT/AV VTI ratio: 0.74  AORTA Ao Root diam: 2.80 cm MITRAL VALVE               TRICUSPID VALVE MV Area (PHT): 3.83 cm    TR Peak grad:   15.1 mmHg MV Area VTI:   2.54 cm    TR Vmax:        194.00 cm/s MV Peak grad:  3.1 mmHg MV Mean grad:  2.0 mmHg    SHUNTS MV Vmax:       0.88 m/s    Systemic VTI:  0.21 m MV Vmean:      59.8 cm/s   Systemic Diam: 2.10 cm MV Decel Time: 198 msec MV E velocity: 99.00 cm/s MV A velocity: 54.50 cm/s MV E/A ratio:  1.82 Deatrice Cage MD Electronically signed by Deatrice Cage MD Signature Date/Time: 02/18/2024/12:27:38 PM    Final    DG Lumbar Spine Complete Result Date: 02/12/2024 EXAM: 4 VIEW(S) XRAY OF THE LUMBAR SPINE 02/07/2024 11:22:00 AM COMPARISON: None available. CLINICAL HISTORY: LBP. Degeneration of intervertebral disc of lumbar region. FINDINGS: LUMBAR SPINE: BONES: No acute fracture. No aggressive appearing osseous lesion. Alignment is normal. DISCS AND DEGENERATIVE CHANGES: No severe degenerative changes. SOFT TISSUES: Cholecystectomy clips are present. IMPRESSION: 1. No acute osseous abnormality. Electronically signed by: Pinkie Pebbles MD 02/12/2024 10:07 PM EDT RP Workstation: [id number]    Recent Labs: Lab Results  Component Value Date   WBC 4.0 02/21/2024   HGB 13.9 02/21/2024   PLT 180.0  02/21/2024   NA 142 01/15/2024   K 3.8 02/21/2024   CL 101 01/15/2024   CO2 36 (H) 01/15/2024   GLUCOSE 68 (L) 01/15/2024   BUN 8 01/15/2024   CREATININE 0.57 01/15/2024   BILITOT 0.3 01/15/2024   ALKPHOS 47 01/15/2024   AST 19 01/15/2024   ALT 13 01/15/2024   PROT 6.6 01/15/2024   ALBUMIN 3.7 01/15/2024   CALCIUM 9.0 01/15/2024   GFRAA >60 07/22/2015    Speciality Comments: No specialty comments available.  Procedures:  No procedures performed Allergies: Amoxicillin, Penicillins, and Zithromax  [azithromycin ]   Assessment / Plan:     Visit Diagnoses: No diagnosis found.  ***  Orders: No orders of the defined types were placed in this encounter.  No orders of the defined types were placed in this encounter.    Follow-Up Instructions: No follow-ups on file.   Lonni LELON Ester, MD  Note - This record has been created using [employer].  Chart creation errors have been sought, but may not always  have been located. Such creation errors do not reflect on  the standard of medical care.

## 2024-03-04 ENCOUNTER — Ambulatory Visit: Attending: Internal Medicine | Admitting: Internal Medicine

## 2024-03-04 ENCOUNTER — Encounter: Payer: Self-pay | Admitting: Internal Medicine

## 2024-03-04 VITALS — BP 105/60 | HR 84 | Temp 97.5°F | Resp 16 | Ht 69.0 in | Wt 196.4 lb

## 2024-03-04 DIAGNOSIS — R7982 Elevated C-reactive protein (CRP): Secondary | ICD-10-CM | POA: Insufficient documentation

## 2024-03-04 DIAGNOSIS — R7689 Other specified abnormal immunological findings in serum: Secondary | ICD-10-CM | POA: Insufficient documentation

## 2024-03-04 DIAGNOSIS — M5416 Radiculopathy, lumbar region: Secondary | ICD-10-CM | POA: Insufficient documentation

## 2024-03-04 DIAGNOSIS — R270 Ataxia, unspecified: Secondary | ICD-10-CM | POA: Diagnosis not present

## 2024-03-04 NOTE — Patient Instructions (Signed)
 Erin Dorsey

## 2024-03-05 LAB — C-REACTIVE PROTEIN: CRP: 18.7 mg/L — ABNORMAL HIGH (ref <8.0)

## 2024-03-09 ENCOUNTER — Other Ambulatory Visit: Payer: Self-pay | Admitting: Family

## 2024-03-09 DIAGNOSIS — F411 Generalized anxiety disorder: Secondary | ICD-10-CM

## 2024-03-09 DIAGNOSIS — F331 Major depressive disorder, recurrent, moderate: Secondary | ICD-10-CM

## 2024-03-10 ENCOUNTER — Encounter (INDEPENDENT_AMBULATORY_CARE_PROVIDER_SITE_OTHER): Admitting: Vascular Surgery

## 2024-03-11 ENCOUNTER — Other Ambulatory Visit: Payer: Self-pay | Admitting: Family

## 2024-03-12 ENCOUNTER — Encounter: Payer: Self-pay | Admitting: Neurosurgery

## 2024-03-12 ENCOUNTER — Ambulatory Visit (INDEPENDENT_AMBULATORY_CARE_PROVIDER_SITE_OTHER): Admitting: Neurosurgery

## 2024-03-12 ENCOUNTER — Encounter: Payer: Self-pay | Admitting: Family

## 2024-03-12 VITALS — BP 136/84 | Wt 199.0 lb

## 2024-03-12 DIAGNOSIS — G894 Chronic pain syndrome: Secondary | ICD-10-CM | POA: Diagnosis not present

## 2024-03-12 DIAGNOSIS — M47816 Spondylosis without myelopathy or radiculopathy, lumbar region: Secondary | ICD-10-CM

## 2024-03-12 DIAGNOSIS — M4726 Other spondylosis with radiculopathy, lumbar region: Secondary | ICD-10-CM | POA: Diagnosis not present

## 2024-03-12 DIAGNOSIS — M5416 Radiculopathy, lumbar region: Secondary | ICD-10-CM

## 2024-03-12 NOTE — Telephone Encounter (Signed)
 Pt has an in person appointment with Dr Claudene this afternoon.

## 2024-03-12 NOTE — Progress Notes (Signed)
 Referring Physician:  Corwin Antu, FNP [address] [place] [address] [place],  [place] [address]  Primary Physician:  Corwin Antu, FNP  Discussed the use of AI scribe software for clinical note transcription with the patient, who gave verbal consent to proceed.  History of Present Illness Erin Dorsey is a 53 year old female who presents with persistent back and leg pain.  She experiences persistent pain in her hip, back, and left foot, with swelling in the left foot. Severe pain radiates down the back of the right leg to the foot. The pain primarily affects the right side, particularly the tailbone, with nerve pain extending down the right leg. The left side is less affected currently but has caused her considerable discomfort recently.  She has been undergoing physical therapy intermittently for over a year and has received numerous injections for pain management. Despite these treatments, she continues to experience significant discomfort.  Previous diagnostic workup includes multiple MRIs and a nerve conduction study (EMG) indicating issues with the L5 and S1 nerves. MRI findings were described as mild to moderate, with no severe nerve impingement noted.  She quit smoking in 2021.   Bowel/Bladder Dysfunction: none, some bowel urgency since having her gallbladder removed.   Conservative measures:  Physical therapy: [facility] initial eval 09/10/23 Multimodal medical therapy including regular antiinflammatories: baclofen , gabapentin , prednisone  Injections:  11/08/23  bilateral L3, L4 medial branch and dorsal rami blocks with no significant relief  10/18/23 repeat bilateral L5-S1 TF ESI with celestone  09/27/23  L5-S1 TF ESI with no improvement   Past Surgery: no spinal surgeries  The symptoms are causing a significant impact on the patient's life.   Review of Systems:  A 10 point review of systems is negative, except for the pertinent positives and negatives detailed in  the HPI.  Past Medical History: Past Medical History:  Diagnosis Date   Allergy    Anxiety    Cervical cancer (HCC) 2011   stage 3   Chronic headaches    Depression    Edema 10/01/2023   Fatty liver    Frequent falls    GERD (gastroesophageal reflux disease)    Lumbar radiculopathy    Moderate obstructive sleep apnea 07/10/2023    Past Surgical History: Past Surgical History:  Procedure Laterality Date   ABDOMINAL HYSTERECTOMY     Total   ANTERIOR AND POSTERIOR REPAIR N/A 04/12/2020   Procedure: ANTERIOR (CYSTOCELE) AND POSTERIOR REPAIR (RECTOCELE);  Surgeon: Janit Alm Agent, MD;  Location: [facility] ORS;  Service: Gynecology;  Laterality: N/A;   BLADDER SUSPENSION N/A 04/12/2020   Procedure: [facility] PROCEDURE;  Surgeon: Janit Alm Agent, MD;  Location: [facility];  Service: Gynecology;  Laterality: N/A;  TOT   CHOLECYSTECTOMY N/A 11/30/2022   Procedure: LAPAROSCOPIC CHOLECYSTECTOMY;  Surgeon: Dasie Leonor CROME, MD;  Location: [facility] OR;  Service: General;  Laterality: N/A;   COLONOSCOPY WITH PROPOFOL  N/A 01/26/2021   Procedure: COLONOSCOPY WITH PROPOFOL ;  Surgeon: Dessa Reyes ORN, MD;  Location: [facility];  Service: Endoscopy;  Laterality: N/A;   COLPOSCOPY  2011 or 2012   stage 3-removed cancerous spot on the cervix   ESOPHAGOGASTRODUODENOSCOPY (EGD) WITH PROPOFOL  N/A 01/26/2021   Procedure: ESOPHAGOGASTRODUODENOSCOPY (EGD) WITH PROPOFOL ;  Surgeon: Dessa Reyes ORN, MD;  Location: [facility];  Service: Endoscopy;  Laterality: N/A;   EXCISION OF TONGUE LESION N/A 10/15/2019   Procedure: EXCISION OF TONGUE LESION;  Surgeon: Blair Mt, MD;  Location: [facility];  Service: ENT;  Laterality: N/A;  FOOT SURGERY Right    x 2-took knots out from bottom of the foot-not sure of the technical name   LAPAROSCOPIC VAGINAL HYSTERECTOMY WITH SALPINGO OOPHORECTOMY Bilateral 04/12/2020   Procedure: LAPAROSCOPIC ASSISTED VAGINAL HYSTERECTOMY WITH SALPINGO OOPHORECTOMY;  Surgeon: Janit Alm Agent, MD;  Location: [facility];  Service: Gynecology;  Laterality: Bilateral;   PAROTIDECTOMY Right 10/15/2019   Procedure: PAROTIDECTOMY;  Surgeon: Blair Mt, MD;  Location: [facility];  Service: ENT;  Laterality: Right;   TUBAL LIGATION      Allergies: Allergies as of 03/12/2024 - Review Complete 03/12/2024  Allergen Reaction Noted   Amoxicillin Rash 10/02/2019   Penicillins Rash 07/22/2015   Zithromax  [azithromycin ] Nausea And Vomiting and Rash 02/21/2024    Medications: Outpatient Encounter Medications as of 03/12/2024  Medication Sig   amitriptyline  (ELAVIL ) 25 MG tablet TAKE 1 TABLET BY MOUTH EVERYDAY AT BEDTIME   baclofen  (LIORESAL ) 10 MG tablet Take 1 tablet (10 mg total) by mouth 2 (two) times daily as needed for muscle spasms.   busPIRone  (BUSPAR ) 15 MG tablet TAKE 1 TABLET BY MOUTH 3 TIMES DAILY.   cetirizine  (ZYRTEC ) 10 MG tablet Take 1 tablet (10 mg total) by mouth daily.   clotrimazole  (CLOTRIMAZOLE  ANTI-FUNGAL) 1 % cream Apply 1 application twice daily for two weeks   clotrimazole -betamethasone  (LOTRISONE ) cream Apply 1 Application topically daily.   FLUoxetine  HCl 60 MG TABS TAKE 60 MG BY MOUTH DAILY.   fluticasone  (FLOVENT  HFA) 110 MCG/ACT inhaler Inhale 1 puff into the lungs in the morning and at bedtime.   furosemide  (LASIX ) 20 MG tablet TAKE 1 TABLET BY MOUTH EVERY DAY   gabapentin  (NEURONTIN ) 400 MG capsule Take 400 mg by mouth 3 (three) times daily.   Hydrocortisone  Acetate 1 % CREA Apply 1 application  topically daily as needed (heat rash).   hydrOXYzine  (ATARAX ) 25 MG tablet TAKE 1 TABLET BY MOUTH EVERYDAY AT BEDTIME   mupirocin  ointment (BACTROBAN ) 2 % APPLY TO AFFECTED AREA TOPICALLY EVERY DAY   nystatin  (MYCOSTATIN /NYSTOP ) powder APPLY TO AFFECTED AREA 3 TIMES A DAY   omeprazole  (PRILOSEC) 20 MG capsule TAKE 1 CAPSULE BY MOUTH EVERY DAY   ondansetron  (ZOFRAN ) 4 MG tablet TAKE 1 TABLET BY MOUTH EVERY 8 HOURS AS NEEDED FOR NAUSEA AND VOMITING    potassium chloride  (KLOR-CON  M) 10 MEQ tablet Take 1 tablet (10 mEq total) by mouth 2 (two) times daily for 5 days.   pravastatin (PRAVACHOL) 10 MG tablet Take 10 mg by mouth at bedtime.   promethazine  (PHENERGAN ) 25 MG tablet Take 25 mg by mouth every 6 (six) hours as needed for nausea or vomiting.   SUBOXONE 8-2 MG FILM as needed.   sulfamethoxazole-trimethoprim (BACTRIM DS) 800-160 MG tablet SMARTSIG:1 Tablet(s) By Mouth Every 12 Hours   valACYclovir  (VALTREX ) 500 MG tablet TAKE 2 TABLETS BY MOUTH EVERY DAY   VENTOLIN  HFA 108 (90 Base) MCG/ACT inhaler TAKE 2 PUFFS BY MOUTH EVERY 6 HOURS AS NEEDED FOR WHEEZE OR SHORTNESS OF BREATH   No facility-administered encounter medications on file as of 03/12/2024.    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   Drug use: Not Currently    Frequency: 1.0 times per week    Types: Marijuana    Comment: as needed    Family Medical History: Family History  Problem Relation Age of Onset   Hypertension Mother    Anxiety disorder Mother    Arthritis Mother    COPD Mother    Thyroid  disease Mother    Kidney disease Father        was on dialysis   Obesity Father    Uterine cancer Sister    Cervical cancer Sister    ADD / ADHD Brother    Cervical cancer Maternal Grandmother    Cancer Maternal Grandmother    Varicose Veins Maternal Grandmother    Lung cancer Maternal Grandfather    Brain cancer Maternal Grandfather    Cancer Maternal Grandfather    Deafness Daughter    Thyroid  disease Daughter    ADD / ADHD Daughter     Breast cancer Paternal Aunt     Physical Examination:  Awake, alert, oriented to person, place, and time.  Speech is clear and fluent. Fund of knowledge is appropriate.   Cranial Nerves: Pupils equal round and reactive to light.  Facial tone is symmetric.    No abnormal lesions on exposed skin.   Strength: Side Iliopsoas Quads Hamstring PF DF EHL  R 5 5 5 5 5 5   L 5 5 5 5 5 5    Reflexes are 2+ and symmetric at the patella and achilles.    Clonus is not present.   Bilateral lower extremity sensation is intact to light touch, but diminished mid calf to foot bilaterally.   Gait is normal.   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   Thank you for the referral of this patient. It was our privilege to participate in care of your patient. Feel free to contact us  with any further questions.  _____________________________ Jannett Fairly, M.D.   Narrative & Impression  MR LUMBAR SPINE WITHOUT IV CONTRAST   COMPARISON: 02/08/2021   CLINICAL HISTORY: Low back pain and right radiculopathy.   TECHNIQUE: SAG T2, SAG T1, SAG STIR, AX T2, AX T1 without IV contrast.   FINDINGS: There is stable alignment of the lumbar spine. Stable disc desiccation L5-S1 with mild caudal foraminal narrowing bilaterally. Mild facet arthrosis is present. There is a stable benign hemangioma in the L3 vertebral body. There is no vertebral body height loss, subluxation or marrow replacing process. The sacrum and SI joints are unremarkable so far as visualized. Conus and cauda equina are unremarkable.   T12-L1: There is no focal disc protrusion, foraminal or spinal stenosis.   L1-2: Mild disc desiccation and mild facet arthrosis. No significant foraminal or spinal stenosis.   L2-3:  Mild disc desiccation and mild facet arthrosis. No significant foraminal or spinal stenosis.   L3-4: There is no focal disc protrusion, foraminal or spinal stenosis. Mild-to-moderate facet arthrosis.   L4-5: There is no focal disc protrusion, foraminal or spinal stenosis. Mild-to-moderate facet arthrosis.   L5-S1: Disc desiccation and moderate facet arthrosis with slight caudal bronchograms bilaterally. No impingement of the exiting L5 nerves. No significant interval change.   The  retroperitoneal structures demonstrate no significant abnormality.   IMPRESSION: Stable disc desiccation L5-S1 with mild caudal foraminal narrowing bilaterally secondary to disc osteophyte. No significant interval change. No high-grade spinal or foraminal stenosis.   Electronically signed by: Norleen Satchel MD 08/29/2023 02:22 PM EDT RP Workstation: [id number]   Assessment and Plan Assessment & Plan Chronic low back pain with right leg radiculopathy due to lumbar spondylosis  MRI shows mild foraminal narrowing bilaterally and moderate arthrosis at L5-S1.  No high-grade nerve impingement currently.  EMG indicates nerve involvement atS1 bilaterally. Symptoms suggest chronic nerve irritation although imaging appearance shows improvement or lack of high grade compressive issues to the S1 nerve roots. - We discussed surgical interventions such as laminectomy discectomy versus transforaminal lumbar interbody fusion for full direct foraminal decompression.  However with her bilateral symptoms and her imaging showing no evidence of severe or moderate compression and her lack of improvement with focal nerve injections at these levels there is slight concern that she would not do well with a decompressive let alone a fixation/fusion surgery. - I do however feel that she may benefit from evaluation for spinal cord stimulator.  I will plan to get a thoracic MRI, I have referred her for psychology clearance for the spinal  cord stimulator, and will make a referral to our pain team.  Given her chronic S1 radiculopathies, chronic low back pain and chronic pain syndrome I would like to to evaluate whether or not she may be a candidate for this type of operation.

## 2024-03-13 ENCOUNTER — Ambulatory Visit (INDEPENDENT_AMBULATORY_CARE_PROVIDER_SITE_OTHER): Admitting: Vascular Surgery

## 2024-03-13 ENCOUNTER — Encounter (INDEPENDENT_AMBULATORY_CARE_PROVIDER_SITE_OTHER): Payer: Self-pay | Admitting: Vascular Surgery

## 2024-03-13 VITALS — BP 128/84 | HR 84 | Resp 16 | Ht 69.0 in | Wt 198.0 lb

## 2024-03-13 DIAGNOSIS — I10 Essential (primary) hypertension: Secondary | ICD-10-CM

## 2024-03-13 DIAGNOSIS — I890 Lymphedema, not elsewhere classified: Secondary | ICD-10-CM | POA: Diagnosis not present

## 2024-03-13 NOTE — Telephone Encounter (Signed)
 Can we have her bring in her jury duty letter? Or can she scan a copy into her chart I'll need the juror number as well as the dates

## 2024-03-14 ENCOUNTER — Telehealth: Payer: Self-pay

## 2024-03-14 ENCOUNTER — Other Ambulatory Visit (HOSPITAL_COMMUNITY): Payer: Self-pay

## 2024-03-14 NOTE — Addendum Note (Signed)
 Addended by: GIRARD DON GAILS on: 03/14/2024 02:41 PM   Modules accepted: Orders

## 2024-03-14 NOTE — Telephone Encounter (Signed)
 I tried to use OSA since she didn't have the BMI 40 that Zepbound requires.  Pharmacy Patient Advocate Encounter  Received notification from [facility] MEDICAID that Prior Authorization for Wegovy  0.5 has been DENIED.  See denial reason below. No denial letter attached in CMM. Will attach denial letter to Media tab once received.    PA #/Case ID/Reference #: # [id number]

## 2024-03-14 NOTE — Telephone Encounter (Signed)
 Pharmacy Patient Advocate Encounter   Received notification from Onbase that prior authorization for Wegovy  0.5 is required/requested.   Insurance verification completed.   The patient is insured through [facility] MEDICAID.   Per test claim: PA required; PA submitted to above mentioned insurance via Latent Key/confirmation #/[id number] Status is pending

## 2024-03-18 ENCOUNTER — Ambulatory Visit: Admitting: Family

## 2024-03-18 ENCOUNTER — Encounter (INDEPENDENT_AMBULATORY_CARE_PROVIDER_SITE_OTHER): Payer: Self-pay | Admitting: Vascular Surgery

## 2024-03-18 ENCOUNTER — Encounter: Payer: Self-pay | Admitting: Family

## 2024-03-18 VITALS — BP 124/70 | HR 84 | Temp 98.0°F | Ht 68.0 in | Wt 203.4 lb

## 2024-03-18 DIAGNOSIS — H60311 Diffuse otitis externa, right ear: Secondary | ICD-10-CM

## 2024-03-18 MED ORDER — CEFDINIR 300 MG PO CAPS: 300.0000 mg | ORAL_CAPSULE | Freq: Two times a day (BID) | ORAL | 0 refills | Status: AC

## 2024-03-18 NOTE — Progress Notes (Signed)
 Subjective:    Patient ID: Erin Dorsey, female    DOB: 1971/03/05, 53 y.o.   MRN: [id number] Chief Complaint  Patient presents with   New Patient (Initial Visit)    consult. Pedal edema  ref: Alejandrina, Raimer is a 53 yo female who presents to clinic today as a new patient consult for pedal edema.  Patient states that she normally has +2 edema to her feet which started a couple of months ago.  She said has progressively gotten worse.  She endorses that they went up on her Neurontin  to help with some of the pain to her lower extremities at that time.  She endorses when the swelling gets bad she has discoloration to her toes and her feet turning purple.  She denies using any conventional therapy at this time such as compression, rest, elevation and exercise.  She endorses that swelling can interfere with her daily activities of living.  On 01/17/2024 patient underwent arterial duplex ultrasounds with ABIs of her lower extremities.  They were all normal at that time.    Review of Systems  Constitutional: Negative.   Cardiovascular:  Positive for leg swelling.  Musculoskeletal:  Positive for myalgias.  Skin:  Positive for color change.  Neurological:  Positive for numbness.  All other systems reviewed and are negative.      Objective:   Physical Exam Vitals reviewed.  Constitutional:      Appearance: Normal appearance. She is obese.  HENT:     Head: Normocephalic.  Eyes:     Pupils: Pupils are equal, round, and reactive to light.  Cardiovascular:     Rate and Rhythm: Normal rate and regular rhythm.     Pulses: Normal pulses.     Heart sounds: Normal heart sounds.  Pulmonary:     Effort: Pulmonary effort is normal.     Breath sounds: Normal breath sounds.  Abdominal:     General: Bowel sounds are normal.     Palpations: Abdomen is soft.  Musculoskeletal:     Right lower leg: Edema present.     Left lower leg: Edema present.  Skin:    General: Skin is  warm and dry.     Capillary Refill: Capillary refill takes 2 to 3 seconds.  Neurological:     General: No focal deficit present.     Mental Status: She is alert and oriented to person, place, and time. Mental status is at baseline.  Psychiatric:        Mood and Affect: Mood normal.        Behavior: Behavior normal.        Thought Content: Thought content normal.        Judgment: Judgment normal.     BP 128/84   Pulse 84   Resp 16   Ht 5' 9 (1.753 m)   Wt 198 lb (89.8 kg)   LMP 06/23/2015   BMI 29.24 kg/m   Past Medical History:  Diagnosis Date   Allergy    Anxiety    Cervical cancer (HCC) 2011   stage 3   Chronic headaches    Depression    Edema 10/01/2023   Fatty liver    Frequent falls    GERD (gastroesophageal reflux disease)    Lumbar radiculopathy    Moderate obstructive sleep apnea 07/10/2023    Social History   Socioeconomic History   Marital status: Married    Spouse name: [name]   Number  of children: 3   Years of education: 7th grade   Highest education level: 7th grade  Occupational History    Employer: Not Employed  Tobacco Use   Smoking status: Former    Current packs/day: 0.00    Average packs/day: 0.3 packs/day for 20.0 years (5.0 ttl pk-yrs)    Types: Cigarettes    Start date: 09/01/1999    Quit date: 09/01/2019    Years since quitting: 4.5    Passive exposure: Past   Smokeless tobacco: Never   Tobacco comments:    not daily  Vaping Use   Vaping status: Never Used  Substance and Sexual Activity   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   Drug use: Not Currently    Frequency: 1.0 times per week    Types: Marijuana    Comment: as needed   Sexual activity: Yes    Partners: Male    Birth control/protection: Surgical    Comment: tubal ligation  Other Topics Concern   Not on file  Social History Narrative   08/06/19   From: [place] , came here to be close to daughter   Living: with husband [name] together since 2011 and married  since 2021   Work: not currently, use to work at [employer]      Family: 9 children  - in [place]  - ok relationship - 1 grandchild / with curtistine has 21 step grandchildren      Enjoys: walk, watch movies, painting, cross stitch      Exercise: walking   Diet: eats whatever      Safety   Seat belts: Yes    Guns: No   Safe in relationships: Yes    Caffiene: sprite, no caffiene      Social Drivers of Corporate Investment Banker Strain: High Risk (01/23/2024)   Overall Financial Resource Strain (CARDIA)    Difficulty of Paying Living Expenses: Very hard  Food Insecurity: Food Insecurity Present (01/23/2024)   Hunger Vital Sign    Worried About Running Out of Food in the Last Year: Often true    Ran Out of Food in the Last Year: Often true  Transportation Needs: Unmet Transportation Needs (01/23/2024)   PRAPARE - Transportation    Lack of Transportation (Medical): Yes    Lack of Transportation (Non-Medical): Yes  Physical Activity: Sufficiently Active (01/23/2024)   Exercise Vital Sign    Days of Exercise per Week: 5 days    Minutes of Exercise per Session: 30 min  Stress: Stress Concern Present (01/23/2024)   [employer] of Occupational Health - Occupational Stress Questionnaire    Feeling of Stress: Very much  Social Connections: Socially Isolated (01/23/2024)   Social Connection and Isolation Panel    Frequency of Communication with Friends and Family: Never    Frequency of Social Gatherings with Friends and Family: Never    Attends Religious Services: Never    Database Administrator or Organizations: No    Attends Engineer, Structural: Not on file    Marital Status: Married  Catering Manager Violence: Not on file    Past Surgical History:  Procedure Laterality Date   ABDOMINAL HYSTERECTOMY     Total   ANTERIOR AND POSTERIOR REPAIR N/A 04/12/2020   Procedure: ANTERIOR (CYSTOCELE) AND POSTERIOR REPAIR (RECTOCELE);  Surgeon: Janit Alm Agent, MD;  Location:  [facility] ORS;  Service: Gynecology;  Laterality: N/A;   BLADDER SUSPENSION N/A 04/12/2020   Procedure: [facility] PROCEDURE;  Surgeon:  Janit Alm Agent, MD;  Location: [facility] ORS;  Service: Gynecology;  Laterality: N/A;  TOT   CHOLECYSTECTOMY N/A 11/30/2022   Procedure: LAPAROSCOPIC CHOLECYSTECTOMY;  Surgeon: Dasie Leonor CROME, MD;  Location: [facility] OR;  Service: General;  Laterality: N/A;   COLONOSCOPY WITH PROPOFOL  N/A 01/26/2021   Procedure: COLONOSCOPY WITH PROPOFOL ;  Surgeon: Dessa Reyes ORN, MD;  Location: [facility] ENDOSCOPY;  Service: Endoscopy;  Laterality: N/A;   COLPOSCOPY  2011 or 2012   stage 3-removed cancerous spot on the cervix   ESOPHAGOGASTRODUODENOSCOPY (EGD) WITH PROPOFOL  N/A 01/26/2021   Procedure: ESOPHAGOGASTRODUODENOSCOPY (EGD) WITH PROPOFOL ;  Surgeon: Dessa Reyes ORN, MD;  Location: [facility] ENDOSCOPY;  Service: Endoscopy;  Laterality: N/A;   EXCISION OF TONGUE LESION N/A 10/15/2019   Procedure: EXCISION OF TONGUE LESION;  Surgeon: Blair Mt, MD;  Location: [facility] OR[facility];  Service: ENT;  Laterality: N/A;   FOOT SURGERY Right    x 2-took knots out from bottom of the foot-not sure of the technical name   LAPAROSCOPIC VAGINAL HYSTERECTOMY WITH SALPINGO OOPHORECTOMY Bilateral 04/12/2020   Procedure: LAPAROSCOPIC ASSISTED VAGINAL HYSTERECTOMY WITH SALPINGO OOPHORECTOMY;  Surgeon: Janit Alm Agent, MD;  Location: [facility];  Service: Gynecology;  Laterality: Bilateral;   PAROTIDECTOMY Right 10/15/2019   Procedure: PAROTIDECTOMY;  Surgeon: Blair Mt, MD;  Location: [facility] OR[facility];  Service: ENT;  Laterality: Right;   TUBAL LIGATION      Family History  Problem Relation Age of Onset   Hypertension Mother    Anxiety disorder Mother    Arthritis Mother    COPD Mother    Thyroid  disease Mother    Kidney disease Father        was on dialysis   Obesity Father    Uterine cancer Sister    Cervical cancer Sister    ADD / ADHD Brother    Cervical cancer Maternal Grandmother    Cancer Maternal  Grandmother    Varicose Veins Maternal Grandmother    Lung cancer Maternal Grandfather    Brain cancer Maternal Grandfather    Cancer Maternal Grandfather    Deafness Daughter    Thyroid  disease Daughter    ADD / ADHD Daughter    Breast cancer Paternal Aunt     Allergies  Allergen Reactions   Amoxicillin Rash   Penicillins Rash   Zithromax  [Azithromycin ] Nausea And Vomiting and Rash       Latest Ref Rng & Units 02/21/2024    1:21 PM 01/15/2024    9:03 AM 11/12/2023    9:13 AM  CBC  WBC 4.0 - 10.5 K/uL 4.0  3.9  3.8   Hemoglobin 12.0 - 15.0 g/dL 86.0  87.1  86.7   Hematocrit 36.0 - 46.0 % 42.2  38.5  39.6   Platelets 150.0 - 400.0 K/uL 180.0  208.0  201.0       CMP     Component Value Date/Time   NA 142 01/15/2024 0903   NA 142 05/17/2023 1105   K 3.8 02/21/2024 1321   CL 101 01/15/2024 0903   CO2 36 (H) 01/15/2024 0903   GLUCOSE 68 (L) 01/15/2024 0903   BUN 8 01/15/2024 0903   BUN 10 05/17/2023 1105   CREATININE 0.57 01/15/2024 0903   CALCIUM 9.0 01/15/2024 0903   PROT 6.6 01/15/2024 0903   PROT 6.7 05/17/2023 1105   ALBUMIN 3.7 01/15/2024 0903   ALBUMIN 4.1 05/17/2023 1105   AST 19 01/15/2024 0903   ALT 13 01/15/2024 0903   ALKPHOS 47 01/15/2024 0903  BILITOT 0.3 01/15/2024 0903   BILITOT 0.4 05/17/2023 1105   GFR 105.17 01/15/2024 0903   EGFR 107 05/17/2023 1105   GFRNONAA >60 11/23/2022 0820     No results found.     Assessment & Plan:   1. Lymphedema (Primary) Patient is currently taking Elavil  and Neurontin .  Patient endorsed there was a recent increase in her Neurontin  for her lower extremity neuropathies.  Both of these medications we discussed in detail and they commonly cause swelling to hands and feet.  I requested the patient return to her PCP and discussed the use of these medications related to her lower extremity edema.  Vascular surgery also recommends the following.   Recommend:  I have had a long discussion with the patient  regarding swelling and why it  causes symptoms.  Patient will begin wearing graduated compression on a daily basis a prescription was given. The patient will  wear the stockings first thing in the morning and removing them in the evening. The patient is instructed specifically not to sleep in the stockings.   In addition, behavioral modification will be initiated.  This will include frequent elevation, use of over the counter pain medications and exercise such as walking.  Consideration for a lymph pump will also be made based upon the effectiveness of conservative therapy.  This would help to improve the edema control and prevent sequela such as ulcers and infections   Patient should undergo duplex ultrasound of the venous system to ensure that DVT or reflux is not present.  The patient will follow-up with me after the ultrasound.   2. Essential hypertension Continue antihypertensive medications as already ordered, these medications have been reviewed and there are no changes at this time.   Current Outpatient Medications on File Prior to Visit  Medication Sig Dispense Refill   amitriptyline  (ELAVIL ) 25 MG tablet TAKE 1 TABLET BY MOUTH EVERYDAY AT BEDTIME 90 tablet 3   baclofen  (LIORESAL ) 10 MG tablet Take 1 tablet (10 mg total) by mouth 2 (two) times daily as needed for muscle spasms. 30 tablet 5   busPIRone  (BUSPAR ) 15 MG tablet TAKE 1 TABLET BY MOUTH 3 TIMES DAILY. 90 tablet 5   cetirizine  (ZYRTEC ) 10 MG tablet Take 1 tablet (10 mg total) by mouth daily. 90 tablet 3   clotrimazole  (CLOTRIMAZOLE  ANTI-FUNGAL) 1 % cream Apply 1 application twice daily for two weeks 30 g 0   clotrimazole -betamethasone  (LOTRISONE ) cream Apply 1 Application topically daily.     FLUoxetine  HCl 60 MG TABS TAKE 60 MG BY MOUTH DAILY. 90 tablet 3   fluticasone  (FLOVENT  HFA) 110 MCG/ACT inhaler Inhale 1 puff into the lungs in the morning and at bedtime. 1 each 12   furosemide  (LASIX ) 20 MG tablet TAKE 1 TABLET BY  MOUTH EVERY DAY 90 tablet 1   gabapentin  (NEURONTIN ) 400 MG capsule Take 400 mg by mouth 3 (three) times daily.     Hydrocortisone  Acetate 1 % CREA Apply 1 application  topically daily as needed (heat rash). 15 g 0   hydrOXYzine  (ATARAX ) 25 MG tablet TAKE 1 TABLET BY MOUTH EVERYDAY AT BEDTIME 30 tablet 5   mupirocin  ointment (BACTROBAN ) 2 % APPLY TO AFFECTED AREA TOPICALLY EVERY DAY 22 g 0   nystatin  (MYCOSTATIN /NYSTOP ) powder APPLY TO AFFECTED AREA 3 TIMES A DAY 15 g 5   omeprazole  (PRILOSEC) 20 MG capsule TAKE 1 CAPSULE BY MOUTH EVERY DAY 90 capsule 1   ondansetron  (ZOFRAN ) 4 MG tablet TAKE 1 TABLET BY  MOUTH EVERY 8 HOURS AS NEEDED FOR NAUSEA AND VOMITING 20 tablet 0   potassium chloride  (KLOR-CON  M) 10 MEQ tablet Take 1 tablet (10 mEq total) by mouth 2 (two) times daily for 5 days. 10 tablet 0   pravastatin (PRAVACHOL) 10 MG tablet Take 10 mg by mouth at bedtime.     promethazine  (PHENERGAN ) 25 MG tablet Take 25 mg by mouth every 6 (six) hours as needed for nausea or vomiting.     SUBOXONE 8-2 MG FILM as needed.     valACYclovir  (VALTREX ) 500 MG tablet TAKE 2 TABLETS BY MOUTH EVERY DAY 60 tablet 2   VENTOLIN  HFA 108 (90 Base) MCG/ACT inhaler TAKE 2 PUFFS BY MOUTH EVERY 6 HOURS AS NEEDED FOR WHEEZE OR SHORTNESS OF BREATH 18 each 2   No current facility-administered medications on file prior to visit.    There are no Patient Instructions on file for this visit. No follow-ups on file.   Gwendlyn JONELLE Shank, NP

## 2024-03-18 NOTE — Progress Notes (Signed)
 "  Established Patient Office Visit  Subjective:      CC:  Chief Complaint  Patient presents with   Acute Visit    Ear pain x1 month    df HPI: Erin Dorsey is a 53 y.o. female presenting on 03/18/2024 for Acute Visit (Ear pain x1 month) .  Discussed the use of AI scribe software for clinical note transcription with the patient, who gave verbal consent to proceed.  History of Present Illness Erin Dorsey is a 53 year old female who presents with ear pain.  She has been experiencing right ear pain for approximately one month. Over-the-counter ear drops with lidocaine  provide minimal relief. Previously, she was prescribed Bactrim, an oral antibiotic, which she took twice daily starting October 29th, but it did not alleviate her symptoms.  She notes swelling on the right side of her face and experiences a cramp that radiates up her shoulder, stopping at a specific point. No neck movement pain is reported.  She experiences anxiety and discomfort due to her condition, which is exacerbated by prolonged sitting, contributing to pedal edema. These symptoms make it difficult for her to sit still for long periods, such as during jury duty.  No fever, sinus pressure, or congestion.         Social history:  Relevant past medical, surgical, family and social history reviewed and updated as indicated. Interim medical history since our last visit reviewed.  Allergies and medications reviewed and updated.  DATA REVIEWED: CHART IN EPIC     ROS: Negative unless specifically indicated above in HPI.    Current Outpatient Medications:    amitriptyline  (ELAVIL ) 25 MG tablet, TAKE 1 TABLET BY MOUTH EVERYDAY AT BEDTIME, Disp: 90 tablet, Rfl: 3   baclofen  (LIORESAL ) 10 MG tablet, Take 1 tablet (10 mg total) by mouth 2 (two) times daily as needed for muscle spasms., Disp: 30 tablet, Rfl: 5   busPIRone  (BUSPAR ) 15 MG tablet, TAKE 1 TABLET BY MOUTH 3 TIMES DAILY., Disp: 90  tablet, Rfl: 5   cefdinir  (OMNICEF ) 300 MG capsule, Take 1 capsule (300 mg total) by mouth 2 (two) times daily for 7 days., Disp: 14 capsule, Rfl: 0   cetirizine  (ZYRTEC ) 10 MG tablet, Take 1 tablet (10 mg total) by mouth daily., Disp: 90 tablet, Rfl: 3   clotrimazole  (CLOTRIMAZOLE  ANTI-FUNGAL) 1 % cream, Apply 1 application twice daily for two weeks, Disp: 30 g, Rfl: 0   clotrimazole -betamethasone  (LOTRISONE ) cream, Apply 1 Application topically daily., Disp: , Rfl:    FLUoxetine  HCl 60 MG TABS, TAKE 60 MG BY MOUTH DAILY., Disp: 90 tablet, Rfl: 3   fluticasone  (FLOVENT  HFA) 110 MCG/ACT inhaler, Inhale 1 puff into the lungs in the morning and at bedtime., Disp: 1 each, Rfl: 12   furosemide  (LASIX ) 20 MG tablet, TAKE 1 TABLET BY MOUTH EVERY DAY, Disp: 90 tablet, Rfl: 1   gabapentin  (NEURONTIN ) 400 MG capsule, Take 400 mg by mouth 3 (three) times daily., Disp: , Rfl:    Hydrocortisone  Acetate 1 % CREA, Apply 1 application  topically daily as needed (heat rash)., Disp: 15 g, Rfl: 0   hydrOXYzine  (ATARAX ) 25 MG tablet, TAKE 1 TABLET BY MOUTH EVERYDAY AT BEDTIME, Disp: 30 tablet, Rfl: 5   mupirocin  ointment (BACTROBAN ) 2 %, APPLY TO AFFECTED AREA TOPICALLY EVERY DAY, Disp: 22 g, Rfl: 0   nystatin  (MYCOSTATIN /NYSTOP ) powder, APPLY TO AFFECTED AREA 3 TIMES A DAY, Disp: 15 g, Rfl: 5   omeprazole  (PRILOSEC) 20 MG  capsule, TAKE 1 CAPSULE BY MOUTH EVERY DAY, Disp: 90 capsule, Rfl: 1   ondansetron  (ZOFRAN ) 4 MG tablet, TAKE 1 TABLET BY MOUTH EVERY 8 HOURS AS NEEDED FOR NAUSEA AND VOMITING, Disp: 20 tablet, Rfl: 0   potassium chloride  (KLOR-CON  M) 10 MEQ tablet, Take 1 tablet (10 mEq total) by mouth 2 (two) times daily for 5 days., Disp: 10 tablet, Rfl: 0   pravastatin (PRAVACHOL) 10 MG tablet, Take 10 mg by mouth at bedtime., Disp: , Rfl:    promethazine  (PHENERGAN ) 25 MG tablet, Take 25 mg by mouth every 6 (six) hours as needed for nausea or vomiting., Disp: , Rfl:    SUBOXONE 8-2 MG FILM, as needed., Disp: ,  Rfl:    valACYclovir  (VALTREX ) 500 MG tablet, TAKE 2 TABLETS BY MOUTH EVERY DAY, Disp: 60 tablet, Rfl: 2   VENTOLIN  HFA 108 (90 Base) MCG/ACT inhaler, TAKE 2 PUFFS BY MOUTH EVERY 6 HOURS AS NEEDED FOR WHEEZE OR SHORTNESS OF BREATH, Disp: 18 each, Rfl: 2        Objective:        BP 124/70 (BP Location: Right Arm, Patient Position: Sitting, Cuff Size: Large)   Pulse 84   Temp 98 F (36.7 C) (Temporal)   Ht 5' 8 (1.727 m)   Wt 203 lb 6.4 oz (92.3 kg)   LMP 06/23/2015   SpO2 97%   BMI 30.93 kg/m   Physical Exam HEENT: Ears normal. Right sinus tender and swollen.  Wt Readings from Last 3 Encounters:  03/18/24 203 lb 6.4 oz (92.3 kg)  03/13/24 198 lb (89.8 kg)  03/12/24 199 lb (90.3 kg)    Physical Exam Vitals reviewed.  Constitutional:      General: She is not in acute distress.    Appearance: Normal appearance. She is normal weight. She is not ill-appearing, toxic-appearing or diaphoretic.  HENT:     Head: Normocephalic.     Right Ear: There is mastoid tenderness.  Cardiovascular:     Rate and Rhythm: Normal rate.  Pulmonary:     Effort: Pulmonary effort is normal.  Musculoskeletal:        General: Normal range of motion.  Neurological:     General: No focal deficit present.     Mental Status: She is alert and oriented to person, place, and time. Mental status is at baseline.  Psychiatric:        Mood and Affect: Mood normal.        Behavior: Behavior normal.        Thought Content: Thought content normal.        Judgment: Judgment normal.          Results   Assessment & Plan:   Assessment and Plan Assessment & Plan Right ear otitis externa Chronic right ear otitis externa with pain persisting for one month. Previous treatment with Bactrim was ineffective. Examination reveals manipulation pain and swelling in the right ear. No fever or sinus pressure reported. - Prescribed Septra 300 mg twice daily for 7 days - Sent prescription to on [address] - Advised to report any issues with the medication  Pedal edema Chronic pedal edema exacerbated by prolonged sitting, contributing to discomfort and anxiety.  Anxiety disorder Increased anxiety related to discomfort from pedal edema and prolonged sitting.        Return if symptoms worsen or fail to improve.     Ginger Patrick, MSN, APRN, FNP-C [facility]     "

## 2024-03-21 ENCOUNTER — Encounter: Payer: Self-pay | Admitting: Neurosurgery

## 2024-03-24 ENCOUNTER — Encounter: Payer: Self-pay | Admitting: Family

## 2024-03-24 DIAGNOSIS — F411 Generalized anxiety disorder: Secondary | ICD-10-CM

## 2024-03-28 ENCOUNTER — Other Ambulatory Visit: Payer: Self-pay | Admitting: Family

## 2024-03-28 DIAGNOSIS — R252 Cramp and spasm: Secondary | ICD-10-CM

## 2024-04-01 ENCOUNTER — Ambulatory Visit
Admission: RE | Admit: 2024-04-01 | Discharge: 2024-04-01 | Disposition: A | Source: Ambulatory Visit | Attending: Neurosurgery | Admitting: Neurosurgery

## 2024-04-01 DIAGNOSIS — M47816 Spondylosis without myelopathy or radiculopathy, lumbar region: Secondary | ICD-10-CM

## 2024-04-01 DIAGNOSIS — M5416 Radiculopathy, lumbar region: Secondary | ICD-10-CM

## 2024-04-01 DIAGNOSIS — G894 Chronic pain syndrome: Secondary | ICD-10-CM

## 2024-04-08 ENCOUNTER — Ambulatory Visit: Payer: Self-pay | Admitting: Neurosurgery

## 2024-04-10 ENCOUNTER — Ambulatory Visit

## 2024-04-10 DIAGNOSIS — B351 Tinea unguium: Secondary | ICD-10-CM

## 2024-04-10 DIAGNOSIS — E1340 Other specified diabetes mellitus with diabetic neuropathy, unspecified: Secondary | ICD-10-CM

## 2024-04-10 DIAGNOSIS — L84 Corns and callosities: Secondary | ICD-10-CM | POA: Diagnosis not present

## 2024-04-10 DIAGNOSIS — L989 Disorder of the skin and subcutaneous tissue, unspecified: Secondary | ICD-10-CM

## 2024-04-10 DIAGNOSIS — Q828 Other specified congenital malformations of skin: Secondary | ICD-10-CM | POA: Diagnosis not present

## 2024-04-10 NOTE — Progress Notes (Signed)
°  Subjective:  Patient ID: Erin Dorsey, female    DOB: Dec 30, 1970,  MRN: [id number]  53 y.o. female presents with chief concern of diabetes with elongated, thickened, painful, discolored toenails for weeks. Aggravating factor(s) include palpation. Patient has tried self attempt at trimming toenail. She additionally complains of several skin lesions that she finds painful. She does subjectively describe neuropathic sensations.   Chief Complaint  Patient presents with   Callouses    New pt-callus on feet -pain in feet they turn purple. Swelling in her left ankle area after a fall x 3 months ago. Denies being diabetic       PCP is Corwin Antu, FNP   Allergies[1]  Review of Systems: Negative except as noted in the HPI.   Objective:  Erin Dorsey is a pleasant 53 y.o. female in NAD. AAO x 3.  Vascular Examination: Vascular status intact b/l with palpable pedal pulses. CFT immediate b/l. Pedal hair present. No edema. No pain with calf compression b/l. Skin temperature gradient WNL b/l. No varicosities noted. No cyanosis or clubbing noted.  Neurological Examination: Sensation diminished b/l with 10 gram monofilament, 5/10 sites able to be sensed. Negative tinel sign at tarsal tunnel bilaterally.   Dermatological Examination: Pedal skin with normal turgor, texture and tone b/l. No open wounds nor interdigital macerations noted. Toenails 1-5 b/l thick, discolored, elongated with subungual debris and pain on dorsal palpation. Hyperkeratotic lesion with central core at plantar right 5th met head. Hyperkeratotic lesion without central core at bilateral medial hallux IPJ and left medial 1st MTP.   Musculoskeletal Examination: Muscle strength 5/5 to b/l LE.  No pain, crepitus noted b/l. No gross pedal deformities. Patient ambulates independently without assistive aids.   Radiographs: None Last A1c:       No data to display           Assessment:   1. Corns and callosities    2. Benign skin lesion   3. Porokeratosis   4. Neuropathy due to secondary diabetes (HCC)   5. Dermatophytosis of nail [B35.1]     Plan:  Diabetic foot examination performed today. All patient's and/or POA's questions/concerns addressed on today's visit. Toenails 1-5 b/l debrided in length and girth without incident. Continue foot and shoe inspections daily. Monitor blood glucose per PCP/Endocrinologist's recommendations. Continue soft, supportive shoe gear daily. Report any pedal injuries to medical professional. Call office if there are any questions/concerns. -Patient/POA to call should there be question/concern in the interim. - Hyperkeratotic lesions debrided. Dancer's applied to right foot to offload the 5th metatarsal head. If pad is affective, custom orthotics with build in dancer's pad will be considered.   RTC 3 months  Prentice Ovens, DPM AACFAS Fellowship Trained Podiatric Surgeon [facility]       [place] LOCATION: [address] [place] [address]                   Office [phone]   [facility] LOCATION: [address] [place], [place] [address] Office [phone]     [1]  Allergies Allergen Reactions   Amoxicillin Rash   Penicillins Rash   Zithromax  [Azithromycin ] Nausea And Vomiting and Rash

## 2024-04-14 MED ORDER — CIPROFLOXACIN-DEXAMETHASONE 0.3-0.1 % OT SUSP: 4.0000 [drp] | Freq: Two times a day (BID) | OTIC | 0 refills | Status: AC

## 2024-04-14 NOTE — Addendum Note (Signed)
 Addended by: CORWIN ANTU on: 04/14/2024 03:33 PM   Modules accepted: Orders

## 2024-04-19 ENCOUNTER — Other Ambulatory Visit: Payer: Self-pay | Admitting: Family

## 2024-04-19 DIAGNOSIS — H60311 Diffuse otitis externa, right ear: Secondary | ICD-10-CM

## 2024-05-05 ENCOUNTER — Encounter: Payer: Self-pay | Admitting: Family

## 2024-05-05 ENCOUNTER — Other Ambulatory Visit: Payer: Self-pay | Admitting: Family

## 2024-05-05 ENCOUNTER — Ambulatory Visit: Admitting: Student in an Organized Health Care Education/Training Program

## 2024-05-05 ENCOUNTER — Other Ambulatory Visit: Payer: Self-pay | Admitting: Student in an Organized Health Care Education/Training Program

## 2024-05-05 ENCOUNTER — Encounter: Payer: Self-pay | Admitting: Student in an Organized Health Care Education/Training Program

## 2024-05-05 ENCOUNTER — Ambulatory Visit
Admission: RE | Admit: 2024-05-05 | Discharge: 2024-05-05 | Disposition: A | Discharge status: Home / Self Care | Source: Ambulatory Visit | Attending: Student in an Organized Health Care Education/Training Program | Admitting: Student in an Organized Health Care Education/Training Program

## 2024-05-05 VITALS — HR 88 | Temp 98.6°F | Resp 16 | Ht 69.0 in | Wt 189.0 lb

## 2024-05-05 DIAGNOSIS — G8929 Other chronic pain: Secondary | ICD-10-CM | POA: Diagnosis present

## 2024-05-05 DIAGNOSIS — M51362 Other intervertebral disc degeneration, lumbar region with discogenic back pain and lower extremity pain: Secondary | ICD-10-CM | POA: Insufficient documentation

## 2024-05-05 DIAGNOSIS — M47816 Spondylosis without myelopathy or radiculopathy, lumbar region: Secondary | ICD-10-CM

## 2024-05-05 DIAGNOSIS — R52 Pain, unspecified: Secondary | ICD-10-CM | POA: Diagnosis present

## 2024-05-05 DIAGNOSIS — M5416 Radiculopathy, lumbar region: Secondary | ICD-10-CM | POA: Insufficient documentation

## 2024-05-05 DIAGNOSIS — G894 Chronic pain syndrome: Secondary | ICD-10-CM

## 2024-05-05 NOTE — Progress Notes (Signed)
 Safety precautions to be maintained throughout the outpatient stay will include: orient to surroundings, keep bed in low position, maintain call bell within reach at all times, provide assistance with transfer out of bed and ambulation.

## 2024-05-05 NOTE — Patient Instructions (Signed)
 [employer] brochure Psych eval brochure with my fax number Soap scrub

## 2024-05-05 NOTE — Progress Notes (Signed)
 PROVIDER NOTE: Interpretation of information contained herein should be left to medically-trained personnel. Specific patient instructions are provided elsewhere under Patient Instructions section of medical record. This document was created in part using AI and STT-dictation technology, any transcriptional errors that may result from this process are unintentional.  Patient: Erin Dorsey  Service: E/M Encounter  Provider: Wallie Sherry, MD  DOB: 1970-10-27  Delivery: Face-to-face  Specialty: Interventional Pain Management  MRN: [id number]  Setting: Ambulatory outpatient facility  Specialty designation: 09  Type: New Patient  Location: Outpatient office facility  PCP: Corwin Antu, FNP  DOS: 05/05/2024    Referring Prov.: Claudene Penne ORN, MD   Primary Reason(s) for Visit: Encounter for initial evaluation of one or more chronic problems (new to examiner) potentially causing chronic pain, and posing a threat to normal musculoskeletal function. (Level of risk: High) CC: Back Pain (LUMBAR BILATERAL )  HPI  Erin Dorsey is a 54 y.o. year old, female patient, who comes for the first time to our practice referred by Claudene Penne ORN, MD for our initial evaluation of her chronic pain. She has Hx of cervical cancer; Obesity (BMI 30.0-34.9); Essential hypertension; Genital herpes simplex; Generalized anxiety disorder; Seasonal allergic rhinitis due to pollen; Recurrent moderate major depressive disorder with anxiety (HCC); Calculus of gallbladder without cholecystitis without obstruction; Other chronic pain; Chronic nausea; Hyperglycemia; Chronic bilateral low back pain with bilateral sciatica; Cheilitis; Gastroesophageal reflux disease with esophagitis without hemorrhage; Bursitis of hip; History of bipolar disorder; Mixed hyperlipidemia; Polypharmacy; History of marijuana use; Vitamin B12 deficiency; Hypokalemia; Mild intermittent asthma without complication; Frequent falls; Other fatigue; Excessive daytime  sleepiness; Chronic right-sided low back pain with right-sided sciatica; Ataxia; Expressive aphasia; Spasticity; Weakness of right lower extremity; Stuttering; Moderate obstructive sleep apnea; Positive ANA (antinuclear antibody); Abnormal EEG; B12 deficiency; Intertrigo; Pedal edema; Elevated brain natriuretic peptide (BNP) level; DOE (dyspnea on exertion); Plantar wart of right foot; Prediabetes; Corns and callosities; Plantar wart, right foot; Asymptomatic varicose veins of both lower extremities; Lumbar spondylosis; Degeneration of intervertebral disc of lumbar region with discogenic back pain and lower extremity pain; Lumbar radiculopathy; and CRP elevated on their problem list. Today she comes in for evaluation of her Back Pain (LUMBAR BILATERAL )  Pain Assessment: Location: Lower, Left, Right Back Radiating: into hips and legs bilaterally Onset: More than a month ago Duration: Chronic pain Quality: Discomfort, Aching, Sharp, Constant Severity: 10-Worst pain ever/10 (subjective, self-reported pain score)  Effect on ADL: does not want to do anything d/t the pain and fatigue Timing: Constant Modifying factors: nothing currently, has used multiple topicals that do not help BP:    HR: 88  Onset and Duration: Present longer than 3 months Cause of pain: Unknown Severity: No change since onset, NAS-11 at its worse: 10/10, and NAS-11 now: 10/10 Timing: Morning, Afternoon, Night, During activity or exercise, After activity or exercise, and After a period of immobility Aggravating Factors: Kneeling, Lifiting, Prolonged sitting, and Prolonged standing Alleviating Factors: Warm showers or baths and physical therapy  Associated Problems: Color changes, Constipation, Day-time cramps, Night-time cramps, Depression, Dizziness, Fatigue, Inability to concentrate, Nausea, Numbness, Sadness, Spasms, Sweating, Swelling, Tingling, Vomiting , Weakness, Pain that wakes patient up, and Pain that does not allow  patient to sleep Quality of Pain: Aching, Annoying, Burning, Constant, Cramping, Dull, Getting longer, Heavy, Horrible, Nagging, Pressure-like, Sharp, Shooting, Splitting, Stabbing, Tender, Throbbing, Tingling, Toothache-like, and Uncomfortable Previous Examinations or Tests: Bone scan, CT scan, Ct-Myelogram, MRI scan, Nerve block, Spinal tap, X-rays, Nerve conduction test, Neurological  evaluation, Neurosurgical evaluation, and Chiropractic evaluation Previous Treatments: Relaxation therapy and Stretching exercises  Erin Dorsey is being evaluated for possible interventional pain management therapies for the treatment of her chronic pain.   Discussed the use of AI scribe software for clinical note transcription with the patient, who gave verbal consent to proceed.  History of Present Illness   The patient presents with chronic back and leg pain for consideration of a spinal cord stimulator trial.  They have been experiencing chronic back and leg pain, with the left leg being worse. Severe pain radiates down the back of the right leg to the foot. The pain primarily affects the right side, particularly the tailbone, with nerve pain extending down the right leg. The left side is less affected currently but has caused her considerable discomfort recently.  The pain has persisted despite various treatments, including epidural injections and nerve blocks, and even workup of referred pain via SI joint injection but these interventions did not provide relief.  The pain significantly affects their mobility, causing difficulty in moving the hip and sometimes requiring assistance to move both legs.  An MRI of the lower back was performed within the last six months, along with an MRI of the thoracic spine, as part of the workup for SCS.  Previous diagnostic workup includes multiple MRIs and a nerve conduction study (EMG) indicating issues with the L5 and S1 nerves. MRI findings were described as mild to moderate    Physical therapy: [facility] initial eval 09/10/23 Multimodal medical therapy including regular antiinflammatories: baclofen , gabapentin , prednisone  Injections:  11/08/23  bilateral L3, L4 medial branch and dorsal rami blocks with no significant relief  10/18/23 repeat bilateral L5-S1 TF ESI with celestone  09/27/23  L5-S1 TF ESI with no improvement        Historical Monitoring: The patient  reports that she does not currently use drugs after having used the following drugs: Marijuana. Frequency: 1.00 time per week. List of prior UDS Testing: Lab Results  Component Value Date   MDMA NONE DETECTED 04/12/2020   MDMA NONE DETECTED 10/15/2019   COCAINSCRNUR NONE DETECTED 04/12/2020   COCAINSCRNUR NONE DETECTED 10/15/2019   PCPSCRNUR NONE DETECTED 04/12/2020   PCPSCRNUR NONE DETECTED 10/15/2019   THCU POSITIVE (A) 04/12/2020   THCU POSITIVE (A) 10/15/2019   Historical Background Evaluation: [place] PMP: PDMP not reviewed this encounter. Review of the past 70-months conducted.              [place] Department of public safety, offender search: Engineer, [employer]) Non-contributory Risk Assessment Profile: Aberrant behavior: None observed or detected today Risk factors for fatal opioid overdose: None identified today Fatal overdose hazard ratio (HR): Calculation deferred Non-fatal overdose hazard ratio (HR): Calculation deferred Risk of opioid abuse or dependence: 0.7-3.0% with doses <= 36 MME/day and 6.1-26% with doses >= 120 MME/day.  Meds  Current Medications[1]  Imaging Review  Thoracic Imaging: Thoracic MR wo contrast: Results for orders placed during the hospital encounter of 04/01/24  MR THORACIC SPINE WO CONTRAST  Narrative EXAM: MRI THORACIC SPINE WITHOUT INTRAVENOUS CONTRAST 04/01/2024 05:47:00 PM  TECHNIQUE: Multiplanar multisequence MRI of the thoracic spine was performed without the administration of intravenous contrast.  COMPARISON: None available.  CLINICAL  HISTORY: Evaluate for spinal cord stimulation placement, chronic pain syndrome, chronic mid back pain radiating down the back of the right leg to the foot, lumbar radiculopathy.  FINDINGS:  BONES AND ALIGNMENT: Normal alignment. Normal vertebral body heights. Bone marrow signal is unremarkable. No abnormal enhancement.  SPINAL CORD: Normal spinal cord volume. Normal spinal cord signal.  SOFT TISSUES: Unremarkable.  DEGENERATIVE CHANGES: Mild disc bulging at multiple levels. No spinal canal stenosis or neural foraminal narrowing.  IMPRESSION: 1. No spinal canal stenosis or neural foraminal narrowing in the thoracic spine.  Electronically signed by: Gilmore Molt MD 04/07/2024 12:06 AM EST RP Workstation: [id number]    Lumbosacral Imaging: Lumbar MR wo contrast: Results for orders placed during the hospital encounter of 08/11/23  MR LUMBAR SPINE WO CONTRAST  Narrative MR LUMBAR SPINE WITHOUT IV CONTRAST  COMPARISON: 02/08/2021  CLINICAL HISTORY: Low back pain and right radiculopathy.  TECHNIQUE: SAG T2, SAG T1, SAG STIR, AX T2, AX T1 without IV contrast.  FINDINGS: There is stable alignment of the lumbar spine. Stable disc desiccation L5-S1 with mild caudal foraminal narrowing bilaterally. Mild facet arthrosis is present. There is a stable benign hemangioma in the L3 vertebral body. There is no vertebral body height loss, subluxation or marrow replacing process. The sacrum and SI joints are unremarkable so far as visualized. Conus and cauda equina are unremarkable.  T12-L1: There is no focal disc protrusion, foraminal or spinal stenosis.  L1-2: Mild disc desiccation and mild facet arthrosis. No significant foraminal or spinal stenosis.  L2-3: Mild disc desiccation and mild facet arthrosis. No significant foraminal or spinal stenosis.  L3-4: There is no focal disc protrusion, foraminal or spinal stenosis. Mild-to-moderate facet arthrosis.  L4-5: There  is no focal disc protrusion, foraminal or spinal stenosis. Mild-to-moderate facet arthrosis.  L5-S1: Disc desiccation and moderate facet arthrosis with slight caudal bronchograms bilaterally. No impingement of the exiting L5 nerves. No significant interval change.  The retroperitoneal structures demonstrate no significant abnormality.  IMPRESSION: Stable disc desiccation L5-S1 with mild caudal foraminal narrowing bilaterally secondary to disc osteophyte. No significant interval change. No high-grade spinal or foraminal stenosis.  Electronically signed by: Norleen Satchel MD 08/29/2023 02:22 PM EDT RP Workstation: [id number]   DG Lumbar Spine 2-3 Views  Narrative CLINICAL DATA:  Chronic low back pain.  EXAM: LUMBAR SPINE - 2-3 VIEW  COMPARISON:  February 01, 2021  FINDINGS: There is no evidence of lumbar spine fracture. Alignment is normal. Moderate narrow intervertebral space with facet joint sclerosis and vacuum disc phenomenon at L5-S1.  IMPRESSION: Moderate degenerative joint changes at L5-S1.   Electronically Signed By: Craig Farr M.D. On: 05/17/2023 14:06  Lumbar DG (Complete) 4+V: Results for orders placed in visit on 02/07/24  DG Lumbar Spine Complete  Narrative EXAM: 4 VIEW(S) XRAY OF THE LUMBAR SPINE 02/07/2024 11:22:00 AM  COMPARISON: None available.  CLINICAL HISTORY: LBP. Degeneration of intervertebral disc of lumbar region.  FINDINGS:  LUMBAR SPINE: BONES: No acute fracture. No aggressive appearing osseous lesion. Alignment is normal.  DISCS AND DEGENERATIVE CHANGES: No severe degenerative changes.  SOFT TISSUES: Cholecystectomy clips are present.  IMPRESSION: 1. No acute osseous abnormality.  Electronically signed by: Pinkie Pebbles MD 02/12/2024 10:07 PM EDT RP Workstation: [id number]  DG HIP UNILAT WITH PELVIS 2-3 VIEWS RIGHT  Narrative CLINICAL DATA:  Chronic right hip pain.  EXAM: DG HIP (WITH OR WITHOUT PELVIS) 2-3V  RIGHT  COMPARISON:  August 06, 2019  FINDINGS: There is no evidence of hip fracture or dislocation. There is no evidence of arthropathy or other focal bone abnormality.  IMPRESSION: Negative.   Electronically Signed By: Craig Farr M.D. On: 05/17/2023 14:06   DG Ankle Complete Right  Narrative CLINICAL DATA:  Bilateral foot and ankle pain after fall.  EXAM: RIGHT FOOT  COMPLETE - 3+ VIEW; RIGHT ANKLE - COMPLETE 3+ VIEW  COMPARISON:  None Available.  FINDINGS: No acute fracture or dislocation. Ankle mortise is congruent. Mild first MTP joint space narrowing. Mild degenerative changes at the calcaneocuboid articulation. Plantar calcaneal spur. Diffuse nonspecific subcutaneous edema of the right lower extremity.  IMPRESSION: 1. No acute osseous abnormality. 2. Diffuse nonspecific subcutaneous edema of the right lower extremity.   Electronically Signed By: Harrietta Sherry M.D. On: 09/26/2023 15:04  Ankle-L DG Complete: Results for orders placed during the hospital encounter of 09/26/23  DG Ankle Complete Left  Narrative CLINICAL DATA:  Bilateral foot and ankle pain after fall.  EXAM: LEFT FOOT - COMPLETE 3+ VIEW; LEFT ANKLE COMPLETE - 3+ VIEW  COMPARISON:  None Available.  FINDINGS: No acute fracture or dislocation. Ankle mortise is congruent. Mild first MTP joint space narrowing. Plantar calcaneal spur. Diffuse nonspecific subcutaneous edema of the left lower extremity.  IMPRESSION: 1. No acute osseous abnormality. 2. Diffuse nonspecific subcutaneous edema of the left lower extremity.   Electronically Signed By: Harrietta Sherry M.D. On: 09/26/2023 15:01   Foot Imaging: Foot-R DG Complete: Results for orders placed during the hospital encounter of 09/26/23  DG Foot Complete Right  Narrative CLINICAL DATA:  Bilateral foot and ankle pain after fall.  EXAM: RIGHT FOOT COMPLETE - 3+ VIEW; RIGHT ANKLE - COMPLETE 3+ VIEW  COMPARISON:  None  Available.  FINDINGS: No acute fracture or dislocation. Ankle mortise is congruent. Mild first MTP joint space narrowing. Mild degenerative changes at the calcaneocuboid articulation. Plantar calcaneal spur. Diffuse nonspecific subcutaneous edema of the right lower extremity.  IMPRESSION: 1. No acute osseous abnormality. 2. Diffuse nonspecific subcutaneous edema of the right lower extremity.   Electronically Signed By: Harrietta Sherry M.D. On: 09/26/2023 15:04  Foot-L DG Complete: Results for orders placed during the hospital encounter of 09/26/23  DG Foot Complete Left  Narrative CLINICAL DATA:  Bilateral foot and ankle pain after fall.  EXAM: LEFT FOOT - COMPLETE 3+ VIEW; LEFT ANKLE COMPLETE - 3+ VIEW  COMPARISON:  None Available.  FINDINGS: No acute fracture or dislocation. Ankle mortise is congruent. Mild first MTP joint space narrowing. Plantar calcaneal spur. Diffuse nonspecific subcutaneous edema of the left lower extremity.  IMPRESSION: 1. No acute osseous abnormality. 2. Diffuse nonspecific subcutaneous edema of the left lower extremity.   Electronically Signed By: Harrietta Sherry M.D. On: 09/26/2023 15:01  Complexity Note: Imaging results reviewed.                         ROS  Cardiovascular: No reported cardiovascular signs or symptoms such as High blood pressure, coronary artery disease, abnormal heart rate or rhythm, heart attack, blood thinner therapy or heart weakness and/or failure Pulmonary or Respiratory: Wheezing and difficulty taking a deep full breath (Asthma), Shortness of breath, Snoring , Coughing up mucus (Bronchitis), and Temporary stoppage of breathing during sleep Neurological: Abnormal skin sensations (Peripheral Neuropathy) Psychological-Psychiatric: Anxiousness, Depressed, Prone to panicking, History of abuse, and Difficulty sleeping and or falling asleep Gastrointestinal: Reflux or heatburn, Inflamed liver (Hepatitis), and  Irregular, infrequent bowel movements (Constipation) Genitourinary: No reported renal or genitourinary signs or symptoms such as difficulty voiding or producing urine, peeing blood, non-functioning kidney, kidney stones, difficulty emptying the bladder, difficulty controlling the flow of urine, or chronic kidney disease Hematological: Brusing easily Endocrine: No reported endocrine signs or symptoms such as high or low blood sugar, rapid heart rate due to high  thyroid  levels, obesity or weight gain due to slow thyroid  or thyroid  disease Rheumatologic: Rheumatoid arthritis Musculoskeletal: Negative for myasthenia gravis, muscular dystrophy, multiple sclerosis or malignant hyperthermia Work History: Quit going to work on his/her own  Allergies  Ms. Sonnen is allergic to amoxicillin, penicillins, and zithromax  [azithromycin ].  Laboratory Chemistry Profile   Renal Lab Results  Component Value Date   BUN 8 01/15/2024   CREATININE 0.57 01/15/2024   BCR 15 05/17/2023   GFR 105.17 01/15/2024   GFRAA >60 07/22/2015   GFRNONAA >60 11/23/2022   SPECGRAV 1.010 02/17/2020   PHUR 7.0 02/17/2020   PROTEINUR NEGATIVE 10/01/2022     Electrolytes Lab Results  Component Value Date   NA 142 01/15/2024   K 3.8 02/21/2024   CL 101 01/15/2024   CALCIUM 9.0 01/15/2024     Hepatic Lab Results  Component Value Date   AST 19 01/15/2024   ALT 13 01/15/2024   ALBUMIN 3.7 01/15/2024   ALKPHOS 47 01/15/2024   LIPASE 27 10/01/2022     ID Lab Results  Component Value Date   HIV Non Reactive 05/17/2023   SARSCOV2NAA NEGATIVE 10/01/2022   PREGTESTUR NEGATIVE 04/12/2020     Bone Lab Results  Component Value Date   VD25OH 31.3 05/17/2023     Endocrine Lab Results  Component Value Date   GLUCOSE 68 (L) 01/15/2024   GLUCOSEU NEGATIVE 10/01/2022   HGBA1C 6.0 08/29/2022   TSH 3.860 05/17/2023     Neuropathy Lab Results  Component Value Date   VITAMINB12 419 11/12/2023   HGBA1C 6.0  08/29/2022   HIV Non Reactive 05/17/2023     CNS No results found for: COLORCSF, APPEARCSF, RBCCOUNTCSF, WBCCSF, POLYSCSF, LYMPHSCSF, EOSCSF, PROTEINCSF, GLUCCSF, JCVIRUS, CSFOLI, IGGCSF, LABACHR, ACETBL   Inflammation (CRP: Acute  ESR: Chronic) Lab Results  Component Value Date   CRP 18.7 (H) 03/04/2024   ESRSEDRATE 14 12/17/2023     Rheumatology Lab Results  Component Value Date   ANA Positive (A) 05/17/2023     Coagulation Lab Results  Component Value Date   PLT 180.0 02/21/2024     Cardiovascular Lab Results  Component Value Date   CKTOTAL 66 05/17/2023   HGB 13.9 02/21/2024   HCT 42.2 02/21/2024     Screening Lab Results  Component Value Date   SARSCOV2NAA NEGATIVE 10/01/2022   HIV Non Reactive 05/17/2023   PREGTESTUR NEGATIVE 04/12/2020     Cancer No results found for: CEA, CA125, LABCA2   Allergens No results found for: ALMOND, APPLE, ASPARAGUS, AVOCADO, BANANA, BARLEY, BASIL, BAYLEAF, GREENBEAN, LIMABEAN, WHITEBEAN, BEEFIGE, REDBEET, BLUEBERRY, BROCCOLI, CABBAGE, MELON, CARROT, CASEIN, CASHEWNUT, CAULIFLOWER, CELERY     Note: Lab results reviewed.  PFSH  Drug: Ms. Dominique  reports that she does not currently use drugs after having used the following drugs: Marijuana. Frequency: 1.00 time per week. Alcohol:  reports no history of alcohol use. Tobacco:  reports that she quit smoking about 4 years ago. Her smoking use included cigarettes. She started smoking about 24 years ago. She has a 5 pack-year smoking history. She has been exposed to tobacco smoke. She has never used smokeless tobacco. Medical:  has a past medical history of Allergy, Anxiety, Cervical cancer (HCC) (2011), Chronic headaches, Depression, Edema (10/01/2023), Fatty liver, Frequent falls, GERD (gastroesophageal reflux disease), Lumbar radiculopathy, and Moderate obstructive sleep apnea (07/10/2023). Family: family  history includes ADD / ADHD in her brother and daughter; Anxiety disorder in her mother; Arthritis in her mother; Brain cancer in her  maternal grandfather; Breast cancer in her paternal aunt; COPD in her mother; Cancer in her maternal grandfather and maternal grandmother; Cervical cancer in her maternal grandmother and sister; Deafness in her daughter; Hypertension in her mother; Kidney disease in her father; Lung cancer in her maternal grandfather; Obesity in her father; Thyroid  disease in her daughter and mother; Uterine cancer in her sister; Varicose Veins in her maternal grandmother.  Past Surgical History:  Procedure Laterality Date   ABDOMINAL HYSTERECTOMY     Total   ANTERIOR AND POSTERIOR REPAIR N/A 04/12/2020   Procedure: ANTERIOR (CYSTOCELE) AND POSTERIOR REPAIR (RECTOCELE);  Surgeon: Janit Alm Agent, MD;  Location: [facility] ORS;  Service: Gynecology;  Laterality: N/A;   BLADDER SUSPENSION N/A 04/12/2020   Procedure: [facility] PROCEDURE;  Surgeon: Janit Alm Agent, MD;  Location: [facility] OR[facility];  Service: Gynecology;  Laterality: N/A;  TOT   CHOLECYSTECTOMY N/A 11/30/2022   Procedure: LAPAROSCOPIC CHOLECYSTECTOMY;  Surgeon: Dasie Leonor CROME, MD;  Location: [facility] OR;  Service: General;  Laterality: N/A;   COLONOSCOPY WITH PROPOFOL  N/A 01/26/2021   Procedure: COLONOSCOPY WITH PROPOFOL ;  Surgeon: Dessa Reyes ORN, MD;  Location: [facility];  Service: Endoscopy;  Laterality: N/A;   COLPOSCOPY  2011 or 2012   stage 3-removed cancerous spot on the cervix   ESOPHAGOGASTRODUODENOSCOPY (EGD) WITH PROPOFOL  N/A 01/26/2021   Procedure: ESOPHAGOGASTRODUODENOSCOPY (EGD) WITH PROPOFOL ;  Surgeon: Dessa Reyes ORN, MD;  Location: [facility] END[facility];  Service: Endoscopy;  Laterality: N/A;   EXCISION OF TONGUE LESION N/A 10/15/2019   Procedure: EXCISION OF TONGUE LESION;  Surgeon: Blair Mt, MD;  Location: [facility] OR[facility];  Service: ENT;  Laterality: N/A;   FOOT SURGERY Right    x 2-took knots out from bottom of the  foot-not sure of the technical name   LAPAROSCOPIC VAGINAL HYSTERECTOMY WITH SALPINGO OOPHORECTOMY Bilateral 04/12/2020   Procedure: LAPAROSCOPIC ASSISTED VAGINAL HYSTERECTOMY WITH SALPINGO OOPHORECTOMY;  Surgeon: Janit Alm Agent, MD;  Location: [facility] OR[facility];  Service: Gynecology;  Laterality: Bilateral;   PAROTIDECTOMY Right 10/15/2019   Procedure: PAROTIDECTOMY;  Surgeon: Blair Mt, MD;  Location: [facility] OR[facility];  Service: ENT;  Laterality: Right;   TUBAL LIGATION     Active Ambulatory Problems    Diagnosis Date Noted   Hx of cervical cancer 08/06/2019   Obesity (BMI 30.0-34.9) 08/06/2019   Essential hypertension 08/06/2019   Genital herpes simplex 01/12/2020   Generalized anxiety disorder 02/03/2020   Seasonal allergic rhinitis due to pollen 08/17/2020   Recurrent moderate major depressive disorder with anxiety (HCC) 04/27/2021   Calculus of gallbladder without cholecystitis without obstruction 04/27/2021   Other chronic pain 04/27/2021   Chronic nausea 08/29/2022   Hyperglycemia 08/29/2022   Chronic bilateral low back pain with bilateral sciatica 08/29/2022   Cheilitis 08/29/2022   Gastroesophageal reflux disease with esophagitis without hemorrhage 08/29/2022   Bursitis of hip 08/29/2022   History of bipolar disorder 08/29/2022   Mixed hyperlipidemia 08/29/2022   Polypharmacy 08/29/2022   History of marijuana use 11/28/2022   Vitamin B12 deficiency 11/28/2022   Hypokalemia 11/28/2022   Mild intermittent asthma without complication 11/28/2022   Frequent falls 01/15/2023   Other fatigue 01/15/2023   Excessive daytime sleepiness 01/15/2023   Chronic right-sided low back pain with right-sided sciatica 05/07/2023   Ataxia 05/07/2023   Expressive aphasia 05/07/2023   Spasticity 05/07/2023   Weakness of right lower extremity 05/07/2023   Stuttering 05/17/2023   Moderate obstructive sleep apnea 07/10/2023   Positive ANA (antinuclear antibody) 07/16/2023   Abnormal EEG 07/16/2023  B12 deficiency 11/12/2023   Intertrigo 11/12/2023   Pedal edema 11/12/2023   Elevated brain natriuretic peptide (BNP) level 11/12/2023   DOE (dyspnea on exertion) 11/12/2023   Plantar wart of right foot 01/15/2024   Prediabetes 01/15/2024   Corns and callosities 02/04/2024   Plantar wart, right foot 02/04/2024   Asymptomatic varicose veins of both lower extremities 02/21/2024   Lumbar spondylosis 02/22/2024   Degeneration of intervertebral disc of lumbar region with discogenic back pain and lower extremity pain 02/22/2024   Lumbar radiculopathy 02/22/2024   CRP elevated 03/04/2024   Resolved Ambulatory Problems    Diagnosis Date Noted   Tongue lesion 08/06/2019   Tobacco use 08/06/2019   Right hip pain 08/06/2019   Nodule of neck 08/06/2019   Moderate episode of recurrent major depressive disorder (HCC) 10/06/2019   Candidiasis of skin 10/06/2019   Parotid mass 10/15/2019   Postoperative state 04/12/2020   Other female genital prolapse    Pelvic and perineal pain    Stress incontinence    URI (upper respiratory infection) 05/21/2020   Intertrigo 04/27/2021   Nausea 11/25/2021   Jerking 05/17/2023   Past Medical History:  Diagnosis Date   Allergy    Anxiety    Cervical cancer (HCC) 2011   Chronic headaches    Depression    Edema 10/01/2023   Fatty liver    GERD (gastroesophageal reflux disease)    Constitutional Exam  General appearance: Well nourished, well developed, and well hydrated. In no apparent acute distress Vitals:   05/05/24 1414  Pulse: 88  Resp: 16  Temp: 98.6 F (37 C)  TempSrc: Temporal  SpO2: 98%  Weight: 189 lb (85.7 kg)  Height: 5' 9 (1.753 m)   BMI Assessment: Estimated body mass index is 27.91 kg/m as calculated from the following:   Height as of this encounter: 5' 9 (1.753 m).   Weight as of this encounter: 189 lb (85.7 kg).  BMI interpretation table: BMI level Category Range association with higher incidence of chronic pain  <18  kg/m2 Underweight   18.5-24.9 kg/m2 Ideal body weight   25-29.9 kg/m2 Overweight Increased incidence by 20%  30-34.9 kg/m2 Obese (Class I) Increased incidence by 68%  35-39.9 kg/m2 Severe obesity (Class II) Increased incidence by 136%  >40 kg/m2 Extreme obesity (Class III) Increased incidence by 254%   Patient's current BMI Ideal Body weight  Body mass index is 27.91 kg/m. Ideal body weight: 66.2 kg (145 lb 15.1 oz) Adjusted ideal body weight: 74 kg (163 lb 2.7 oz)   BMI Readings from Last 4 Encounters:  05/05/24 27.91 kg/m  03/18/24 30.93 kg/m  03/13/24 29.24 kg/m  03/12/24 29.39 kg/m   Wt Readings from Last 4 Encounters:  05/05/24 189 lb (85.7 kg)  03/18/24 203 lb 6.4 oz (92.3 kg)  03/13/24 198 lb (89.8 kg)  03/12/24 199 lb (90.3 kg)    Psych/Mental status: Alert, oriented x 3 (person, place, & time)       Eyes: PERLA Respiratory: No evidence of acute respiratory distress  Thoracic Spine Area Exam  Skin & Axial Inspection: No masses, redness, or swelling Alignment: Symmetrical Functional ROM: Unrestricted ROM Stability: No instability detected Muscle Tone/Strength: Functionally intact. No obvious neuro-muscular anomalies detected. Sensory (Neurological): Unimpaired Muscle strength & Tone: No palpable anomalies Lumbar Spine Area Exam  Skin & Axial Inspection: No masses, redness, or swelling Alignment: Symmetrical Functional ROM: Pain restricted ROM affecting both sides Stability: No instability detected Muscle Tone/Strength: Functionally intact. No obvious neuro-muscular  anomalies detected. Sensory (Neurological): Musculoskeletal pain pattern  Gait & Posture Assessment  Ambulation: Patient ambulates using a walker Gait: Antalgic Posture: Difficulty standing up straight, due to pain  Lower Extremity Exam    Side: Right lower extremity  Side: Left lower extremity  Stability: No instability observed          Stability: No instability observed          Skin &  Extremity Inspection: Skin color, temperature, and hair growth are WNL. No peripheral edema or cyanosis. No masses, redness, swelling, asymmetry, or associated skin lesions. No contractures.  Skin & Extremity Inspection: Skin color, temperature, and hair growth are WNL. No peripheral edema or cyanosis. No masses, redness, swelling, asymmetry, or associated skin lesions. No contractures.  Functional ROM: Unrestricted ROM                  Functional ROM: Pain restricted ROM for hip and knee joints          Muscle Tone/Strength: Functionally intact. No obvious neuro-muscular anomalies detected.  Muscle Tone/Strength: Functionally intact. No obvious neuro-muscular anomalies detected.  Sensory (Neurological): Unimpaired        Sensory (Neurological): Neurogenic pain pattern        DTR: Patellar: deferred today Achilles: deferred today Plantar: deferred today  DTR: Patellar: deferred today Achilles: deferred today Plantar: deferred today  Palpation: No palpable anomalies  Palpation: No palpable anomalies    Assessment  Primary Diagnosis & Pertinent Problem List: The primary encounter diagnosis was Chronic radicular lumbar pain. Diagnoses of Lumbar radiculopathy, Lumbar facet arthropathy, Degeneration of intervertebral disc of lumbar region with discogenic back pain and lower extremity pain, and Chronic pain syndrome were also pertinent to this visit.  Visit Diagnosis (New problems to examiner): 1. Chronic radicular lumbar pain   2. Lumbar radiculopathy   3. Lumbar facet arthropathy   4. Degeneration of intervertebral disc of lumbar region with discogenic back pain and lower extremity pain   5. Chronic pain syndrome    Plan of Care (Initial workup plan)   The patient presents with chronic, intractable pain involving both mechanical low back pain and neurogenic components radiating into the lower extremities. Given the patients history  and limited response to conservative and interventional  therapies, a spinal cord stimulator (SCS) trial is being considered. While SCS is typically more effective for neuropathic and appendicular pain, we discussed that some patients may also experience relief in axial low back and hip pain. The potential benefits and risks of spinal cord stimulation were thoroughly reviewed.  The proposed plan includes a percutaneous spinal cord stimulator trial. The patient was informed that this will involve temporary placement of epidural leads connected to an external pulse generator, which will be used over a 7-day trial period. We discussed the possibility of a mid-trial in-office visit to adjust settings and optimize programming in order to give the patient the best chance of success. The patient will receive daily support from the device representative throughout the trial.  We reviewed the benefits of SCS, which include potential substantial pain relief, reduction in the use of oral pain medications including opioids, and long-term programmable therapy that can reduce reliance on repeated injections and other pain interventions. Risks were also discussed in detail and include potential surgical complications such as infection, bleeding, CSF leak, lead migration or fracture, hardware malfunction, and the possibility of either no pain relief or worsening of symptoms. The patient was advised that spinal cord stimulation is not a guaranteed  solution or a magic bullet, but rather a potentially valuable therapy in appropriately selected cases.  As part of standard protocol, the patient will require a comprehensive psychosocial and behavioral evaluation prior to the trial. A referral was placed to [facility], and the patient was instructed to have the results faxed to our clinic prior to scheduling the procedure. We had a thorough and detailed discussion reviewing the rationale, alternatives, risks, and expected outcomes. The patient stated that all questions were answered to  their satisfaction, demonstrated appropriate understanding, and expressed readiness to proceed. There were no barriers to understanding the treatment plan, and the explanation was well received. The patient is eager to move forward with the spinal cord stimulator trial once the necessary evaluation is complete.    Referral Orders         Ambulatory referral to Psychology    Procedure Orders         [place] TRIAL     Provider-requested follow-up: Return in about 4 weeks (around 06/02/2024) for [employer] SCS trial, ECT.  Future Appointments  Date Time Provider Department Center  05/08/2024 10:20 AM Corwin Antu, FNP [id number] [id number]  06/05/2024 10:00 AM Jeannetta Lonni ORN, MD [facility] None  06/13/2024  1:00 PM [facility] None  06/13/2024  2:00 PM Elisabeth Round R, NP [facility] None  06/24/2024  1:15 PM Buck Saucer, MD [facility] None  07/10/2024 10:15 AM Regal, Prentice PARAS, DPM [id number]   I discussed the assessment and treatment plan with the patient. The patient was provided an opportunity to ask questions and all were answered. The patient agreed with the plan and demonstrated an understanding of the instructions.  Patient advised to call back or seek an in-person evaluation if the symptoms or condition worsens.  I personally spent a total of 60 minutes in the care of the patient today including preparing to see the patient, getting/reviewing separately obtained history, performing a medically appropriate exam/evaluation, counseling and educating, placing orders, and documenting clinical information in the EHR.   Note by: Wallie Sherry, MD (TTS and AI technology used. I apologize for any typographical errors that were not detected and corrected.) Date: 05/05/2024; Time: 3:10 PM     [1]  Current Outpatient Medications:    amitriptyline  (ELAVIL ) 25 MG tablet, TAKE 1 TABLET BY MOUTH EVERYDAY AT BEDTIME, Disp: 90 tablet, Rfl: 3   baclofen  (LIORESAL ) 10 MG tablet, TAKE 1 TABLET BY MOUTH  TWICE A DAY AS NEEDED FOR MUSCLE SPASMS, Disp: 180 tablet, Rfl: 1   busPIRone  (BUSPAR ) 15 MG tablet, TAKE 1 TABLET BY MOUTH 3 TIMES DAILY., Disp: 90 tablet, Rfl: 5   cetirizine  (ZYRTEC ) 10 MG tablet, Take 1 tablet (10 mg total) by mouth daily., Disp: 90 tablet, Rfl: 3   clotrimazole  (CLOTRIMAZOLE  ANTI-FUNGAL) 1 % cream, Apply 1 application twice daily for two weeks, Disp: 30 g, Rfl: 0   clotrimazole -betamethasone  (LOTRISONE ) cream, Apply 1 Application topically daily., Disp: , Rfl:    FLUoxetine  HCl 60 MG TABS, TAKE 60 MG BY MOUTH DAILY., Disp: 90 tablet, Rfl: 3   fluticasone  (FLOVENT  HFA) 110 MCG/ACT inhaler, Inhale 1 puff into the lungs in the morning and at bedtime., Disp: 1 each, Rfl: 12   furosemide  (LASIX ) 20 MG tablet, TAKE 1 TABLET BY MOUTH EVERY DAY, Disp: 90 tablet, Rfl: 1   gabapentin  (NEURONTIN ) 400 MG capsule, Take 400 mg by mouth 3 (three) times daily., Disp: , Rfl:    Hydrocortisone  Acetate 1 % CREA, Apply 1 application  topically daily as needed (heat rash)., Disp: 15 g, Rfl: 0   hydrOXYzine  (ATARAX ) 25 MG tablet, TAKE 1 TABLET BY MOUTH EVERYDAY AT BEDTIME, Disp: 30 tablet, Rfl: 5   mupirocin  ointment (BACTROBAN ) 2 %, APPLY TO AFFECTED AREA TOPICALLY EVERY DAY, Disp: 22 g, Rfl: 0   nystatin  (MYCOSTATIN /NYSTOP ) powder, APPLY TO AFFECTED AREA 3 TIMES A DAY, Disp: 15 g, Rfl: 5   omeprazole  (PRILOSEC) 20 MG capsule, TAKE 1 CAPSULE BY MOUTH EVERY DAY, Disp: 90 capsule, Rfl: 1   ondansetron  (ZOFRAN ) 4 MG tablet, TAKE 1 TABLET BY MOUTH EVERY 8 HOURS AS NEEDED FOR NAUSEA AND VOMITING, Disp: 20 tablet, Rfl: 0   potassium chloride  (KLOR-CON  M) 10 MEQ tablet, Take 1 tablet (10 mEq total) by mouth 2 (two) times daily for 5 days., Disp: 10 tablet, Rfl: 0   pravastatin (PRAVACHOL) 10 MG tablet, Take 10 mg by mouth at bedtime., Disp: , Rfl:    promethazine  (PHENERGAN ) 25 MG tablet, Take 25 mg by mouth every 6 (six) hours as needed for nausea or vomiting., Disp: , Rfl:    valACYclovir  (VALTREX ) 500  MG tablet, TAKE 2 TABLETS BY MOUTH EVERY DAY, Disp: 60 tablet, Rfl: 2   VENTOLIN  HFA 108 (90 Base) MCG/ACT inhaler, TAKE 2 PUFFS BY MOUTH EVERY 6 HOURS AS NEEDED FOR WHEEZE OR SHORTNESS OF BREATH, Disp: 18 each, Rfl: 2   SUBOXONE 8-2 MG FILM, as needed. (Patient not taking: Reported on 05/05/2024), Disp: , Rfl:

## 2024-05-07 ENCOUNTER — Encounter: Payer: Self-pay | Admitting: Family

## 2024-05-07 NOTE — Telephone Encounter (Signed)
 Spoke with pt due to the nature of her message. Reports that she is having chest pain and it's going down her left arm. This pain has been present for several hours and has progressively gotten worse. Advised pt that she needs to call 911 to be transported to the hospital to be evaluated. Pt verbalized understanding. Also advised pt that in the future these types of messages do not need to be sent through MyChart, she should contact the office to speak to a nurse.

## 2024-05-08 ENCOUNTER — Ambulatory Visit: Admitting: Family

## 2024-05-08 NOTE — Telephone Encounter (Addendum)
 When speaking to the pt yesterday, she verbalized understanding when I advised her to call 911. I did not call 911 for her due to her stating that she would do it herself. LM for pt to return my call.

## 2024-05-08 NOTE — Telephone Encounter (Signed)
 Attempted to contact pt again, call went straight to VM. LM for pt to return my call.

## 2024-05-08 NOTE — Telephone Encounter (Signed)
 Did she confirm she going to hang up and call 911?  Or did you call 911 for her?   If not could we please give pt a call back or call triage to call back asap

## 2024-05-09 NOTE — Telephone Encounter (Signed)
 LM for pt to return my call

## 2024-05-14 ENCOUNTER — Telehealth: Payer: Self-pay | Admitting: *Deleted

## 2024-05-14 NOTE — Telephone Encounter (Signed)
 Discussed SCS trial with patient. Appointment 05/28/24 at 0845. Will need to have a driver with her. Nothing to eat after midnight. Appointment will last 2-3 hours. Wash back with Hibiclens  the night before and morning of. Verbalizes understanding.

## 2024-05-15 ENCOUNTER — Ambulatory Visit: Admitting: Family

## 2024-05-15 ENCOUNTER — Ambulatory Visit: Admitting: Student in an Organized Health Care Education/Training Program

## 2024-05-19 NOTE — Telephone Encounter (Signed)
 Per chart review looks like insurance would not cover injections. Please advise.

## 2024-05-20 ENCOUNTER — Ambulatory Visit: Admitting: Family

## 2024-05-26 NOTE — Telephone Encounter (Signed)
 Once weather clears pt needs appt to discuss In person bc of weight otherwise I would  We can do it post op  Also tell her to check with medicaid I believe they are approving weight loss injections again

## 2024-05-27 ENCOUNTER — Encounter: Payer: Self-pay | Admitting: Family

## 2024-05-27 ENCOUNTER — Ambulatory Visit: Admitting: Family

## 2024-05-27 ENCOUNTER — Telehealth: Payer: Self-pay | Admitting: Family

## 2024-05-27 DIAGNOSIS — E782 Mixed hyperlipidemia: Secondary | ICD-10-CM

## 2024-05-27 DIAGNOSIS — E66811 Obesity, class 1: Secondary | ICD-10-CM

## 2024-05-27 DIAGNOSIS — J3089 Other allergic rhinitis: Secondary | ICD-10-CM | POA: Diagnosis not present

## 2024-05-27 DIAGNOSIS — G4733 Obstructive sleep apnea (adult) (pediatric): Secondary | ICD-10-CM

## 2024-05-27 DIAGNOSIS — R739 Hyperglycemia, unspecified: Secondary | ICD-10-CM

## 2024-05-27 DIAGNOSIS — H9201 Otalgia, right ear: Secondary | ICD-10-CM

## 2024-05-27 DIAGNOSIS — I10 Essential (primary) hypertension: Secondary | ICD-10-CM | POA: Diagnosis not present

## 2024-05-27 DIAGNOSIS — N898 Other specified noninflammatory disorders of vagina: Secondary | ICD-10-CM | POA: Diagnosis not present

## 2024-05-27 MED ORDER — PREDNISONE 10 MG (21) PO TBPK: ORAL_TABLET | ORAL | 0 refills | Status: AC

## 2024-05-27 MED ORDER — METRONIDAZOLE 500 MG PO TABS: 500.0000 mg | ORAL_TABLET | Freq: Two times a day (BID) | ORAL | 0 refills | Status: AC

## 2024-05-27 MED ORDER — FLUTICASONE PROPIONATE 50 MCG/ACT NA SUSP: 2.0000 | Freq: Every day | NASAL | 6 refills | Status: AC

## 2024-05-27 MED ORDER — WEGOVY 0.25 MG/0.5ML ~~LOC~~ SOAJ: 0.2500 mg | SUBCUTANEOUS | 0 refills | Status: AC

## 2024-05-27 MED ORDER — LEVOCETIRIZINE DIHYDROCHLORIDE 5 MG PO TABS: 5.0000 mg | ORAL_TABLET | Freq: Every evening | ORAL | 0 refills | Status: AC

## 2024-05-27 NOTE — Patient Instructions (Signed)
" °  Bacterial vaginosis (BV) can cause discomfort and pain of the vagina. It happens when natural bacteria levels are out of balance. Balanced levels of bacteria help keep the vagina healthy. But when too much of some bacteria grow, it can lead to BV.  Bacterial vaginosis can happen at any age. But it's most common during the reproductive years. The changes in hormones during this time make it easier for certain kinds of bacteria to grow. Also, bacterial vaginosis is more common among those who are sexually active. It's not clear why this is. But activities such as unprotected sex and douching raise your risk of having BV  Causes:   Bacterial vaginosis happens when the vagina's natural bacteria levels are out of balance. The bacteria in the vagina are called the vaginal flora. Balanced vaginal flora help keep the vagina healthy. Usually good bacteria outnumber bad bacteria. The good bacteria are called lactobacilli; the bad bacteria are anaerobes. When there are too many anaerobes, they upset the balance of the flora, causing bacterial vaginosis.  Risk factors for bacterial vaginosis include:  Having different sex partners or a new sex partner. The link between having sex and bacterial vaginosis isn't clear. But BV happens more often when someone has different or new sex partners. Also, BV is more common when the sex of both partners is female. Douching. The vagina is self-cleaning. So rinsing your vagina with water or something else isn't needed. It may even cause problems. Douching upsets the vagina's healthy balance of bacteria. It can lead to an overgrowth of anaerobic bacteria, causing bacterial vaginosis. Natural lack of lactobacilli bacteria. If your vagina doesn't produce enough lactobacilli, you're more likely to develop bacterial vaginosis.  To help prevent bacterial vaginosis:  Don't use scented products. Wash your genitals with warm water only. Scented soaps and other scented products  may inflame vaginal tissues. Use unscented tampons or pads only. Don't douche. Douching won't clear up a vaginal infection. It may even make it worse. Your vagina doesn't require cleansing other than normal bathing. Douching disrupts the vaginal flora, raising your risk of infection. Practice safe sex. To lower your risk of STIs, use latex condoms or dental dams. Clean any sex toys. Limit your number of sex partners or don't have sex.  "

## 2024-05-27 NOTE — Telephone Encounter (Signed)
 Can we start a prior auth for [email or web address]   She has been on this before only stopped due to insurance not covering.

## 2024-05-27 NOTE — Progress Notes (Signed)
 "  Virtual Visit via Video note  I connected with Erin Dorsey on 05/27/24 at home by video and verified that I am speaking with the correct person using two identifiers.The provider, Ginger Patrick, FNP is located in their home at time of visit.  I discussed the limitations, risks, security and privacy concerns of performing an evaluation and management service by video and the availability of in person appointments. I also discussed with the patient that there may be a patient responsible charge related to this service. The patient expressed understanding and agreed to proceed.  Subjective: PCP: Patrick Ginger, FNP  Chief Complaint  Patient presents with   Acute Visit    Ear pain    HPI  Discussed the use of AI scribe software for clinical note transcription with the patient, who gave verbal consent to proceed.  History of Present Illness Erin Dorsey is a 54 year old female who presents with persistent right ear pain and new onset of vaginal discharge.  She has been experiencing persistent right ear pain since at least November. Initially, she tried over-the-counter eardrops with lidocaine , which provided minimal relief. In October, she was prescribed Bactrim, which she completed, followed by an antibiotic regimen of one pill twice daily for seven days, offering some relief. In December, she received a refill of ear drops, likely ofloxacin or cortisporin, which helped slightly. Despite these treatments, she continues to experience ear pain, swelling on the right side of her face, itching in the ear canal, and a sensation of fullness in the ear, particularly after washing her hair. No fever is present, and her hearing is slightly impaired in the affected ear. She is allergic to amoxicillin, which causes a rash, and has previously taken cefdinir  and prednisone  without significant issues.  She reports a new onset of a persistent ammonia-like smell, which began a couple of months  ago, accompanied by yellow vaginal discharge and itching. No urinary symptoms such as burning, increased frequency, or urgency. She has a history of cervical cancer and has undergone a hysterectomy. She has not been sexually active for a long time and does not suspect a current sexually transmitted infection. She has been wearing pads and notes the discharge is yellow but is unsure of its consistency.  Her current medications include Claritin for allergies, which she believes she is taking over-the-counter. She denies using Flonase  or any nasal sprays. She has previously taken cetirizine  and has used prednisone  in the past with good tolerance. She is not currently taking Suboxone or potassium supplements. She does not consume alcohol.  [place].  Current weight 187 pounds.         ROS: Per HPI Current Medications[1]  Observations/Objective: Physical Exam Constitutional:      General: She is not in acute distress.    Appearance: Normal appearance. She is not ill-appearing.  Pulmonary:     Effort: Pulmonary effort is normal.  Neurological:     General: No focal deficit present.     Mental Status: She is alert and oriented to person, place, and time.  Psychiatric:        Mood and Affect: Mood normal.        Behavior: Behavior normal.        Thought Content: Thought content normal.       Assessment and Plan Assessment & Plan Chronic right otitis externa Persistent right ear pain and itching for several months, exacerbated by showering.  No fever or significant hearing loss. Previous treatments with antibiotics and ear drops provided partial relief. Differential includes allergic rhinitis causing fluid buildup rather than infection. - Switched allergy medication to levetiracetam (Xyzal ). - Started nasal spray  floansefor allergy management. - Prescribed prednisone  for inflammation. -stop zyrtec  start xyzal   - Will refer  to ENT if symptoms do not improve.  Allergic rhinitis Chronic runny nose and post-nasal drip, possibly contributing to ear symptoms. Current allergy management with cetirizine  is insufficient. - Switched allergy medication to levetiracetam (Xyzal ). - Started nasal spray for allergy management.  Acute vaginitis Yellow vaginal discharge with itching, no urinary symptoms. Differential includes bacterial vaginitis. - Prescribed metronidazole  (Flagyl ) for suspected bacterial vaginitis. - Advised against douching and use of scented products in the genital area. - Instructed to schedule nurse visit for swabs if symptoms persist. -wet prep ordered pt to schedule nurse only visit / u/a also ordered   Obesity, HLD, moderate sleep apnea, HTN and prediabetes  Current weight is 187 pounds. Previous use of Wegovy  was interrupted due to insurance issues. Engaging in exercise and dietary modifications. - Submitted prior authorization for Wegovy . - Advised to schedule a three-month follow-up if Wegovy  is approved. -The beneficiary does not have any FDA labeled contraindications to the requested agent including pregnancy, lactation, h/o medullary thyroid  cancer or multiple endocrine neoplasia type II. She does also have a diagnosis of moderate sleep apnea        Follow Up Instructions: Return in about 3 months (around 08/25/2024) for f/u weight loss medication.   I discussed the assessment and treatment plan with the patient. The patient was provided an opportunity to ask questions and all were answered. The patient agreed with the plan and demonstrated an understanding of the instructions.   The patient was advised to call back or seek an in-person evaluation if the symptoms worsen or if the condition fails to improve as anticipated.  The above assessment and management plan was discussed with the patient. The patient verbalized understanding of and has agreed to the management plan. Patient is aware  to call the clinic if symptoms persist or worsen. Patient is aware when to return to the clinic for a follow-up visit. Patient educated on when it is appropriate to go to the emergency department.     Ginger Patrick, MSN, APRN, FNP-C [facility]    [1]  Current Outpatient Medications:    amitriptyline  (ELAVIL ) 25 MG tablet, TAKE 1 TABLET BY MOUTH EVERYDAY AT BEDTIME, Disp: 90 tablet, Rfl: 3   baclofen  (LIORESAL ) 10 MG tablet, TAKE 1 TABLET BY MOUTH TWICE A DAY AS NEEDED FOR MUSCLE SPASMS, Disp: 180 tablet, Rfl: 1   busPIRone  (BUSPAR ) 15 MG tablet, TAKE 1 TABLET BY MOUTH 3 TIMES DAILY., Disp: 90 tablet, Rfl: 5   clotrimazole  (CLOTRIMAZOLE  ANTI-FUNGAL) 1 % cream, Apply 1 application twice daily for two weeks, Disp: 30 g, Rfl: 0   clotrimazole -betamethasone  (LOTRISONE ) cream, Apply 1 Application topically daily., Disp: , Rfl:    FLUoxetine  HCl 60 MG TABS, TAKE 60 MG BY MOUTH DAILY., Disp: 90 tablet, Rfl: 3   fluticasone  (FLONASE ) 50 MCG/ACT nasal spray, Place 2 sprays into both nostrils daily., Disp: 16 g, Rfl: 6   fluticasone  (FLOVENT  HFA) 110 MCG/ACT inhaler, Inhale 1 puff into the lungs in the morning and at bedtime., Disp: 1 each, Rfl: 12   furosemide  (LASIX ) 20 MG tablet, TAKE 1 TABLET BY MOUTH EVERY DAY, Disp: 90 tablet, Rfl: 1   gabapentin  (  NEURONTIN ) 400 MG capsule, Take 400 mg by mouth 3 (three) times daily., Disp: , Rfl:    Hydrocortisone  Acetate 1 % CREA, Apply 1 application  topically daily as needed (heat rash)., Disp: 15 g, Rfl: 0   hydrOXYzine  (ATARAX ) 25 MG tablet, TAKE 1 TABLET BY MOUTH EVERYDAY AT BEDTIME, Disp: 30 tablet, Rfl: 5   levocetirizine (XYZAL ) 5 MG tablet, Take 1 tablet (5 mg total) by mouth every evening., Disp: 90 tablet, Rfl: 0   metroNIDAZOLE  (FLAGYL ) 500 MG tablet, Take 1 tablet (500 mg total) by mouth 2 (two) times daily for 7 days., Disp: 14 tablet, Rfl: 0   mupirocin  ointment (BACTROBAN ) 2 %, APPLY TO AFFECTED AREA TOPICALLY EVERY DAY,  Disp: 22 g, Rfl: 0   nystatin  (MYCOSTATIN /NYSTOP ) powder, APPLY TO AFFECTED AREA 3 TIMES A DAY, Disp: 15 g, Rfl: 5   omeprazole  (PRILOSEC) 20 MG capsule, TAKE 1 CAPSULE BY MOUTH EVERY DAY, Disp: 90 capsule, Rfl: 1   ondansetron  (ZOFRAN ) 4 MG tablet, TAKE 1 TABLET BY MOUTH EVERY 8 HOURS AS NEEDED FOR NAUSEA AND VOMITING, Disp: 20 tablet, Rfl: 0   pravastatin (PRAVACHOL) 10 MG tablet, Take 10 mg by mouth at bedtime., Disp: , Rfl:    predniSONE  (STERAPRED UNI-PAK 21 TAB) 10 MG (21) TBPK tablet, Take as directed, Disp: 1 each, Rfl: 0   promethazine  (PHENERGAN ) 25 MG tablet, Take 25 mg by mouth every 6 (six) hours as needed for nausea or vomiting., Disp: , Rfl:    semaglutide -weight management (WEGOVY ) 0.25 MG/0.5ML SOAJ SQ injection, Inject 0.25 mg into the skin once a week., Disp: 2 mL, Rfl: 0   valACYclovir  (VALTREX ) 500 MG tablet, TAKE 2 TABLETS BY MOUTH EVERY DAY, Disp: 60 tablet, Rfl: 2   VENTOLIN  HFA 108 (90 Base) MCG/ACT inhaler, TAKE 2 PUFFS BY MOUTH EVERY 6 HOURS AS NEEDED FOR WHEEZE OR SHORTNESS OF BREATH, Disp: 18 each, Rfl: 2  "

## 2024-05-28 ENCOUNTER — Ambulatory Visit: Admitting: Student in an Organized Health Care Education/Training Program

## 2024-05-28 ENCOUNTER — Other Ambulatory Visit: Payer: Self-pay | Admitting: Student in an Organized Health Care Education/Training Program

## 2024-05-28 ENCOUNTER — Ambulatory Visit
Admission: RE | Admit: 2024-05-28 | Discharge: 2024-05-28 | Disposition: A | Discharge status: Home / Self Care | Source: Ambulatory Visit | Attending: Student in an Organized Health Care Education/Training Program | Admitting: Student in an Organized Health Care Education/Training Program

## 2024-05-28 DIAGNOSIS — G894 Chronic pain syndrome: Secondary | ICD-10-CM | POA: Insufficient documentation

## 2024-05-28 DIAGNOSIS — M51362 Other intervertebral disc degeneration, lumbar region with discogenic back pain and lower extremity pain: Secondary | ICD-10-CM | POA: Diagnosis not present

## 2024-05-28 DIAGNOSIS — M47816 Spondylosis without myelopathy or radiculopathy, lumbar region: Secondary | ICD-10-CM | POA: Insufficient documentation

## 2024-05-28 DIAGNOSIS — M5416 Radiculopathy, lumbar region: Secondary | ICD-10-CM

## 2024-05-28 DIAGNOSIS — G8929 Other chronic pain: Secondary | ICD-10-CM | POA: Insufficient documentation

## 2024-05-28 MED ORDER — MIDAZOLAM HCL 5 MG/5ML IJ SOLN
0.5000 mg | Freq: Once | INTRAMUSCULAR | Status: AC
  Administered 2024-05-28: 2 mg via INTRAVENOUS

## 2024-05-28 MED ORDER — FENTANYL CITRATE (PF) 100 MCG/2ML IJ SOLN
25.0000 ug | INTRAMUSCULAR | Status: AC | PRN
  Administered 2024-05-28: 50 ug via INTRAVENOUS
  Administered 2024-05-28: 25 ug via INTRAVENOUS

## 2024-05-28 MED ORDER — LACTATED RINGERS IV SOLN: Freq: Once | INTRAVENOUS | Status: AC

## 2024-05-28 MED ORDER — FENTANYL CITRATE (PF) 100 MCG/2ML IJ SOLN
INTRAMUSCULAR | Status: AC
  Filled 2024-05-28: qty 2

## 2024-05-28 MED ORDER — CEPHALEXIN 500 MG PO CAPS: 500.0000 mg | ORAL_CAPSULE | Freq: Four times a day (QID) | ORAL | 0 refills | Status: AC

## 2024-05-28 MED ORDER — MIDAZOLAM HCL 5 MG/5ML IJ SOLN
INTRAMUSCULAR | Status: AC
  Filled 2024-05-28: qty 5

## 2024-05-28 MED ORDER — CEFAZOLIN SODIUM-DEXTROSE 2-4 GM/100ML-% IV SOLN
2.0000 g | Freq: Once | INTRAVENOUS | Status: AC
  Administered 2024-05-28: 2 g via INTRAVENOUS
  Filled 2024-05-28: qty 100

## 2024-05-28 MED ORDER — LIDOCAINE HCL 2 % IJ SOLN
20.0000 mL | Freq: Once | INTRAMUSCULAR | Status: AC
  Administered 2024-05-28: 400 mg

## 2024-05-28 MED ORDER — CEFAZOLIN SODIUM 1 G IJ SOLR
INTRAMUSCULAR | Status: AC
  Filled 2024-05-28: qty 20

## 2024-05-28 MED ORDER — LIDOCAINE HCL 2 % IJ SOLN
INTRAMUSCULAR | Status: AC
  Filled 2024-05-28: qty 20

## 2024-05-28 NOTE — Patient Instructions (Addendum)
 Today we did the following -We have done a Spinal Cord Stimulator Trial with [employer]  -As long as the leads are in place, do not bathe or shower. You may sponge bathe.  -While the lead is in place, please limit the bending, lifting, or twisting because the lead can move.  -The things we want to see is if your pain improves (and by what percentage), if you can do more activity (don't overdo it), and if you can use less of your as needed medicine. Do not stop long acting medicines like methadone, oxycontin , MS Contin , etc without checking with us .  -It is VERY important that you pick up the antibiotics we prescribed, Keflex , on your way home from the trial and take them as prescribed(4 times a day), starting today, for as long as the lead is in place.  -The Spina Cord Stimulator Representative will be in contact with you while the lead is in place to make sure the trial goes as well as possible.  -Please contact us  with any questions or concerns at any time during the trial.   -If you start running a fever over 100 degrees, have severe back pain, or new pain running down the legs, or drainage coming from the lead site, contact us  immediately and/or go to the emergency room.  -Please do not restart any sort of medication that can thin your blood such as Aspirin, ibuprofen , motrin , aleve, plavix, coumadin, etc. If you aren't sure, call and ask.  -We will have you return in 7 Days to have the lead removed. If this is successful, at that point we can go over the details about the permanent implant.   ______________________________________________________________________    Post-Procedure Discharge Instructions  INSTRUCTIONS Apply ice:  Purpose: This will minimize any swelling and discomfort after procedure.  When: Day of procedure, as soon as you get home. How: Fill a plastic sandwich bag with crushed ice. Cover it with a small towel and apply to injection site. How long: (15 min on, 15  min off) Apply for 15 minutes then remove x 15 minutes.  Repeat sequence on day of procedure, until you go to bed. Apply heat:  Purpose: To treat any soreness and discomfort from the procedure. When: Starting the next day after the procedure. How: Apply heat to procedure site starting the day following the procedure. How long: May continue to repeat daily, until discomfort goes away. Food intake: Start with clear liquids (like water) and advance to regular food, as tolerated.  Physical activities: Keep activities to a minimum for the first 8 hours after the procedure. After that, then as tolerated. Driving: If you have received any sedation, be responsible and do not drive. You are not allowed to drive for 24 hours after having sedation. Blood thinner: (Applies only to those taking blood thinners) You may restart your blood thinner 6 hours after your procedure. Insulin: (Applies only to Diabetic patients taking insulin) As soon as you can eat, you may resume your normal dosing schedule. Infection prevention: Keep procedure site clean and dry. Shower daily and clean area with soap and water.  PAIN DIARY Post-procedure Pain Diary: Extremely important that this be done correctly and accurately. Recorded information will be used to determine the next step in treatment. For the purpose of accuracy, follow these rules: Evaluate only the area treated. Do not report or include pain from an untreated area. For the purpose of this evaluation, ignore all other areas of pain, except for the treated  area. After your procedure, avoid taking a long nap and attempting to complete the pain diary after you wake up. Instead, set your alarm clock to go off every hour, on the hour, for the initial 8 hours after the procedure. Document the duration of the numbing medicine, and the relief you are getting from it. Do not go to sleep and attempt to complete it later. It will not be accurate. If you received sedation, it is  likely that you were given a medication that may cause amnesia. Because of this, completing the diary at a later time may cause the information to be inaccurate. This information is needed to plan your care. Follow-up appointment: Keep your post-procedure follow-up evaluation appointment after the procedure (usually 2 weeks for most procedures, 6 weeks for radiofrequencies). DO NOT FORGET to bring you pain diary with you.   EXPECT... (What should I expect to see with my procedure?) From numbing medicine (AKA: Local Anesthetics): Numbness or decrease in pain. You may also experience some weakness, which if present, could last for the duration of the local anesthetic. Onset: Full effect within 15 minutes of injected. Duration: It will depend on the type of local anesthetic used. On the average, 1 to 8 hours.  From steroids (Applies only if steroids were used): Decrease in swelling or inflammation. Once inflammation is improved, relief of the pain will follow. Onset of benefits: Depends on the amount of swelling present. The more swelling, the longer it will take for the benefits to be seen. In some cases, up to 10 days. Duration: Steroids will stay in the system x 2 weeks. Duration of benefits will depend on multiple posibilities including persistent irritating factors. Side-effects: If present, they may typically last 2 weeks (the duration of the steroids). Frequent: Cramps (if they occur, drink Gatorade and take over-the-counter Magnesium 450-500 mg once to twice a day); water retention with temporary weight gain; increases in blood sugar; decreased immune system response; increased appetite. Occasional: Facial flushing (red, warm cheeks); mood swings; menstrual changes. Uncommon: Long-term decrease or suppression of natural hormones; bone thinning. (These are more common with higher doses or more frequent use. This is why we prefer that our patients avoid having any injection therapies in other  practices.)  Very Rare: Severe mood changes; psychosis; aseptic necrosis. From procedure: Some discomfort is to be expected once the numbing medicine wears off. This should be minimal if ice and heat are applied as instructed.  CALL IF... (When should I call?) You experience numbness and weakness that gets worse with time, as opposed to wearing off. New onset bowel or bladder incontinence. (Applies only to procedures done in the spine)  Emergency Numbers: Durning business hours (Monday - Thursday, 8:00 AM - 4:00 PM) (Friday, 9:00 AM - 12:00 Noon): [phone] After hours: [phone] NOTE: If you are having a problem and are unable connect with, or to talk to a provider, then go to your nearest urgent care or emergency department. If the problem is serious and urgent, please call 911. ______________________________________________________________________

## 2024-05-28 NOTE — Progress Notes (Signed)
 Safety precautions to be maintained throughout the outpatient stay will include: orient to surroundings, keep bed in low position, maintain call bell within reach at all times, provide assistance with transfer out of bed and ambulation.   Began taking Prednisone  dosepack yesterday for ear pain.  No antibiotics, no fever.

## 2024-05-28 NOTE — Progress Notes (Signed)
 PROVIDER NOTE: Interpretation of information contained herein should be left to medically-trained personnel. Specific patient instructions are provided elsewhere under Patient Instructions section of medical record. This document was created in part using STT-dictation technology, any transcriptional errors that may result from this process are unintentional.  Patient: Erin Dorsey Type: Established DOB: [birth year] MRN: [id number] PCP: Corwin Antu, FNP  Service: Procedure DOS: 05/28/2024 Setting: Ambulatory Location: Ambulatory outpatient facility Delivery: Face-to-face Provider: Wallie Sherry, MD Specialty: Interventional Pain Management Specialty designation: 09 Location: Outpatient facility Ref. Prov.: Sherry Wallie, MD       Interventional Therapy   Primary Reason for Admission: Surgical management of chronic pain condition.   Procedure:              Type: MEDTRONIC Trial Spinal Cord Neurostimulator Implant (Percutaneous, interlaminar, posterior epidural placement) Laterality: Bilateral (-50)  Level: Lumbar  Imaging: Fluoroscopic guidance Anesthesia: Local anesthesia (1-2% Lidocaine ) Analgesia: Moderate Sedation                       DOS: 05/28/2024  Performed by: Wallie Sherry, MD  Purpose: Diagnostic. To determine if a permanent implant may be effective in controlling some or all of Ms. King's chronic pain symptoms.  Rationale (medical necessity): procedure needed and proper for the diagnosis and/or treatment of Ms. Nary's medical symptoms and needs. 1. Chronic radicular lumbar pain   2. Lumbar radiculopathy   3. Lumbar facet arthropathy   4. Degeneration of intervertebral disc of lumbar region with discogenic back pain and lower extremity pain   5. Chronic pain syndrome    NAS-11 Pain score:   Pre-procedure: 10-Worst pain ever/10   Post-procedure: 0-No pain/10     Target: Posterior epidural space over the dorsal columns of the spinal cord. Location: Posterior  intraspinal canal Region: Thoracolumbar  Approach: Translaminar percutaneous  Type of procedure: Surgical   Position / Prep / Materials:  Position: Prone  Prep solution: ChloraPrep (2% chlorhexidine  gluconate and 70% isopropyl alcohol) Prep Area: Entire Posterior Thoracolumbar Region  Materials:  Tray: Implant tray (Medtronic) Needle(s):  Type: Epidural  Gauge (G): 14  Length: Regular (10cm)  Qty: 2  H&P (Pre-op Assessment):  Ms. Chou is a 54 y.o. (year old), female patient, seen today for interventional treatment. She  has a past surgical history that includes Foot surgery (Right); Tubal ligation; Colposcopy (2011 or 2012); Parotidectomy (Right, 10/15/2019); Excision of tongue lesion (N/A, 10/15/2019); Laparoscopic vaginal hysterectomy with salpingo oophorectomy (Bilateral, 04/12/2020); Bladder suspension (N/A, 04/12/2020); Anterior and posterior repair (N/A, 04/12/2020); Esophagogastroduodenoscopy (egd) with propofol  (N/A, 01/26/2021); Colonoscopy with propofol  (N/A, 01/26/2021); Abdominal hysterectomy; and Cholecystectomy (N/A, 11/30/2022).  Initial Vital Signs:  Pulse/EKG Rate: 72ECG Heart Rate: 73 Temp: 98.1 F (36.7 C) Resp: 16 BP: (!) 142/87 SpO2: 97 %  BMI: Estimated body mass index is 27.62 kg/m as calculated from the following:   Height as of this encounter: 5' 9 (1.753 m).   Weight as of this encounter: 187 lb (84.8 kg).  Risk Assessment: Allergies: Reviewed. She is allergic to amoxicillin, penicillins, and zithromax  [azithromycin ].  Allergy Precautions: None required Coagulopathies: Reviewed. None identified.  Blood-thinner therapy: None at this time Active Infection(s): Reviewed. None identified. Ms. Posa is afebrile  Site Confirmation: Ms. Lukes was asked to confirm the procedure and laterality before marking the site, which she did. Procedure checklist: Completed Consent: Before the procedure and under the influence of no sedative(s), amnesic(s), or  anxiolytics, the patient was informed of the treatment options, risks and possible complications.  To fulfill our ethical and legal obligations, as recommended by the American Medical Association's Code of Ethics, I have informed the patient of my clinical impression; the nature and purpose of the treatment or procedure; the risks, benefits, and possible complications of the intervention; the alternatives, including doing nothing; the risk(s) and benefit(s) of the alternative treatment(s) or procedure(s); and the risk(s) and benefit(s) of doing nothing.  Ms. Loh was provided with information about the general risks and possible complications associated with most interventional procedures. These include, but are not limited to: failure to achieve desired goals, infection, bleeding, organ or nerve damage, allergic reactions, paralysis, and/or death.  In addition, she was informed of those risks and possible complications associated to this particular procedure, which include, but are not limited to: damage to the implant; failure to decrease pain; local, systemic, or serious CNS infections, intraspinal abscess with possible cord compression and paralysis, or life-threatening such as meningitis; intrathecal and/or epidural bleeding with formation of hematoma with possible spinal cord compression and permanent paralysis; organ damage; nerve injury or damage with subsequent sensory, motor, and/or autonomic system dysfunction, resulting in transient or permanent pain, numbness, and/or weakness of one or several areas of the body; allergic reactions, either minor or major life-threatening, such as anaphylactic or anaphylactoid reactions.  Furthermore, Ms. Depoy was informed of those risks and complications associated with the medications. These include, but are not limited to: allergic reactions (i.e.: anaphylactic or anaphylactoid reactions); arrhythmia;  Hypotension/hypertension; cardiovascular collapse;  respiratory depression and/or shortness of breath; swelling or edema; medication-induced neural toxicity; particulate matter embolism and blood vessel occlusion with resultant organ, and/or nervous system infarction and permanent paralysis.  Finally, she was informed that Medicine is not an exact science; therefore, there is also the possibility of unforeseen or unpredictable risks and/or possible complications that may result in a catastrophic outcome. The patient indicated having understood very clearly. We have given the patient no guarantees and we have made no promises. Enough time was given to the patient to ask questions, all of which were answered to the patient's satisfaction. Ms. Valenta has indicated that she wanted to continue with the procedure. Attestation: I, the ordering provider, attest that I have discussed with the patient the benefits, risks, side-effects, alternatives, likelihood of achieving goals, and potential problems during recovery for the procedure that I have provided informed consent. Date  Time: 05/28/2024  8:06 AM  Pre-Procedure Preparation:  Monitoring: As per clinic protocol. Respiration, ETCO2, SpO2, BP, heart rate and rhythm monitor placed and checked for adequate function Safety Precautions: Patient was assessed for positional comfort and pressure points before starting the procedure. Time-out: I initiated and conducted the Time-out before starting the procedure, as per protocol. The patient was asked to participate by confirming the accuracy of the Time Out information. Verification of the correct person, site, and procedure were performed and confirmed by me, the nursing staff, and the patient. Time-out conducted as per Joint Commission's Universal Protocol (UP.01.01.01). Time: 0841 Start Time: 0841 hrs.  Description/Narrative of Procedure:          Rationale (medical necessity): procedure needed and proper for the diagnosis and/or treatment of the patient's  medical symptoms and needs. Procedural Technique Safety Precautions: Aspiration looking for blood return was conducted prior to all injections. At no point did we inject any substances, as a needle was being advanced. No attempts were made at seeking any paresthesias. Safe injection practices and needle disposal techniques used. Medications properly checked for expiration dates. SDV (single  dose vial) medications used. Description of the Procedure: Protocol guidelines were followed. The patient was assisted into a comfortable position. The target area was identified and the area prepped in the usual manner. Skin & deeper tissues infiltrated with local anesthetic. Appropriate amount of time allowed to pass for local anesthetics to take effect. The procedure needles were then advanced to the target area. Proper needle placement secured. Negative aspiration confirmed. Solution injected in intermittent fashion, asking for systemic symptoms every 0.5cc of injectate. The needles were then removed and the area cleansed, making sure to leave some of the prepping solution back to take advantage of its long term bactericidal properties.  Technical description of procedure: Availability of a responsible, adult driver, and NPO status confirmed. Informed consent was obtained after having discussed risks and possible complications. An IV was started. The patient was then taken to the fluoroscopy suite, where the patient was placed in position for the procedure, over the fluoroscopy table. The patient was then monitored in the usual manner. Fluoroscopy was manipulated to obtain the best possible view of the target. Parallex error was corrected before commencing the procedure. Once a clear view of the target had been obtained, the skin and deeper tissues over the procedure site were infiltrated using lidocaine , loaded in a 10 cc luer-loc syringe with a 0.5 inch, 25-G needle. The introducer needle(s) was/were then inserted  through the skin and deeper tissues. A paramidline approach was used to enter the posterior epidural space at a 30 angle, using Loss-of-resistance Technique with 3 ml of PF-NaCl (0.9% NSS). Correct needle placement was confirmed in the antero-posterior and lateral fluoroscopic views. The lead was gently introduced and manipulated under real-time fluoroscopy, constantly assessing for pain, discomfort, or paresthesias, until the tip rested at the desired level. Both sides were done in identical fashion. Electrode placement was tested until appropriate coverage was attained. Once the patient confirmed that the stimulation was over the desired area, the lead(s) was/were secured in place and the introducer needles removed. This was done under real-time fluoroscopy while observing the electrode tip to avoid unintended migration. The area was covered with a non-occlusive dressing and the patient transported to recovery for further programming.  Vitals:   05/28/24 0905 05/28/24 0910 05/28/24 0920 05/28/24 0931  BP: (!) 143/93 120/71 95/84 (!) 125/91  Pulse:      Resp: 15 16 15 14   Temp:  97.6 F (36.4 C)  98 F (36.7 C)  TempSrc:      SpO2: 100% 100% 99% 98%  Weight:      Height:        Start Time: 0841 hrs. End Time: 0904 (including taping patient) hrs.  Neurostimulator Details:  Lead(s):  Brand: Medtronic         Epidural Access Level:  T12-L1 T12-L1  Lead implant:  Bilateral   No. of Electrodes/Lead:  8 8  Laterality:  Left Right  Location (Top electrode):  T8 T8 (mid)  Model No.: [id number] Same  Length: 60cm Same  Lot No.: [id number]   Imaging Guidance (Spinal):         Type of Imaging Technique: Fluoroscopy Guidance (Spinal) Indication(s): Fluoroscopy guidance for needle placement to enhance accuracy in procedures requiring precise needle localization for targeted delivery of medication in or near specific anatomical locations not easily accessible without such real-time  imaging assistance. Exposure Time: Please see nurses notes. Contrast: None used. Fluoroscopic Guidance: I was personally present during the use of fluoroscopy. Tunnel Vision Technique used to  obtain the best possible view of the target area. Parallax error corrected before commencing the procedure. Direction-depth-direction technique used to introduce the needle under continuous pulsed fluoroscopy. Once target was reached, antero-posterior, oblique, and lateral fluoroscopic projection used confirm needle placement in all planes. Images permanently stored in EMR. Interpretation: No contrast injected. I personally interpreted the imaging intraoperatively. Adequate needle placement confirmed in multiple planes. Permanent images saved into the patient's record.  Antibiotic Prophylaxis:   Anti-infectives (From admission, onward)    Start     Dose/Rate Route Frequency Ordered Stop   05/28/24 0830  ceFAZolin  (ANCEF ) IVPB 2g/100 mL premix        2 g 200 mL/hr over 30 Minutes Intravenous  Once 05/28/24 0826 05/28/24 0856   05/28/24 0000  cephALEXin  (KEFLEX ) 500 MG capsule        500 mg Oral 4 times daily 05/28/24 0820 06/04/24 2359      Indication(s): Implant Prophylaxis.  Post-operative Assessment:  Post-procedure Vital Signs:  Pulse/HCG Rate: 7271 Temp: 98 F (36.7 C) Resp: 14 BP: (!) 125/91 SpO2: 98 %  Complications: No immediate post-treatment complications observed by team, or reported by patient.  Note: The patient tolerated the entire procedure well. A repeat set of vitals were taken after the procedure and the patient was kept under observation following institutional policy, for this type of procedure. Post-procedural neurological assessment was performed, showing return to baseline, prior to discharge. The patient was provided with post-procedure discharge instructions, including a section on how to identify potential problems. Should any problems arise concerning this procedure,  the patient was given instructions to immediately contact us , at any time, without hesitation. In any case, we plan to contact the patient by telephone for a follow-up status report regarding this interventional procedure.  Comments:  No additional relevant information.  Plan of Care Orders:  No orders of the defined types were placed in this encounter.    Medications administered: We administered lidocaine , lactated ringers , midazolam , fentaNYL , and ceFAZolin .  See the medical record for exact dosing, route, and time of administration.    Follow-up plan:   Return in about 1 week (around 06/04/2024) for SCS lead pull.     Recent Visits Date Type Provider Dept  05/05/24 Office Visit Marcelino Nurse, MD [facility]  Showing recent visits within past 90 days and meeting all other requirements Today's Visits Date Type Provider Dept  05/28/24 Procedure visit Marcelino Nurse, MD [facility]  Showing today's visits and meeting all other requirements Future Appointments Date Type Provider Dept  06/03/24 Appointment Marcelino Nurse, MD [facility]  Showing future appointments within next 90 days and meeting all other requirements  Disposition: Discharge home  Discharge (Date  Time): 05/28/2024; 0955 hrs.   Primary Care Physician: Corwin Antu, FNP Location: [facility] Note by: Nurse Marcelino, MD (TTS technology used. I apologize for any typographical errors that were not detected and corrected.) Date: 05/28/2024; Time: 10:43 AM

## 2024-05-28 NOTE — Progress Notes (Signed)
 "  Office Visit Note  Patient: Erin Dorsey             Date of Birth: [birth year]           MRN: [id number]             PCP: Corwin Antu, FNP Referring: Corwin Antu, FNP Visit Date: 06/05/2024   Subjective:   Discussed the use of AI scribe software for clinical note transcription with the patient, who gave verbal consent to proceed.  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  She experiences severe dryness in her mouth and eyes. Over-the-counter lubricating eye drops and a humidifier are used to manage symptoms, but no specific medication for dry mouth has been initiated. The dryness persists without significant improvement.  She recently underwent a trial procedure involving a spinal cord device, which was removed yesterday. She is currently on antibiotics following the procedure with a small area of skin inflammation at her low back. The device provided some relief. She reports swelling in her feet and occasional swelling in her hands, with a history of her feet turning purple and red. She is following up with a vein doctor after initial consultation in November. At that time recommendation for lymphedema pump and limiting peripheral neuropathy medications that would cause swelling, with plan for lower extremity venous study and the follow next month.  She recently completed a course of prednisone  for her ear, tapering off two days ago after a week of use. She is also on sinus medication and a nasal spray. No recent use of steroid medications other than the completed prednisone  course.  She has a history of surgery on the right side of her face, which sometimes swells. No current swelling in the cheeks or under the jaw. No issues with glaucoma or eye pressure, only dry eyes.   Previous HPI 03/04/2024 Erin Dorsey is a 54 year old female with positive ANA and  neuropathy who presents with abnormal lab results and symptoms of dryness and neuropathy.   She is experiencing continued symptoms of dryness and neuropathy affcting her legs with numbness and jerking movements in loewr legs and radiating pain from the low back down both legs.  She remains on Elavil  25 mg at night, baclofen  10 mg twice daily as needed, and gabapentin  400 mg 3 times daily for neuropathy and muscle spasticity is also on Suboxone.   Lab test at her initial visit were negative for elevated serum immunoglobulins, normal serum complement, normal sedimentation rate but with a high CRP of 21.6.  Since her last visit she also had updated EMG study showed findings consistent for a generalized sensorimotor polyneuropathy as well as chronic bilateral S1 nerve root impingement.   She has a history of leg swelling and redness, although her legs are not swollen today. She experiences soreness in her legs and morning stiffness in her fists, which lasts for a few minutes after waking up.   She has been experiencing an earache since last Wednesday, with pain radiating from her ear down to her shoulder and arm. No sinus congestion or general malaise is noted, with the pain localized to her ear.     Previous HPI 12/17/23 Erin Dorsey is a 54 year old female who presents for evaluation of positive SSA/Rho antibodies checked in association with multiple symptoms including joint pains and swelling, dryness,  but also tremor and jerking movement difficulties. She is accompanied by her husband, [name].   She has been experiencing tremors and jerking movements since January, which occur suddenly and sometimes lead to falls. These episodes can happen unexpectedly and involve full-body jerking. She has had falls, including a recent one while climbing stairs, although she sometimes can catch herself when falling. She associates these episodes with her 'sonic nerve' issues.   For the past two months,  she has noticed swelling in her feet, accompanied by tightness, tingling, and redness. No similar swelling in her hands. She is currently taking Lasix  and wears diabetic socks for this with some improvement.   She experiences chronic dryness in her eyes and mouth. She uses AT/lubricating eye drops for her dry eyes and manages her dry mouth by drinking fluids although most often prefers Sprite.   She experiences sensitivity to sunlight, which causes her to feel hot and have difficulty breathing. She also reports joint pain in her hips, back, and feet, with her feet swelling and turning red. She develops a recurring rash under her breast treated with topical nystatin .   She had previous removal of a benign mass on right side of her neck.  Does not report any other problems with major salivary gland or cervical lymph node swelling that she has noticed.  Does not get any discoloration in fingertips with cold exposure.  No history of blood clots.   Review of Systems  Constitutional:  Positive for fatigue.  HENT:  Positive for mouth sores and mouth dryness.   Eyes:  Positive for dryness.  Respiratory:  Positive for shortness of breath.   Cardiovascular:  Positive for chest pain. Negative for palpitations.  Gastrointestinal:  Positive for diarrhea. Negative for blood in stool and constipation.  Endocrine: Positive for increased urination.  Genitourinary:  Positive for involuntary urination.  Musculoskeletal:  Positive for joint pain, gait problem, joint pain, joint swelling, myalgias, muscle weakness, morning stiffness, muscle tenderness and myalgias.  Skin:  Positive for rash and hair loss. Negative for color change and sensitivity to sunlight.  Allergic/Immunologic: Positive for susceptible to infections.  Neurological:  Positive for dizziness and headaches.  Hematological:  Negative for swollen glands.  Psychiatric/Behavioral:  Positive for depressed mood and sleep disturbance. The patient is  nervous/anxious.     PMFS History:  Patient Active Problem List   Diagnosis Date Noted   CRP elevated 03/04/2024   Lumbar spondylosis 02/22/2024   Degeneration of intervertebral disc of lumbar region with discogenic back pain and lower extremity pain 02/22/2024   Lumbar radiculopathy 02/22/2024   Asymptomatic varicose veins of both lower extremities 02/21/2024   Corns and callosities 02/04/2024   Plantar wart, right foot 02/04/2024   Plantar wart of right foot 01/15/2024   Prediabetes 01/15/2024   B12 deficiency 11/12/2023   Intertrigo 11/12/2023   Pedal edema 11/12/2023   Elevated brain natriuretic peptide (BNP) level 11/12/2023   DOE (dyspnea on exertion) 11/12/2023   Positive ANA (antinuclear antibody) 07/16/2023   Abnormal EEG 07/16/2023   Moderate obstructive sleep apnea 07/10/2023   Stuttering 05/17/2023   Chronic right-sided low back pain with right-sided sciatica 05/07/2023   Ataxia 05/07/2023   Expressive aphasia 05/07/2023   Spasticity 05/07/2023   Weakness of right lower extremity 05/07/2023   Frequent falls 01/15/2023   Other fatigue 01/15/2023   Excessive daytime sleepiness 01/15/2023   History of marijuana use 11/28/2022   Vitamin B12 deficiency 11/28/2022   Hypokalemia 11/28/2022   Mild intermittent  asthma without complication 11/28/2022   Chronic nausea 08/29/2022   Hyperglycemia 08/29/2022   Chronic bilateral low back pain with bilateral sciatica 08/29/2022   Cheilitis 08/29/2022   Gastroesophageal reflux disease with esophagitis without hemorrhage 08/29/2022   Bursitis of hip 08/29/2022   History of bipolar disorder 08/29/2022   Mixed hyperlipidemia 08/29/2022   Polypharmacy 08/29/2022   Recurrent moderate major depressive disorder with anxiety (HCC) 04/27/2021   Calculus of gallbladder without cholecystitis without obstruction 04/27/2021   Other chronic pain 04/27/2021   Seasonal allergic rhinitis due to pollen 08/17/2020   Generalized anxiety  disorder 02/03/2020   Genital herpes simplex 01/12/2020   Hx of cervical cancer 08/06/2019   Obesity (BMI 30.0-34.9) 08/06/2019   Essential hypertension 08/06/2019    Past Medical History:  Diagnosis Date   Allergy    Anxiety    Cervical cancer (HCC) 2011   stage 3   Chronic headaches    Depression    Edema 10/01/2023   Fatty liver    Frequent falls    GERD (gastroesophageal reflux disease)    Lumbar radiculopathy    Moderate obstructive sleep apnea 07/10/2023    Family History  Problem Relation Age of Onset   Hypertension Mother    Anxiety disorder Mother    Arthritis Mother    COPD Mother    Thyroid  disease Mother    Kidney disease Father        was on dialysis   Obesity Father    Uterine cancer Sister    Cervical cancer Sister    ADD / ADHD Brother    Cervical cancer Maternal Grandmother    Cancer Maternal Grandmother    Varicose Veins Maternal Grandmother    Lung cancer Maternal Grandfather    Brain cancer Maternal Grandfather    Cancer Maternal Grandfather    Deafness Daughter    Thyroid  disease Daughter    ADD / ADHD Daughter    Breast cancer Paternal Aunt    Past Surgical History:  Procedure Laterality Date   ABDOMINAL HYSTERECTOMY     Total   ANTERIOR AND POSTERIOR REPAIR N/A 04/12/2020   Procedure: ANTERIOR (CYSTOCELE) AND POSTERIOR REPAIR (RECTOCELE);  Surgeon: Janit Alm Agent, MD;  Location: [facility] ORS;  Service: Gynecology;  Laterality: N/A;   BLADDER SUSPENSION N/A 04/12/2020   Procedure: [facility] PROCEDURE;  Surgeon: Janit Alm Agent, MD;  Location: [facility] OR[facility];  Service: Gynecology;  Laterality: N/A;  TOT   CHOLECYSTECTOMY N/A 11/30/2022   Procedure: LAPAROSCOPIC CHOLECYSTECTOMY;  Surgeon: Dasie Leonor CROME, MD;  Location: [facility] OR;  Service: General;  Laterality: N/A;   COLONOSCOPY WITH PROPOFOL  N/A 01/26/2021   Procedure: COLONOSCOPY WITH PROPOFOL ;  Surgeon: Dessa Reyes ORN, MD;  Location: [facility];  Service: Endoscopy;  Laterality: N/A;    COLPOSCOPY  2011 or 2012   stage 3-removed cancerous spot on the cervix   ESOPHAGOGASTRODUODENOSCOPY (EGD) WITH PROPOFOL  N/A 01/26/2021   Procedure: ESOPHAGOGASTRODUODENOSCOPY (EGD) WITH PROPOFOL ;  Surgeon: Dessa Reyes ORN, MD;  Location: [facility] END[facility];  Service: Endoscopy;  Laterality: N/A;   EXCISION OF TONGUE LESION N/A 10/15/2019   Procedure: EXCISION OF TONGUE LESION;  Surgeon: Blair Mt, MD;  Location: [facility] OR[facility];  Service: ENT;  Laterality: N/A;   FOOT SURGERY Right    x 2-took knots out from bottom of the foot-not sure of the technical name   LAPAROSCOPIC VAGINAL HYSTERECTOMY WITH SALPINGO OOPHORECTOMY Bilateral 04/12/2020   Procedure: LAPAROSCOPIC ASSISTED VAGINAL HYSTERECTOMY WITH SALPINGO OOPHORECTOMY;  Surgeon: Janit Alm Agent, MD;  Location: [facility] OR[facility];  Service: Gynecology;  Laterality: Bilateral;   PAROTIDECTOMY Right 10/15/2019   Procedure: PAROTIDECTOMY;  Surgeon: Blair Mt, MD;  Location: [facility] OR[facility];  Service: ENT;  Laterality: Right;   SPINAL CORD STIMULATOR REMOVAL  06/05/2023   SPINAL CORD STIMULATOR TRIAL  05/28/2024   TUBAL LIGATION     Social History   Social History Narrative   08/06/19   From: [place] , came here to be close to daughter   Living: with husband [name] together since 2011 and married since 2021   Work: not currently, use to work at [employer]      Family: 9 children  - in [place]  - ok relationship - 1 grandchild / with curtistine has 21 step grandchildren      Enjoys: walk, watch movies, painting, cross stitch      Exercise: walking   Diet: eats whatever      Safety   Seat belts: Yes    Guns: No   Safe in relationships: Yes    Caffiene: sprite, no caffiene      Immunization History  Administered Date(s) Administered   Influenza Inj Mdck Quad With Preservative 03/31/2022   Influenza,inj,Quad PF,6+ Mos 01/07/2020   Tdap 08/06/2019     Objective: Vital Signs: BP 107/72   Pulse 84   Temp 97.8 F (36.6 C)   Resp 16   Ht 5' 9  (1.753 m)   Wt 195 lb 4 oz (88.6 kg)   LMP 06/23/2015   BMI 28.83 kg/m    Physical Exam HENT:     Mouth/Throat:     Comments: Erythematous tongue with some flattening of lingual papillae No oral ulcers Eyes:     Conjunctiva/sclera: Conjunctivae normal.  Cardiovascular:     Rate and Rhythm: Normal rate and regular rhythm.  Pulmonary:     Effort: Pulmonary effort is normal.     Breath sounds: Normal breath sounds.  Lymphadenopathy:     Cervical: No cervical adenopathy.  Skin:    General: Skin is warm and dry.     Comments: 2 <1cm erythematous spots midline of low back with central scabbing  Neurological:     Mental Status: She is alert.     Comments: Normal knee jerk and ankle jerk reflexes Sensation in lower extremities grossly intact, but decree sensitivity to light touch in bilateral ankles and feet   Psychiatric:        Mood and Affect: Mood normal.      Musculoskeletal Exam:  Shoulders full ROM no tenderness or swelling Elbows full ROM no tenderness or swelling Wrists full ROM no tenderness or swelling Fingers full ROM no tenderness or swelling Bilateral low back muscle tenderness to pressure and extending to lateral hip, no radiation, tolerates normal internal and external rotation range of motion Knees full ROM, some tightness against full extension while seated   Investigation: No additional findings.  Imaging: DG PAIN CLINIC C-ARM 1-60 MIN NO REPORT Result Date: 06/04/2024 Fluoro was used, but no Radiologist interpretation will be provided. Please refer to NOTES tab for provider progress note.  DG PAIN CLINIC C-ARM 1-60 MIN NO REPORT Result Date: 05/28/2024 Fluoro was used, but no Radiologist interpretation will be provided. Please refer to NOTES tab for provider progress note.   Recent Labs: Lab Results  Component Value Date   WBC 4.0 02/21/2024   HGB 13.9 02/21/2024   PLT 180.0 02/21/2024   NA 142 01/15/2024   K 3.8 02/21/2024   CL 101  01/15/2024  CO2 36 (H) 01/15/2024   GLUCOSE 68 (L) 01/15/2024   BUN 8 01/15/2024   CREATININE 0.57 01/15/2024   BILITOT 0.3 01/15/2024   ALKPHOS 47 01/15/2024   AST 19 01/15/2024   ALT 13 01/15/2024   PROT 6.6 01/15/2024   ALBUMIN 3.7 01/15/2024   CALCIUM 9.0 01/15/2024   GFRAA >60 07/22/2015    Speciality Comments: No specialty comments available.  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 Neuropathy managed by other specialists. Discussed her labs do not strongly indicate active inflammatory disease process affecting nerves, but can monitor for any progression over time. Will try starting mediation for dry mouth and eyes for symptom management. - Prescribed pilocarpine  (Salagen ) 5 mg at night, may increase to twice daily if effective and tolerated. - Continue over-the-counter lubricating eye drops and humidifier. - Encourage hydration and use of gum or lozenges for saliva stimulation. - Provided information on pilocarpine  side effects: sweating, diarrhea, rare palpitations. - Advised to contact office for medication issues or dose adjustment.   Orders:   pilocarpine  (SALAGEN ) 5 MG tablet; Take 1 tablet (5 mg total) by mouth at bedtime as needed.     Follow-Up Instructions: Return in about 3 months (around 09/02/2024) for pSS pilocarpine  start f/u 3mos.   Lonni LELON Ester, MD  Note - This record has been created using [employer].  Chart creation errors have been sought, but may not always  have been located. Such creation errors do not reflect on  the standard of medical care. "

## 2024-05-28 NOTE — Assessment & Plan Note (Addendum)
 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 Neuropathy managed by other specialists. Discussed her labs do not strongly indicate active inflammatory disease process affecting nerves, but can monitor for any progression over time. Will try starting mediation for dry mouth and eyes for symptom management. - Prescribed pilocarpine  (Salagen ) 5 mg at night, may increase to twice daily if effective and tolerated. - Continue over-the-counter lubricating eye drops and humidifier. - Encourage hydration and use of gum or lozenges for saliva stimulation. - Provided information on pilocarpine  side effects: sweating, diarrhea, rare palpitations. - Advised to contact office for medication issues or dose adjustment.   Orders:   pilocarpine  (SALAGEN ) 5 MG tablet; Take 1 tablet (5 mg total) by mouth at bedtime as needed.

## 2024-05-29 ENCOUNTER — Telehealth: Payer: Self-pay

## 2024-05-29 NOTE — Telephone Encounter (Signed)
 Post procedure follow up.  Patient states she is doing good.

## 2024-06-02 ENCOUNTER — Other Ambulatory Visit (HOSPITAL_COMMUNITY): Payer: Self-pay

## 2024-06-02 ENCOUNTER — Telehealth: Payer: Self-pay | Admitting: Pharmacy Technician

## 2024-06-03 ENCOUNTER — Ambulatory Visit: Admitting: Student in an Organized Health Care Education/Training Program

## 2024-06-03 NOTE — Telephone Encounter (Signed)
 Pharmacy Patient Advocate Encounter  Received notification from [facility] [employer] that Prior Authorization for Wegovy  0.25MG /0.5ML auto-injectors  has been DENIED.  Full denial letter will be uploaded to the media tab. See denial reason below.   PA #/Case ID/Reference #: [id number]

## 2024-06-04 ENCOUNTER — Ambulatory Visit (HOSPITAL_BASED_OUTPATIENT_CLINIC_OR_DEPARTMENT_OTHER): Admitting: Student in an Organized Health Care Education/Training Program

## 2024-06-04 ENCOUNTER — Ambulatory Visit
Admission: RE | Admit: 2024-06-04 | Discharge: 2024-06-04 | Disposition: A | Source: Ambulatory Visit | Attending: Student in an Organized Health Care Education/Training Program | Admitting: Student in an Organized Health Care Education/Training Program

## 2024-06-04 ENCOUNTER — Encounter: Payer: Self-pay | Admitting: Student in an Organized Health Care Education/Training Program

## 2024-06-04 VITALS — BP 147/89 | HR 77 | Temp 97.3°F | Resp 16 | Ht 69.0 in | Wt 189.0 lb

## 2024-06-04 DIAGNOSIS — G8929 Other chronic pain: Secondary | ICD-10-CM

## 2024-06-04 DIAGNOSIS — M47816 Spondylosis without myelopathy or radiculopathy, lumbar region: Secondary | ICD-10-CM

## 2024-06-04 DIAGNOSIS — M5416 Radiculopathy, lumbar region: Secondary | ICD-10-CM

## 2024-06-04 NOTE — Progress Notes (Signed)
 1050 scs lead removal per Dr. Marcelino with fluoro. Leads intact. Some redness noted at lead intertion site  insertions. Wound care instructions given. Patient knows to pick up antibiotics. States finished initial round as prescribed. Site borders marked for patient and ss to report to MD explained.

## 2024-06-04 NOTE — Progress Notes (Signed)
 PROVIDER NOTE: Interpretation of information contained herein should be left to medically-trained personnel. Specific patient instructions are provided elsewhere under Patient Instructions section of medical record. This document was created in part using AI and STT-dictation technology, any transcriptional errors that may result from this process are unintentional.  Patient: Erin Dorsey  Service: E/M Post-op Encounter  PCP: Corwin Antu, FNP  DOB: [birth year]  DOS: 06/04/2024  Provider: Wallie Sherry, MD  MRN: [id number]  Delivery: Face-to-face  Specialty: Interventional Pain Management  Type: Established Patient  Setting: Ambulatory outpatient facility  Specialty designation: 09  Referring Prov.: Corwin Antu, FNP  Location: Outpatient office facility  SCS TRIAL POST-OP EVALUATION     Primary Reason(s) for Visit: Encounter for removal of temporary spinal cord stimulator lead(s) and evaluation of trial implant. CC: Back Pain (lower)  HPI  Erin Dorsey is a 54 y.o. year old, female patient, who comes today for a post-procedure evaluation. She has Hx of cervical cancer; Obesity (BMI 30.0-34.9); Essential hypertension; Genital herpes simplex; Generalized anxiety disorder; Seasonal allergic rhinitis due to pollen; Recurrent moderate major depressive disorder with anxiety (HCC); Calculus of gallbladder without cholecystitis without obstruction; Other chronic pain; Chronic nausea; Hyperglycemia; Chronic bilateral low back pain with bilateral sciatica; Cheilitis; Gastroesophageal reflux disease with esophagitis without hemorrhage; Bursitis of hip; History of bipolar disorder; Mixed hyperlipidemia; Polypharmacy; History of marijuana use; Vitamin B12 deficiency; Hypokalemia; Mild intermittent asthma without complication; Frequent falls; Other fatigue; Excessive daytime sleepiness; Chronic right-sided low back pain with right-sided sciatica; Ataxia; Expressive aphasia; Spasticity; Weakness of right lower  extremity; Stuttering; Moderate obstructive sleep apnea; Positive ANA (antinuclear antibody); Abnormal EEG; B12 deficiency; Intertrigo; Pedal edema; Elevated brain natriuretic peptide (BNP) level; DOE (dyspnea on exertion); Plantar wart of right foot; Prediabetes; Corns and callosities; Plantar wart, right foot; Asymptomatic varicose veins of both lower extremities; Lumbar spondylosis; Degeneration of intervertebral disc of lumbar region with discogenic back pain and lower extremity pain; Lumbar radiculopathy; and CRP elevated on their problem list. Her primarily concern today is the Back Pain (lower)  Pain Assessment: Location: Left, Lower Back Radiating: left leg to the foot Onset: More than a month ago Duration: Chronic pain Quality: Sharp Severity: 9 /10 (subjective, self-reported pain score)  Effect on ADL:   Timing: Constant Modifying factors: ice BP: (!) 147/89  HR: 77  Erin Dorsey comes in today, after a SCS (Spinal Cord Stimulator) Trial Implant on 05/29/2024, to have her percutaneous, temporary neurostimulator lead(s) removed and to evaluate the trial experience to determine if a permanent implant may be effective in controlling some or all of her chronic pain symptoms.  Further details on both, my assessment(s), as well as the proposed treatment plan, please see below.  Patient had a successful spinal cord stimulator trial.  She endorses greater than 50% pain relief during her SCS trial also endorses improvement in performing ADLs. Trial leads were removed with tips intact.  She does have slight erythema and tenderness to palpation at insertion site.  She finished her antibiotics that she was on for 7 days for the duration of her SCS trial.  I encouraged her to continue to monitor insertion site and if redness worsens or if local pain becomes more severe to let me know.  Post-operative Assessment Intra-procedural problems/complications: None observed.         Reported side-effects:  None.        Post-surgical adverse reactions or complications: Slight redness and tenderness to palpation at insertion site  Laboratory Chemistry Profile   Renal  Lab Results  Component Value Date   BUN 8 01/15/2024   CREATININE 0.57 01/15/2024   BCR 15 05/17/2023   GFR 105.17 01/15/2024   GFRAA >60 07/22/2015   GFRNONAA >60 11/23/2022   SPECGRAV 1.010 02/17/2020   PHUR 7.0 02/17/2020   PROTEINUR NEGATIVE 10/01/2022     Electrolytes Lab Results  Component Value Date   NA 142 01/15/2024   K 3.8 02/21/2024   CL 101 01/15/2024   CALCIUM 9.0 01/15/2024     Hepatic Lab Results  Component Value Date   AST 19 01/15/2024   ALT 13 01/15/2024   ALBUMIN 3.7 01/15/2024   ALKPHOS 47 01/15/2024   LIPASE 27 10/01/2022     ID Lab Results  Component Value Date   HIV Non Reactive 05/17/2023   SARSCOV2NAA NEGATIVE 10/01/2022   PREGTESTUR NEGATIVE 04/12/2020     Bone Lab Results  Component Value Date   VD25OH 31.3 05/17/2023     Endocrine Lab Results  Component Value Date   GLUCOSE 68 (L) 01/15/2024   GLUCOSEU NEGATIVE 10/01/2022   HGBA1C 6.0 08/29/2022   TSH 3.860 05/17/2023     Neuropathy Lab Results  Component Value Date   VITAMINB12 419 11/12/2023   HGBA1C 6.0 08/29/2022   HIV Non Reactive 05/17/2023     CNS No results found for: COLORCSF, APPEARCSF, RBCCOUNTCSF, WBCCSF, POLYSCSF, LYMPHSCSF, EOSCSF, PROTEINCSF, GLUCCSF, JCVIRUS, CSFOLI, IGGCSF, LABACHR, ACETBL   Inflammation (CRP: Acute  ESR: Chronic) Lab Results  Component Value Date   CRP 18.7 (H) 03/04/2024   ESRSEDRATE 14 12/17/2023     Rheumatology Lab Results  Component Value Date   ANA Positive (A) 05/17/2023     Coagulation Lab Results  Component Value Date   PLT 180.0 02/21/2024     Cardiovascular Lab Results  Component Value Date   CKTOTAL 66 05/17/2023   HGB 13.9 02/21/2024   HCT 42.2 02/21/2024     Screening Lab Results  Component Value Date    SARSCOV2NAA NEGATIVE 10/01/2022   HIV Non Reactive 05/17/2023   PREGTESTUR NEGATIVE 04/12/2020     Cancer No results found for: CEA, CA125, LABCA2   Allergens No results found for: ALMOND, APPLE, ASPARAGUS, AVOCADO, BANANA, BARLEY, BASIL, BAYLEAF, GREENBEAN, LIMABEAN, WHITEBEAN, BEEFIGE, REDBEET, BLUEBERRY, BROCCOLI, CABBAGE, MELON, CARROT, CASEIN, CASHEWNUT, CAULIFLOWER, CELERY     Note: Lab results reviewed.   Meds  Current Medications[1]  ROS  Constitutional: Denies any fever or chills Gastrointestinal: No reported hemesis, hematochezia, vomiting, or acute GI distress Musculoskeletal: Denies any acute onset joint swelling, redness, loss of ROM, or weakness Neurological: No reported episodes of acute onset apraxia, aphasia, dysarthria, agnosia, amnesia, paralysis, loss of coordination, or loss of consciousness  Allergies  Ms. Ron is allergic to amoxicillin, penicillins, and zithromax  [azithromycin ].  PFSH  Drug: Ms. Burger  reports that she does not currently use drugs after having used the following drugs: Marijuana. Frequency: 1.00 time per week. Alcohol:  reports no history of alcohol use. Tobacco:  reports that she quit smoking about 4 years ago. Her smoking use included cigarettes. She started smoking about 24 years ago. She has a 5 pack-year smoking history. She has been exposed to tobacco smoke. She has never used smokeless tobacco. Medical:  has a past medical history of Allergy, Anxiety, Cervical cancer (HCC) (2011), Chronic headaches, Depression, Edema (10/01/2023), Fatty liver, Frequent falls, GERD (gastroesophageal reflux disease), Lumbar radiculopathy, and Moderate obstructive sleep apnea (07/10/2023). Surgical: Ms. Wohlford  has a past surgical history that includes Foot  surgery (Right); Tubal ligation; Colposcopy (2011 or 2012); Parotidectomy (Right, 10/15/2019); Excision of tongue lesion (N/A, 10/15/2019); Laparoscopic  vaginal hysterectomy with salpingo oophorectomy (Bilateral, 04/12/2020); Bladder suspension (N/A, 04/12/2020); Anterior and posterior repair (N/A, 04/12/2020); Esophagogastroduodenoscopy (egd) with propofol  (N/A, 01/26/2021); Colonoscopy with propofol  (N/A, 01/26/2021); Abdominal hysterectomy; and Cholecystectomy (N/A, 11/30/2022). Family: family history includes ADD / ADHD in her brother and daughter; Anxiety disorder in her mother; Arthritis in her mother; Brain cancer in her maternal grandfather; Breast cancer in her paternal aunt; COPD in her mother; Cancer in her maternal grandfather and maternal grandmother; Cervical cancer in her maternal grandmother and sister; Deafness in her daughter; Hypertension in her mother; Kidney disease in her father; Lung cancer in her maternal grandfather; Obesity in her father; Thyroid  disease in her daughter and mother; Uterine cancer in her sister; Varicose Veins in her maternal grandmother.  Postop Exam  General appearance: Afebrile. Well nourished, well developed, and well hydrated. In no apparent acute distress. Vitals:   06/04/24 1014  BP: (!) 147/89  Pulse: 77  Resp: 16  Temp: (!) 97.3 F (36.3 C)  TempSrc: Temporal  SpO2: 96%  Weight: 189 lb (85.7 kg)  Height: 5' 9 (1.753 m)   BMI Assessment: Estimated body mass index is 27.91 kg/m as calculated from the following:   Height as of this encounter: 5' 9 (1.753 m).   Weight as of this encounter: 189 lb (85.7 kg).  Surgical site: Slight tenderness and erythema at insertion site.  Will continue to monitor.  No discharge, abnormal odors, or any other evidence of infection or complications.  Patient instructed to contact us  if redness gets worse or if her pain increases.  Assessment  Primary Diagnosis & Pertinent Problem List: The primary encounter diagnosis was Chronic radicular lumbar pain. Diagnoses of Lumbar radiculopathy and Lumbar facet arthropathy were also pertinent to this visit.  Diagnosis   1. Chronic radicular lumbar pain   2. Lumbar radiculopathy   3. Lumbar facet arthropathy      Plan of Care Referral to neurosurgery for SCS implant Orders:  Orders Placed This Encounter  Procedures   DG PAIN CLINIC C-ARM 1-60 MIN NO REPORT    Intraoperative interpretation by procedural physician at [facility].    Standing Status:   Standing    Number of Occurrences:   1    Reason for exam::   Assistance in needle guidance and placement for procedures requiring needle placement in or near specific anatomical locations not easily accessible without such assistance.   Ambulatory referral to Neurosurgery    Referral Priority:   Routine    Referral Type:   Surgical    Referral Reason:   Specialty Services Required    Requested Specialty:   Neurosurgery    Number of Visits Requested:   1    Medications administered: Erin Para Cramp had no medications administered during this visit.  See the medical record for exact dosing, route, and time of administration.   Follow-up plan:   No follow-ups on file.     Recent Visits Date Type Provider Dept  05/28/24 Procedure visit Marcelino Nurse, MD [facility]  05/05/24 Office Visit Marcelino Nurse, MD [facility]  Showing recent visits within past 90 days and meeting all other requirements Today's Visits Date Type Provider Dept  06/04/24 Procedure visit Marcelino Nurse, MD [facility]  Showing today's visits and meeting all other requirements Future Appointments No visits were found meeting these conditions. Showing future appointments within  next 90 days and meeting all other requirements  Disposition: Discharge home  Discharge (Date  Time): 06/04/2024; 1045 hrs.   Primary Care Physician: Corwin Antu, FNP Location: [facility] Note by: Wallie Sherry, MD (TTS technology used. I apologize for any typographical errors that were not detected and corrected.) Date:  06/04/2024; Time: 1:12 PM    [1]  Current Outpatient Medications:    amitriptyline  (ELAVIL ) 25 MG tablet, TAKE 1 TABLET BY MOUTH EVERYDAY AT BEDTIME, Disp: 90 tablet, Rfl: 3   baclofen  (LIORESAL ) 10 MG tablet, TAKE 1 TABLET BY MOUTH TWICE A DAY AS NEEDED FOR MUSCLE SPASMS, Disp: 180 tablet, Rfl: 1   busPIRone  (BUSPAR ) 15 MG tablet, TAKE 1 TABLET BY MOUTH 3 TIMES DAILY., Disp: 90 tablet, Rfl: 5   cephALEXin  (KEFLEX ) 500 MG capsule, Take 1 capsule (500 mg total) by mouth 4 (four) times daily for 7 days., Disp: 28 capsule, Rfl: 0   clotrimazole  (CLOTRIMAZOLE  ANTI-FUNGAL) 1 % cream, Apply 1 application twice daily for two weeks, Disp: 30 g, Rfl: 0   clotrimazole -betamethasone  (LOTRISONE ) cream, Apply 1 Application topically daily., Disp: , Rfl:    FLUoxetine  HCl 60 MG TABS, TAKE 60 MG BY MOUTH DAILY., Disp: 90 tablet, Rfl: 3   fluticasone  (FLONASE ) 50 MCG/ACT nasal spray, Place 2 sprays into both nostrils daily., Disp: 16 g, Rfl: 6   fluticasone  (FLOVENT  HFA) 110 MCG/ACT inhaler, Inhale 1 puff into the lungs in the morning and at bedtime., Disp: 1 each, Rfl: 12   furosemide  (LASIX ) 20 MG tablet, TAKE 1 TABLET BY MOUTH EVERY DAY, Disp: 90 tablet, Rfl: 1   gabapentin  (NEURONTIN ) 400 MG capsule, Take 400 mg by mouth 3 (three) times daily., Disp: , Rfl:    Hydrocortisone  Acetate 1 % CREA, Apply 1 application  topically daily as needed (heat rash)., Disp: 15 g, Rfl: 0   hydrOXYzine  (ATARAX ) 25 MG tablet, TAKE 1 TABLET BY MOUTH EVERYDAY AT BEDTIME, Disp: 30 tablet, Rfl: 5   levocetirizine (XYZAL ) 5 MG tablet, Take 1 tablet (5 mg total) by mouth every evening., Disp: 90 tablet, Rfl: 0   mupirocin  ointment (BACTROBAN ) 2 %, APPLY TO AFFECTED AREA TOPICALLY EVERY DAY, Disp: 22 g, Rfl: 0   nystatin  (MYCOSTATIN /NYSTOP ) powder, APPLY TO AFFECTED AREA 3 TIMES A DAY, Disp: 15 g, Rfl: 5   omeprazole  (PRILOSEC) 20 MG capsule, TAKE 1 CAPSULE BY MOUTH EVERY DAY, Disp: 90 capsule, Rfl: 1   ondansetron  (ZOFRAN ) 4 MG  tablet, TAKE 1 TABLET BY MOUTH EVERY 8 HOURS AS NEEDED FOR NAUSEA AND VOMITING, Disp: 20 tablet, Rfl: 0   pravastatin (PRAVACHOL) 10 MG tablet, Take 10 mg by mouth at bedtime., Disp: , Rfl:    predniSONE  (STERAPRED UNI-PAK 21 TAB) 10 MG (21) TBPK tablet, Take as directed, Disp: 1 each, Rfl: 0   promethazine  (PHENERGAN ) 25 MG tablet, Take 25 mg by mouth every 6 (six) hours as needed for nausea or vomiting., Disp: , Rfl:    semaglutide -weight management (WEGOVY ) 0.25 MG/0.5ML SOAJ SQ injection, Inject 0.25 mg into the skin once a week., Disp: 2 mL, Rfl: 0   valACYclovir  (VALTREX ) 500 MG tablet, TAKE 2 TABLETS BY MOUTH EVERY DAY, Disp: 60 tablet, Rfl: 2   VENTOLIN  HFA 108 (90 Base) MCG/ACT inhaler, TAKE 2 PUFFS BY MOUTH EVERY 6 HOURS AS NEEDED FOR WHEEZE OR SHORTNESS OF BREATH, Disp: 18 each, Rfl: 2

## 2024-06-05 ENCOUNTER — Ambulatory Visit: Admitting: Internal Medicine

## 2024-06-05 ENCOUNTER — Encounter: Payer: Self-pay | Admitting: Neurosurgery

## 2024-06-05 ENCOUNTER — Ambulatory Visit: Payer: Self-pay | Admitting: Neurosurgery

## 2024-06-05 ENCOUNTER — Ambulatory Visit: Admitting: Neurosurgery

## 2024-06-05 ENCOUNTER — Other Ambulatory Visit: Payer: Self-pay

## 2024-06-05 ENCOUNTER — Encounter: Payer: Self-pay | Admitting: Internal Medicine

## 2024-06-05 VITALS — BP 107/72 | HR 84 | Temp 97.8°F | Resp 16 | Ht 69.0 in | Wt 195.2 lb

## 2024-06-05 VITALS — BP 132/84 | Ht 69.0 in | Wt 195.0 lb

## 2024-06-05 DIAGNOSIS — R7689 Other specified abnormal immunological findings in serum: Secondary | ICD-10-CM

## 2024-06-05 DIAGNOSIS — M51362 Other intervertebral disc degeneration, lumbar region with discogenic back pain and lower extremity pain: Secondary | ICD-10-CM

## 2024-06-05 DIAGNOSIS — M47816 Spondylosis without myelopathy or radiculopathy, lumbar region: Secondary | ICD-10-CM

## 2024-06-05 DIAGNOSIS — G894 Chronic pain syndrome: Secondary | ICD-10-CM

## 2024-06-05 DIAGNOSIS — M5416 Radiculopathy, lumbar region: Secondary | ICD-10-CM

## 2024-06-05 DIAGNOSIS — Z01818 Encounter for other preprocedural examination: Secondary | ICD-10-CM

## 2024-06-05 DIAGNOSIS — K117 Disturbances of salivary secretion: Secondary | ICD-10-CM | POA: Diagnosis not present

## 2024-06-05 MED ORDER — PILOCARPINE HCL 5 MG PO TABS: 5.0000 mg | ORAL_TABLET | Freq: Every evening | ORAL | 2 refills | Status: AC | PRN

## 2024-06-05 NOTE — Patient Instructions (Signed)
 Please see below for information in regards to your upcoming surgery:   Planned surgery: Thoracic Laminectomy for Spinal Cord Stimulator Placement    Surgery date: 06/19/24 at [facility] (Medical Mall: [address], [place], [place] [address]) - you will find out your arrival time the business day before your surgery.   Pre-op appointment at [facility] Pre-admit Testing: you will receive a call with a date/time for this appointment. If you are scheduled for an in person appointment, Pre-admit Testing is located on the first floor of the [facility] building, [address] [address] [address], [address]. During this appointment, they will advise you which medications you can take the morning of surgery, and which medications you will need to hold for surgery. Labs (such as blood work, EKG) may be done at your pre-op appointment. You are not required to fast for these labs. Should you need to change your pre-op appointment, please call Pre-admit testing at [phone].   Please bring your medication bottles or an up to date medication list to your pre-admit testing appointment (regardless of whether we have a list in your chart).    Diabetes/heart failure/kidney disease/weight loss medications that require an extended hold: Per anesthesia guidelines (due to the increased risk of aspiration caused by delayed gastric emptying):  Semaglutide  (Wegovy ) Injection - hold 7 days prior to surgery    Surgical clearance: we will send a clearance form to Tabitha Dugal, FNP. They may wish to see you in their office prior to signing the clearance form. If so, they may call you to schedule an appointment.     Common restrictions after spine surgery: No bending, lifting, or twisting (BLT). Avoid lifting objects heavier than 10 pounds for the first 6 weeks after surgery. Where possible, avoid household activities that involve lifting, bending, reaching, pushing, or pulling such as  laundry, vacuuming, grocery shopping, and childcare. Try to arrange for help from friends and family for these activities while you heal. Do not drive while taking prescription pain medication. Weeks 6 through 12 after surgery: avoid lifting more than 25 pounds.     How to contact us :  If you have any questions/concerns before or after surgery, you can reach us  at [phone], or you can send a mychart message. We can be reached by phone or mychart 8am-4pm, Monday-Friday.  *Please note: Calls after 4pm are forwarded to a third party answering service. Mychart messages are not routinely monitored during evenings, weekends, and holidays. Please call our office to contact the answering service for urgent concerns during non-business hours.     If you have FMLA/disability paperwork, please drop it off or fax it to [phone]   Appointments/FMLA & disability paperwork: Reche Hait, & Nichole Registered Nurse/Surgery scheduler: Kendelyn, RN & Katie, RN Certified Medical Assistants: Don, CMA, Elenor, CMA, Damien, CMA, & Auston, [place] Physician Assistants: Lyle Decamp, PA-C, Edsel Goods, PA-C & Glade Boys, PA-C Surgeons: Penne Sharps, MD & Reeves Daisy, MD   [facility] PREADMIT TESTING VISIT and SURGERY INFORMATION SHEET   Now that surgery has been scheduled you can anticipate several phone calls from [facility] services. A pharmacy technician will call you to verify your current list of medications taken at home.               The Pre-Service Center will call to verify your insurance information and to give you billing estimates and information.  The Preadmit Testing Office will be calling to schedule a visit to obtain information for the anesthesia team and provide instructions on preparation for surgery.  What can you expect for the Preadmit Testing Visit: Appointments may be scheduled in-person or by telephone.  If a telephone visit is  scheduled, you may be asked to come into the office to have lab tests or other studies performed.   This visit will not be completed any greater than 14 days prior to your surgery.  If your surgery has been scheduled for a future date, please do not be alarmed if we have not contacted you to schedule an appointment more than a month prior to the surgery date.    Please be prepared to provide the following information during this appointment:            -Personal medical history                                               -Medication and allergy list            -Any history of problems with anesthesia              -Recent lab work or diagnostic studies            -Please notify us  of any needs we should be aware of to provide the best care possible           -You will be provided with instructions on how to prepare for your surgery.    On The Day of Surgery:  You must have a driver to take you home after surgery, you will be asked not to drive for 24 hours following surgery.  Taxi, Gisele and non-medical transport will not be acceptable means of transportation unless you have a responsible individual who will be traveling with you.  Visitors in the surgical area:   2 people will be able to visit you in your room once your preparation for surgery has been completed. During surgery, your visitors will be asked to wait in the Surgery Waiting Area.  It is not a requirement for them to stay, if they prefer to leave and come back.  Your visitor(s) will be given an update once the surgery has been completed.  No visitors are allowed in the initial recovery room to respect patient privacy and safety.  Once you are more awake and transfer to the secondary recovery area, or are transferred to an inpatient room, visitors will again be able to see you.  To respect and protect your privacy: We will ask on the day of surgery who your driver will be and what the contact number for that individual will be. We  will ask if it is okay to share information with this individual, or if there is an alternative individual that we, or the surgeon, should contact to provide updates and information. If family or friends come to the surgical information desk requesting information about you, who you have not listed with us , no information will be given.   It may be helpful to designate someone as the main contact who will be responsible for updating your other friends and family.    PREADMIT TESTING OFFICE: [phone] SAME DAY SURGERY: [phone] We look forward to caring for you before and throughout the process of your surgery.

## 2024-06-05 NOTE — Progress Notes (Signed)
 "  Referring Physician:  Corwin Antu, FNP [address] [place] [address] [place],  [place] [address]  Primary Physician:  Corwin Antu, FNP  Discussed the use of AI scribe software for clinical note transcription with the patient, who gave verbal consent to proceed.  History of Present Illness Erin Dorsey is a 54 year old female with lumbar spondylosis and bilateral lower extremity radiculopathy who presents for evaluation of persistent bilateral leg pain. EMG positive chronic radiculopathy  She has bilateral leg pain that has worsened after her lead pull. Back at her baseline with severe cramping.   She previously had a spinal cord stimulator trial placed by Dr. Marcelino with greater than 50% pain relief, but after its removal her pain and nocturnal cramping returned.   She quit smoking in 2021.   Bowel/Bladder Dysfunction: none, some bowel urgency since having her gallbladder removed.   Conservative measures:  Physical therapy: [facility] initial eval 09/10/23 Multimodal medical therapy including regular antiinflammatories: baclofen , gabapentin , prednisone  Injections:  11/08/23  bilateral L3, L4 medial branch and dorsal rami blocks with no significant relief  10/18/23 repeat bilateral L5-S1 TF ESI with celestone  09/27/23  L5-S1 TF ESI with no improvement   Past Surgery: no spinal surgeries  The symptoms are causing a significant impact on the patient's life.   Review of Systems:  A 10 point review of systems is negative, except for the pertinent positives and negatives detailed in the HPI.  Past Medical History: Past Medical History:  Diagnosis Date   Allergy    Anxiety    Cervical cancer (HCC) 2011   stage 3   Chronic headaches    Depression    Edema 10/01/2023   Fatty liver    Frequent falls    GERD (gastroesophageal reflux disease)    Lumbar radiculopathy    Moderate obstructive sleep apnea 07/10/2023    Past Surgical History: Past Surgical History:   Procedure Laterality Date   ABDOMINAL HYSTERECTOMY     Total   ANTERIOR AND POSTERIOR REPAIR N/A 04/12/2020   Procedure: ANTERIOR (CYSTOCELE) AND POSTERIOR REPAIR (RECTOCELE);  Surgeon: Janit Alm Agent, MD;  Location: [facility] ORS;  Service: Gynecology;  Laterality: N/A;   BLADDER SUSPENSION N/A 04/12/2020   Procedure: [facility] PROCEDURE;  Surgeon: Janit Alm Agent, MD;  Location: [facility];  Service: Gynecology;  Laterality: N/A;  TOT   CHOLECYSTECTOMY N/A 11/30/2022   Procedure: LAPAROSCOPIC CHOLECYSTECTOMY;  Surgeon: Dasie Leonor CROME, MD;  Location[facility];  Service: General;  Laterality: N/A;   COLONOSCOPY WITH PROPOFOL  N/A 01/26/2021   Procedure: COLONOSCOPY WITH PROPOFOL ;  Surgeon: Dessa Reyes ORN, MD;  Location: [facility];  Service: Endoscopy;  Laterality: N/A;   COLPOSCOPY  2011 or 2012   stage 3-removed cancerous spot on the cervix   ESOPHAGOGASTRODUODENOSCOPY (EGD) WITH PROPOFOL  N/A 01/26/2021   Procedure: ESOPHAGOGASTRODUODENOSCOPY (EGD) WITH PROPOFOL ;  Surgeon: Dessa Reyes ORN, MD;  Location: [facility] END[facility];  Service: Endoscopy;  Laterality: N/A;   EXCISION OF TONGUE LESION N/A 10/15/2019   Procedure: EXCISION OF TONGUE LESION;  Surgeon: Blair Mt, MD;  Location: [facility];  Service: ENT;  Laterality: N/A;   FOOT SURGERY Right    x 2-took knots out from bottom of the foot-not sure of the technical name   LAPAROSCOPIC VAGINAL HYSTERECTOMY WITH SALPINGO OOPHORECTOMY Bilateral 04/12/2020   Procedure: LAPAROSCOPIC ASSISTED VAGINAL HYSTERECTOMY WITH SALPINGO OOPHORECTOMY;  Surgeon: Janit Alm Agent, MD;  Location: [facility];  Service: Gynecology;  Laterality: Bilateral;   PAROTIDECTOMY Right 10/15/2019  Procedure: PAROTIDECTOMY;  Surgeon: Blair Mt, MD;  Location: [facility];  Service: ENT;  Laterality: Right;   SPINAL CORD STIMULATOR REMOVAL  06/05/2023   SPINAL CORD STIMULATOR TRIAL  05/28/2024   TUBAL LIGATION      Allergies: Allergies as of 06/05/2024 - Review  Complete 06/05/2024  Allergen Reaction Noted   Amoxicillin Rash 10/02/2019   Penicillins Rash 07/22/2015   Tape Rash 06/05/2024   Zithromax  [azithromycin ] Nausea And Vomiting and Rash 02/21/2024    Medications: Outpatient Encounter Medications as of 06/05/2024  Medication Sig   amitriptyline  (ELAVIL ) 25 MG tablet TAKE 1 TABLET BY MOUTH EVERYDAY AT BEDTIME   baclofen  (LIORESAL ) 10 MG tablet TAKE 1 TABLET BY MOUTH TWICE A DAY AS NEEDED FOR MUSCLE SPASMS   busPIRone  (BUSPAR ) 15 MG tablet TAKE 1 TABLET BY MOUTH 3 TIMES DAILY.   cephALEXin  (KEFLEX ) 500 MG capsule Take 1 capsule (500 mg total) by mouth 4 (four) times daily for 7 days.   clotrimazole  (CLOTRIMAZOLE  ANTI-FUNGAL) 1 % cream Apply 1 application twice daily for two weeks   clotrimazole -betamethasone  (LOTRISONE ) cream Apply 1 Application topically daily.   FLUoxetine  HCl 60 MG TABS TAKE 60 MG BY MOUTH DAILY.   fluticasone  (FLONASE ) 50 MCG/ACT nasal spray Place 2 sprays into both nostrils daily.   fluticasone  (FLOVENT  HFA) 110 MCG/ACT inhaler Inhale 1 puff into the lungs in the morning and at bedtime.   furosemide  (LASIX ) 20 MG tablet TAKE 1 TABLET BY MOUTH EVERY DAY   gabapentin  (NEURONTIN ) 400 MG capsule Take 400 mg by mouth 3 (three) times daily.   Hydrocortisone  Acetate 1 % CREA Apply 1 application  topically daily as needed (heat rash).   hydrOXYzine  (ATARAX ) 25 MG tablet TAKE 1 TABLET BY MOUTH EVERYDAY AT BEDTIME   levocetirizine (XYZAL ) 5 MG tablet Take 1 tablet (5 mg total) by mouth every evening.   metroNIDAZOLE  (FLAGYL ) 500 MG tablet Take 1 tablet (500 mg total) by mouth 2 (two) times daily for 7 days.   mupirocin  ointment (BACTROBAN ) 2 % APPLY TO AFFECTED AREA TOPICALLY EVERY DAY   nystatin  (MYCOSTATIN /NYSTOP ) powder APPLY TO AFFECTED AREA 3 TIMES A DAY   omeprazole  (PRILOSEC) 20 MG capsule TAKE 1 CAPSULE BY MOUTH EVERY DAY   ondansetron  (ZOFRAN ) 4 MG tablet TAKE 1 TABLET BY MOUTH EVERY 8 HOURS AS NEEDED FOR NAUSEA AND  VOMITING   pilocarpine  (SALAGEN ) 5 MG tablet Take 1 tablet (5 mg total) by mouth at bedtime as needed.   pravastatin (PRAVACHOL) 10 MG tablet Take 10 mg by mouth at bedtime.   predniSONE  (STERAPRED UNI-PAK 21 TAB) 10 MG (21) TBPK tablet Take as directed   promethazine  (PHENERGAN ) 25 MG tablet Take 25 mg by mouth every 6 (six) hours as needed for nausea or vomiting.   semaglutide -weight management (WEGOVY ) 0.25 MG/0.5ML SOAJ SQ injection Inject 0.25 mg into the skin once a week.   valACYclovir  (VALTREX ) 500 MG tablet TAKE 2 TABLETS BY MOUTH EVERY DAY   VENTOLIN  HFA 108 (90 Base) MCG/ACT inhaler TAKE 2 PUFFS BY MOUTH EVERY 6 HOURS AS NEEDED FOR WHEEZE OR SHORTNESS OF BREATH   No facility-administered encounter medications on file as of 06/05/2024.    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   Drug use: Not Currently    Frequency: 1.0 times per week    Types: Marijuana    Comment: as needed    Family Medical History: Family History  Problem Relation Age of Onset   Hypertension Mother    Anxiety disorder Mother    Arthritis Mother    COPD Mother    Thyroid  disease Mother    Kidney disease Father        was on dialysis   Obesity Father    Uterine cancer Sister    Cervical cancer Sister    ADD / ADHD Brother    Cervical cancer Maternal Grandmother    Cancer Maternal Grandmother    Varicose Veins Maternal Grandmother    Lung cancer Maternal Grandfather    Brain cancer Maternal Grandfather    Cancer Maternal Grandfather    Deafness Daughter    Thyroid  disease  Daughter    ADD / ADHD Daughter    Breast cancer Paternal Aunt     Physical Examination:  Awake, alert, oriented to person, place, and time.  Speech is clear and fluent. Fund of knowledge is appropriate.   Cranial Nerves: Pupils equal round and reactive to light.  Facial tone is symmetric.    No abnormal lesions on exposed skin.   Strength: Side Iliopsoas Quads Hamstring PF DF EHL  R 5 5 5 5 5 5   L 5 5 5 5 5 5    Reflexes are 2+ and symmetric at the patella and achilles.    Clonus is not present.   Bilateral lower extremity sensation is intact to light touch, but diminished mid calf to foot bilaterally.   Gait is normal.   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   Thank you for the referral of this patient. It was our privilege to participate in care of your patient. Feel free to contact us  with any further questions.  _____________________________ Jannett Fairly, M.D.   Narrative & Impression  MR LUMBAR SPINE WITHOUT IV CONTRAST   COMPARISON: 02/08/2021   CLINICAL HISTORY: Low back pain and right radiculopathy.   TECHNIQUE: SAG T2, SAG T1, SAG STIR, AX T2, AX T1 without IV contrast.   FINDINGS: There is stable alignment of the lumbar spine. Stable disc desiccation L5-S1 with mild caudal foraminal narrowing bilaterally. Mild facet arthrosis is present. There is a stable benign hemangioma in the L3 vertebral body. There is no vertebral body height loss, subluxation or marrow replacing process. The sacrum and SI joints are unremarkable so far as visualized. Conus and cauda equina are unremarkable.   T12-L1: There is no focal disc protrusion, foraminal or spinal stenosis.   L1-2: Mild disc desiccation and mild facet arthrosis. No significant  foraminal or spinal stenosis.   L2-3: Mild disc desiccation and mild facet arthrosis. No significant foraminal or spinal stenosis.   L3-4: There is no focal disc protrusion, foraminal or spinal stenosis. Mild-to-moderate facet arthrosis.   L4-5: There is no focal disc protrusion, foraminal or spinal stenosis. Mild-to-moderate facet arthrosis.   L5-S1: Disc desiccation and moderate facet  arthrosis with slight caudal bronchograms bilaterally. No impingement of the exiting L5 nerves. No significant interval change.   The retroperitoneal structures demonstrate no significant abnormality.   IMPRESSION: Stable disc desiccation L5-S1 with mild caudal foraminal narrowing bilaterally secondary to disc osteophyte. No significant interval change. No high-grade spinal or foraminal stenosis.   Electronically signed by: Norleen Satchel MD 08/29/2023 02:22 PM EDT RP Workstation: [id number]   MR THORACIC SPINE WO CONTRAST Result Date: 04/07/2024 EXAM: MRI THORACIC SPINE WITHOUT INTRAVENOUS CONTRAST 04/01/2024 05:47:00 PM TECHNIQUE: Multiplanar multisequence MRI of the thoracic spine was performed without the administration of intravenous contrast. COMPARISON: None available. CLINICAL HISTORY: Evaluate for spinal cord stimulation placement, chronic pain syndrome, chronic mid back pain radiating down the back of the right leg to the foot, lumbar radiculopathy. FINDINGS: BONES AND ALIGNMENT: Normal alignment. Normal vertebral body heights. Bone marrow signal is unremarkable. No abnormal enhancement. SPINAL CORD: Normal spinal cord volume. Normal spinal cord signal. SOFT TISSUES: Unremarkable. DEGENERATIVE CHANGES: Mild disc bulging at multiple levels. No spinal canal stenosis or neural foraminal narrowing. IMPRESSION: 1. No spinal canal stenosis or neural foraminal narrowing in the thoracic spine. Electronically signed by: Gilmore Molt MD 04/07/2024 12:06 AM EST RP Workstation: [id number]   Assessment &  Plan Lumbar spondylosis with bilateral lower extremity radiculopathy Chronic, severe lumbar spondylosis with bilateral lower extremity radiculopathy causing significant functional limitation, with recent worsening of pain and cramping, now more pronounced on the left. Spinal cord stimulator trial provided >50% pain relief and overall functional improvement, but symptoms recurred after removal. Her wide spinal canal anatomy is favorable for paddle lead placement, reducing risk of major complications. Permanent stimulator is expected to improve pain control and function, though not eliminate pain completely, with the goal of reducing analgesic requirements, improving pain control, and improving functional performance. Surgical risks include bleeding, infection, incomplete pain relief, and rare risk of spinal cord injury or paralysis, but her anatomy lowers the likelihood of major complications. Full benefit anticipated after staged postoperative programming. - Planned permanent spinal cord stimulator placement with paddle lead in the thoracic spine via thoracic laminectomy to target bilateral lower extremity symptoms. IPG placement will be performed in tandem, placed in flank. - Confirmed completion of all preoperative testing. - Reviewed surgical risks (bleeding, infection, spinal cord injury/paralysis) and rationale for approach given her anatomy. - Clarified anticipated outcomes and limitations, including staged programming for optimal pain relief.  Penne LELON Sharps MD "

## 2024-06-05 NOTE — Patient Instructions (Signed)
 I recommend symptom treatments for eye dryness including lubricating eye drops and can use gel or ointment based products for overnight.  Also consider use of humidifier at night during dry weather.  Use follow-up regularly with your eye doctor.  If symptoms worsen there are several types of medicated eyedrop that can help with the dryness or inflammation.  For chronic dry mouth is important to stay well-hydrated.  You can also use sugar-free gum or lozenges to stimulate the saliva production.  Biotene mouthwash or lozenges may also be helpful.  Products into XyliMelts also work to stimulate saliva production.  Continue following up with your dentist regularly because dryness problems can increase the risk of tooth and gum decay.  If symptoms get worse there are medications for stimulating tear and saliva production but will try all the other options first.

## 2024-06-06 ENCOUNTER — Encounter: Payer: Self-pay | Admitting: Neurosurgery

## 2024-06-09 ENCOUNTER — Ambulatory Visit: Admitting: Family

## 2024-06-11 ENCOUNTER — Other Ambulatory Visit

## 2024-06-13 ENCOUNTER — Ambulatory Visit (INDEPENDENT_AMBULATORY_CARE_PROVIDER_SITE_OTHER): Admitting: Vascular Surgery

## 2024-06-13 ENCOUNTER — Encounter (INDEPENDENT_AMBULATORY_CARE_PROVIDER_SITE_OTHER)

## 2024-06-19 ENCOUNTER — Ambulatory Visit: Admission: RE | Admit: 2024-06-19 | Admitting: Neurosurgery

## 2024-06-19 ENCOUNTER — Encounter: Admission: RE | Payer: Self-pay

## 2024-06-19 DIAGNOSIS — M51362 Other intervertebral disc degeneration, lumbar region with discogenic back pain and lower extremity pain: Secondary | ICD-10-CM | POA: Insufficient documentation

## 2024-06-19 DIAGNOSIS — G894 Chronic pain syndrome: Secondary | ICD-10-CM | POA: Insufficient documentation

## 2024-06-19 DIAGNOSIS — M5416 Radiculopathy, lumbar region: Secondary | ICD-10-CM | POA: Insufficient documentation

## 2024-06-24 ENCOUNTER — Ambulatory Visit: Admitting: Neurology

## 2024-07-02 ENCOUNTER — Encounter: Admitting: Orthopedic Surgery

## 2024-07-03 ENCOUNTER — Encounter (INDEPENDENT_AMBULATORY_CARE_PROVIDER_SITE_OTHER)

## 2024-07-03 ENCOUNTER — Ambulatory Visit (INDEPENDENT_AMBULATORY_CARE_PROVIDER_SITE_OTHER): Admitting: Nurse Practitioner

## 2024-07-03 ENCOUNTER — Ambulatory Visit: Admitting: Student in an Organized Health Care Education/Training Program

## 2024-07-10 ENCOUNTER — Ambulatory Visit

## 2024-07-30 ENCOUNTER — Encounter: Admitting: Neurosurgery

## 2024-09-02 ENCOUNTER — Ambulatory Visit: Admitting: Internal Medicine
